# Patient Record
Sex: Female | Born: 1980 | Race: White | Hispanic: No | Smoking: Never smoker
Health system: Southern US, Community
[De-identification: ages and names within clinical notes are randomized; demographics above are authoritative.]

## PROBLEM LIST (undated history)

## (undated) ENCOUNTER — Inpatient Hospital Stay (HOSPITAL_COMMUNITY): Payer: Self-pay

## (undated) DIAGNOSIS — G43909 Migraine, unspecified, not intractable, without status migrainosus: Secondary | ICD-10-CM

## (undated) DIAGNOSIS — T7840XA Allergy, unspecified, initial encounter: Secondary | ICD-10-CM

## (undated) DIAGNOSIS — Z973 Presence of spectacles and contact lenses: Secondary | ICD-10-CM

## (undated) DIAGNOSIS — M23221 Derangement of posterior horn of medial meniscus due to old tear or injury, right knee: Secondary | ICD-10-CM

## (undated) DIAGNOSIS — M1990 Unspecified osteoarthritis, unspecified site: Secondary | ICD-10-CM

## (undated) DIAGNOSIS — J302 Other seasonal allergic rhinitis: Secondary | ICD-10-CM

## (undated) DIAGNOSIS — M2241 Chondromalacia patellae, right knee: Secondary | ICD-10-CM

## (undated) DIAGNOSIS — M6751 Plica syndrome, right knee: Secondary | ICD-10-CM

## (undated) HISTORY — DX: Unspecified osteoarthritis, unspecified site: M19.90

## (undated) HISTORY — DX: Allergy, unspecified, initial encounter: T78.40XA

## (undated) HISTORY — PX: TUBAL LIGATION: SHX77

## (undated) HISTORY — PX: WISDOM TOOTH EXTRACTION: SHX21

## (undated) HISTORY — PX: TONSILLECTOMY AND ADENOIDECTOMY: SUR1326

---
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
## 2000-02-01 ENCOUNTER — Emergency Department (HOSPITAL_COMMUNITY): Admission: EM | Admit: 2000-02-01 | Discharge: 2000-02-02 | Payer: Self-pay | Admitting: Emergency Medicine

## 2000-04-26 ENCOUNTER — Emergency Department (HOSPITAL_COMMUNITY): Admission: EM | Admit: 2000-04-26 | Discharge: 2000-04-26 | Payer: Self-pay | Admitting: Emergency Medicine

## 2000-04-26 ENCOUNTER — Encounter: Payer: Self-pay | Admitting: Emergency Medicine

## 2000-06-28 ENCOUNTER — Emergency Department (HOSPITAL_COMMUNITY): Admission: EM | Admit: 2000-06-28 | Discharge: 2000-06-28 | Payer: Self-pay

## 2000-09-09 ENCOUNTER — Encounter: Payer: Self-pay | Admitting: Emergency Medicine

## 2000-09-09 ENCOUNTER — Emergency Department (HOSPITAL_COMMUNITY): Admission: EM | Admit: 2000-09-09 | Discharge: 2000-09-09 | Payer: Self-pay | Admitting: Emergency Medicine

## 2001-02-14 ENCOUNTER — Other Ambulatory Visit: Admission: RE | Admit: 2001-02-14 | Discharge: 2001-02-14 | Payer: Self-pay | Admitting: Internal Medicine

## 2001-11-30 ENCOUNTER — Emergency Department (HOSPITAL_COMMUNITY): Admission: EM | Admit: 2001-11-30 | Discharge: 2001-12-01 | Payer: Self-pay | Admitting: Emergency Medicine

## 2001-12-01 ENCOUNTER — Encounter: Payer: Self-pay | Admitting: Emergency Medicine

## 2002-12-27 ENCOUNTER — Emergency Department (HOSPITAL_COMMUNITY): Admission: EM | Admit: 2002-12-27 | Discharge: 2002-12-27 | Payer: Self-pay | Admitting: Emergency Medicine

## 2003-01-10 ENCOUNTER — Emergency Department (HOSPITAL_COMMUNITY): Admission: EM | Admit: 2003-01-10 | Discharge: 2003-01-10 | Payer: Self-pay | Admitting: Emergency Medicine

## 2003-07-02 ENCOUNTER — Emergency Department (HOSPITAL_COMMUNITY): Admission: EM | Admit: 2003-07-02 | Discharge: 2003-07-02 | Payer: Self-pay | Admitting: Emergency Medicine

## 2003-07-17 ENCOUNTER — Emergency Department (HOSPITAL_COMMUNITY): Admission: EM | Admit: 2003-07-17 | Discharge: 2003-07-17 | Payer: Self-pay | Admitting: Emergency Medicine

## 2003-09-29 ENCOUNTER — Emergency Department (HOSPITAL_COMMUNITY): Admission: EM | Admit: 2003-09-29 | Discharge: 2003-09-29 | Payer: Self-pay | Admitting: Emergency Medicine

## 2003-11-12 HISTORY — PX: CHOLECYSTECTOMY: SHX55

## 2003-11-25 ENCOUNTER — Observation Stay (HOSPITAL_COMMUNITY): Admission: RE | Admit: 2003-11-25 | Discharge: 2003-11-26 | Payer: Self-pay | Admitting: General Surgery

## 2010-10-23 ENCOUNTER — Other Ambulatory Visit: Payer: Self-pay | Admitting: Obstetrics & Gynecology

## 2011-03-16 ENCOUNTER — Other Ambulatory Visit: Payer: Self-pay | Admitting: Obstetrics & Gynecology

## 2013-04-03 ENCOUNTER — Other Ambulatory Visit: Payer: Self-pay | Admitting: Obstetrics & Gynecology

## 2013-08-24 ENCOUNTER — Ambulatory Visit
Admission: RE | Admit: 2013-08-24 | Discharge: 2013-08-24 | Disposition: A | Discharge status: Home / Self Care | Payer: PRIVATE HEALTH INSURANCE | Source: Ambulatory Visit | Attending: Emergency Medicine | Admitting: Emergency Medicine

## 2013-08-24 ENCOUNTER — Other Ambulatory Visit: Payer: Self-pay | Admitting: Emergency Medicine

## 2013-08-24 ENCOUNTER — Other Ambulatory Visit: Payer: Self-pay

## 2013-08-24 DIAGNOSIS — S0003XA Contusion of scalp, initial encounter: Secondary | ICD-10-CM

## 2013-10-03 ENCOUNTER — Other Ambulatory Visit: Payer: Self-pay | Admitting: Sports Medicine

## 2013-10-03 DIAGNOSIS — M25561 Pain in right knee: Secondary | ICD-10-CM

## 2013-10-04 ENCOUNTER — Ambulatory Visit
Admission: RE | Admit: 2013-10-04 | Discharge: 2013-10-04 | Disposition: A | Discharge status: Home / Self Care | Payer: PRIVATE HEALTH INSURANCE | Source: Ambulatory Visit | Attending: Sports Medicine | Admitting: Sports Medicine

## 2013-10-04 DIAGNOSIS — M25561 Pain in right knee: Secondary | ICD-10-CM

## 2013-11-20 ENCOUNTER — Ambulatory Visit: Payer: Self-pay | Admitting: Emergency Medicine

## 2013-11-20 LAB — PREGNANCY, URINE: PREGNANCY TEST, URINE: NEGATIVE m[IU]/mL

## 2014-01-02 ENCOUNTER — Ambulatory Visit: Payer: Self-pay | Admitting: Physician Assistant

## 2014-01-02 LAB — CBC WITH DIFFERENTIAL/PLATELET
Basophil #: 0.1 10*3/uL (ref 0.0–0.1)
Basophil %: 0.9 %
Eosinophil #: 0.4 10*3/uL (ref 0.0–0.7)
Eosinophil %: 4 %
HCT: 43.6 % (ref 35.0–47.0)
HGB: 14.5 g/dL (ref 12.0–16.0)
LYMPHS PCT: 25.8 %
Lymphocyte #: 2.4 10*3/uL (ref 1.0–3.6)
MCH: 29.9 pg (ref 26.0–34.0)
MCHC: 33.3 g/dL (ref 32.0–36.0)
MCV: 90 fL (ref 80–100)
MONO ABS: 0.7 x10 3/mm (ref 0.2–0.9)
Monocyte %: 7.8 %
Neutrophil #: 5.9 10*3/uL (ref 1.4–6.5)
Neutrophil %: 61.5 %
PLATELETS: 280 10*3/uL (ref 150–440)
RBC: 4.86 10*6/uL (ref 3.80–5.20)
RDW: 12.7 % (ref 11.5–14.5)
WBC: 9.5 10*3/uL (ref 3.6–11.0)

## 2014-01-02 LAB — URINALYSIS, COMPLETE
BILIRUBIN, UR: NEGATIVE
Blood: NEGATIVE
GLUCOSE, UR: NEGATIVE mg/dL (ref 0–75)
KETONE: NEGATIVE
Nitrite: POSITIVE
Ph: 6 (ref 4.5–8.0)
RBC, UR: NONE SEEN /HPF (ref 0–5)
SPECIFIC GRAVITY: 1.02 (ref 1.003–1.030)

## 2014-01-02 LAB — COMPREHENSIVE METABOLIC PANEL
ALT: 18 U/L (ref 12–78)
Albumin: 3.4 g/dL (ref 3.4–5.0)
Alkaline Phosphatase: 43 U/L — ABNORMAL LOW
Anion Gap: 9 (ref 7–16)
BILIRUBIN TOTAL: 0.5 mg/dL (ref 0.2–1.0)
BUN: 9 mg/dL (ref 7–18)
CO2: 26 mmol/L (ref 21–32)
Calcium, Total: 8.8 mg/dL (ref 8.5–10.1)
Chloride: 102 mmol/L (ref 98–107)
Creatinine: 0.81 mg/dL (ref 0.60–1.30)
EGFR (African American): >60
EGFR (Non-African Amer.): >60
Glucose: 105 mg/dL — ABNORMAL HIGH (ref 65–99)
Osmolality: 273 (ref 275–301)
Potassium: 3.9 mmol/L (ref 3.5–5.1)
SGOT(AST): 14 U/L — ABNORMAL LOW (ref 15–37)
Sodium: 137 mmol/L (ref 136–145)
TOTAL PROTEIN: 7.3 g/dL (ref 6.4–8.2)

## 2014-01-02 LAB — LIPASE, BLOOD: LIPASE: 164 U/L (ref 73–393)

## 2014-01-02 LAB — PREGNANCY, URINE: Pregnancy Test, Urine: NEGATIVE m[IU]/mL

## 2014-01-02 LAB — AMYLASE: AMYLASE: 53 U/L (ref 25–115)

## 2014-01-04 LAB — URINE CULTURE

## 2014-04-01 ENCOUNTER — Encounter (HOSPITAL_COMMUNITY): Payer: Self-pay | Admitting: Emergency Medicine

## 2014-04-01 DIAGNOSIS — Z9089 Acquired absence of other organs: Secondary | ICD-10-CM | POA: Insufficient documentation

## 2014-04-01 DIAGNOSIS — R1012 Left upper quadrant pain: Secondary | ICD-10-CM | POA: Insufficient documentation

## 2014-04-01 DIAGNOSIS — E860 Dehydration: Secondary | ICD-10-CM | POA: Insufficient documentation

## 2014-04-01 DIAGNOSIS — Z79899 Other long term (current) drug therapy: Secondary | ICD-10-CM | POA: Insufficient documentation

## 2014-04-01 DIAGNOSIS — Z791 Long term (current) use of non-steroidal anti-inflammatories (NSAID): Secondary | ICD-10-CM | POA: Insufficient documentation

## 2014-04-01 DIAGNOSIS — R1013 Epigastric pain: Secondary | ICD-10-CM | POA: Insufficient documentation

## 2014-04-01 DIAGNOSIS — Z3202 Encounter for pregnancy test, result negative: Secondary | ICD-10-CM | POA: Insufficient documentation

## 2014-04-01 DIAGNOSIS — Z87891 Personal history of nicotine dependence: Secondary | ICD-10-CM | POA: Insufficient documentation

## 2014-04-01 DIAGNOSIS — Z880 Allergy status to penicillin: Secondary | ICD-10-CM | POA: Insufficient documentation

## 2014-04-01 LAB — URINALYSIS, ROUTINE W REFLEX MICROSCOPIC
Bilirubin Urine: NEGATIVE
Glucose, UA: NEGATIVE mg/dL
Hgb urine dipstick: NEGATIVE
Ketones, ur: NEGATIVE mg/dL
Leukocytes, UA: NEGATIVE
Nitrite: NEGATIVE
PROTEIN: NEGATIVE mg/dL
SPECIFIC GRAVITY, URINE: 1.028 (ref 1.005–1.030)
Urobilinogen, UA: 0.2 mg/dL (ref 0.0–1.0)
pH: 6 (ref 5.0–8.0)

## 2014-04-01 LAB — COMPREHENSIVE METABOLIC PANEL
ALT: 11 U/L (ref 0–35)
AST: 16 U/L (ref 0–37)
Albumin: 3.2 g/dL — ABNORMAL LOW (ref 3.5–5.2)
Alkaline Phosphatase: 42 U/L (ref 39–117)
Anion gap: 10 (ref 5–15)
BUN: 10 mg/dL (ref 6–23)
CO2: 25 mEq/L (ref 19–32)
CREATININE: 0.83 mg/dL (ref 0.50–1.10)
Calcium: 9.1 mg/dL (ref 8.4–10.5)
Chloride: 105 mEq/L (ref 96–112)
GFR calc Af Amer: >90 mL/min (ref >90)
GFR calc non Af Amer: >90 mL/min (ref >90)
Glucose, Bld: 107 mg/dL — ABNORMAL HIGH (ref 70–99)
POTASSIUM: 4.4 meq/L (ref 3.7–5.3)
Sodium: 140 mEq/L (ref 137–147)
Total Bilirubin: 0.2 mg/dL — ABNORMAL LOW (ref 0.3–1.2)
Total Protein: 6.9 g/dL (ref 6.0–8.3)

## 2014-04-01 LAB — CBC WITH DIFFERENTIAL/PLATELET
BASOS PCT: 0 % (ref 0–1)
Basophils Absolute: 0 10*3/uL (ref 0.0–0.1)
EOS PCT: 3 % (ref 0–5)
Eosinophils Absolute: 0.4 10*3/uL (ref 0.0–0.7)
HEMATOCRIT: 37.1 % (ref 36.0–46.0)
Hemoglobin: 12.3 g/dL (ref 12.0–15.0)
Lymphocytes Relative: 30 % (ref 12–46)
Lymphs Abs: 3.5 10*3/uL (ref 0.7–4.0)
MCH: 29.1 pg (ref 26.0–34.0)
MCHC: 33.2 g/dL (ref 30.0–36.0)
MCV: 87.7 fL (ref 78.0–100.0)
MONO ABS: 1 10*3/uL (ref 0.1–1.0)
Monocytes Relative: 8 % (ref 3–12)
Neutro Abs: 6.8 10*3/uL (ref 1.7–7.7)
Neutrophils Relative %: 59 % (ref 43–77)
Platelets: 272 10*3/uL (ref 150–400)
RBC: 4.23 MIL/uL (ref 3.87–5.11)
RDW: 12.8 % (ref 11.5–15.5)
WBC: 11.6 10*3/uL — AB (ref 4.0–10.5)

## 2014-04-01 LAB — LIPASE, BLOOD: LIPASE: 63 U/L — AB (ref 11–59)

## 2014-04-01 LAB — POC URINE PREG, ED: PREG TEST UR: NEGATIVE

## 2014-04-01 NOTE — ED Notes (Signed)
Pt denies any dysuria, hematuria, discharge or foul odors.

## 2014-04-01 NOTE — ED Notes (Signed)
Pt c/o abdominal pain, n/v for four days. Pt reports emesis x 2 and diarrhea for a week. LMP was six months ago with negative home pregnancy test.

## 2014-04-02 ENCOUNTER — Emergency Department (HOSPITAL_COMMUNITY)
Admission: EM | Admit: 2014-04-02 | Discharge: 2014-04-02 | Disposition: A | Discharge status: Home / Self Care | Payer: PRIVATE HEALTH INSURANCE | Attending: Emergency Medicine | Admitting: Emergency Medicine

## 2014-04-02 DIAGNOSIS — R1013 Epigastric pain: Secondary | ICD-10-CM

## 2014-04-02 MED ORDER — METRONIDAZOLE 500 MG PO TABS: 500.0000 mg | ORAL_TABLET | Freq: Two times a day (BID) | ORAL | Status: DC

## 2014-04-02 MED ORDER — ONDANSETRON 4 MG PO TBDP: 4.0000 mg | ORAL_TABLET | Freq: Three times a day (TID) | ORAL | Status: DC | PRN

## 2014-04-02 MED ORDER — CIPROFLOXACIN HCL 500 MG PO TABS: 500.0000 mg | ORAL_TABLET | Freq: Two times a day (BID) | ORAL | Status: DC

## 2014-04-02 MED ORDER — GI COCKTAIL ~~LOC~~
30.0000 mL | Freq: Once | ORAL | Status: AC
  Administered 2014-04-02: 30 mL via ORAL
  Filled 2014-04-02: qty 30

## 2014-04-02 MED ORDER — FAMOTIDINE 20 MG PO TABS: 20.0000 mg | ORAL_TABLET | Freq: Two times a day (BID) | ORAL | Status: DC

## 2014-04-02 MED ORDER — ONDANSETRON 4 MG PO TBDP
4.0000 mg | ORAL_TABLET | Freq: Once | ORAL | Status: AC
  Administered 2014-04-02: 4 mg via ORAL
  Filled 2014-04-02: qty 1

## 2014-04-02 NOTE — ED Notes (Signed)
Pt placed in gown and in bed. PT monitored by pulse ox, bp cuff, and 5-lead. 

## 2014-04-02 NOTE — ED Provider Notes (Signed)
CSN: [id number]     Arrival date & time 04/01/14  2224 History   First MD Initiated Contact with Patient 04/02/14 [phone]     Chief Complaint  Patient presents with  . Abdominal Pain     (Consider location/radiation/quality/duration/timing/severity/associated sxs/prior Treatment) HPI Comments: 33 year old female with a history of cholecystectomy 10 years ago and no other medical problems who reports that she has had abdominal pain with nausea for the last 3 or 4 days, this is gradually getting worse, this evening it became severe, she took some TUMS and had some improvement in her pain. She did have 2 episodes of vomiting and has had loose stools for the last week. She has not had a menstrual cycle in over 6 months but states that she has been on some other medication such as Zoloft which she has recently stopped taking. She is on Mobic frequently for aches and pains. She denies fevers, chills, cough, shortness of breath or back pain. She denies any vaginal discharge, dysuria or hematuria. This pain feels like a sharp pain like someone is "punching her in the stomach"  Patient is a 33 y.o. female presenting with abdominal pain. The history is provided by the patient and the spouse.  Abdominal Pain   History reviewed. No pertinent past medical history. Past Surgical History  Procedure Laterality Date  . Tonsillectomy    . Knee surgery    . Cholecystectomy    . Wisdom tooth extraction     History reviewed. No pertinent family history. History  Substance Use Topics  . Smoking status: Former Games developer  . Smokeless tobacco: Never Used  . Alcohol Use: Yes     Comment: holidays   OB History   Grav Para Term Preterm Abortions TAB SAB Ect Mult Living                 Review of Systems  Gastrointestinal: Positive for abdominal pain.  All other systems reviewed and are negative.     Allergies  Penicillins  Home Medications   Prior to Admission medications   Medication Sig Start Date  End Date Taking? Authorizing Provider  fexofenadine (ALLEGRA) 180 MG tablet Take 180 mg by mouth daily.   Yes Historical Provider, MD  meloxicam (MOBIC) 15 MG tablet Take 15 mg by mouth daily.   Yes Historical Provider, MD  Norethindrone-Mestranol (NECON 1/50, 28,) 1-50 MG-MCG tablet Take 1 tablet by mouth daily.   Yes Historical Provider, MD  ciprofloxacin (CIPRO) 500 MG tablet Take 1 tablet (500 mg total) by mouth every 12 (twelve) hours. 04/02/14   Vida Roller, MD  famotidine (PEPCID) 20 MG tablet Take 1 tablet (20 mg total) by mouth 2 (two) times daily. 04/02/14   Vida Roller, MD  metroNIDAZOLE (FLAGYL) 500 MG tablet Take 1 tablet (500 mg total) by mouth 2 (two) times daily. 04/02/14   Vida Roller, MD  ondansetron (ZOFRAN ODT) 4 MG disintegrating tablet Take 1 tablet (4 mg total) by mouth every 8 (eight) hours as needed for nausea. 04/02/14   Vida Roller, MD   BP 117/68  Pulse 65  Temp(Src) 98.7 F (37.1 C) (Oral)  Resp 15  Ht 5\' 6"  (1.676 m)  Wt 235 lb (106.595 kg)  BMI 37.95 kg/m2  SpO2 98%  LMP 10/22/2013 Physical Exam  Nursing note and vitals reviewed. Constitutional: She appears well-developed and well-nourished. No distress.  HENT:  Head: Normocephalic and atraumatic.  Mouth/Throat: Oropharynx is clear and moist. No oropharyngeal exudate.  Eyes: Conjunctivae and EOM are normal. Pupils are equal, round, and reactive to light. Right eye exhibits no discharge. Left eye exhibits no discharge. No scleral icterus.  Neck: Normal range of motion. Neck supple. No JVD present. No thyromegaly present.  Cardiovascular: Normal rate, regular rhythm, normal heart sounds and intact distal pulses.  Exam reveals no gallop and no friction rub.   No murmur heard. Pulmonary/Chest: Effort normal and breath sounds normal. No respiratory distress. She has no wheezes. She has no rales.  Abdominal: Soft. Bowel sounds are normal. She exhibits no distension and no mass. There is tenderness (mild  left upper quadrant and epigastric tenderness, no guarding, no peritoneal signs, no masses, very soft).  Musculoskeletal: Normal range of motion. She exhibits no edema and no tenderness.  Lymphadenopathy:    She has no cervical adenopathy.  Neurological: She is alert. Coordination normal.  Skin: Skin is warm and dry. No rash noted. No erythema.  Psychiatric: She has a normal mood and affect. Her behavior is normal.    ED Course  Procedures (including critical care time) Labs Review Labs Reviewed  CBC WITH DIFFERENTIAL - Abnormal; Notable for the following:    WBC 11.6 (*)    All other components within normal limits  COMPREHENSIVE METABOLIC PANEL - Abnormal; Notable for the following:    Glucose, Bld 107 (*)    Albumin 3.2 (*)    Total Bilirubin 0.2 (*)    All other components within normal limits  LIPASE, BLOOD - Abnormal; Notable for the following:    Lipase 63 (*)    All other components within normal limits  URINALYSIS, ROUTINE W REFLEX MICROSCOPIC  POC URINE PREG, ED    Imaging Review No results found.    MDM   Final diagnoses:  Epigastric pain    The patient states she is able to drink plenty of fluids and feels she is dehydrated, labs are unremarkable except for a slight leukocytosis of 11,600. Normal liver function tests, normal lipase, urinalysis without ketones or infection and not pregnant. Will try a GI cocktail, antiemetic, if still having significant pain may require CT scan though this would be low yield in the setting of a above labs values. She states that she has not wanted a CT scan here because of cost and would rather do it as an outpatient. She may have early colitis and would benefit from antibiotics.  The patient's symptoms have improved after GI cocktail, will start on Pepcid, we'll also treat for potential colitis causing her symptoms, she declined CT scan and can followup as an outpatient  Patient feels good enough to go home, states her pain is  improved   Meds given in ED:  Medications  gi cocktail (Maalox,Lidocaine,Donnatal) (30 mLs Oral Given 04/02/14 0253)  ondansetron (ZOFRAN-ODT) disintegrating tablet 4 mg (4 mg Oral Given 04/02/14 0253)    New Prescriptions   CIPROFLOXACIN (CIPRO) 500 MG TABLET    Take 1 tablet (500 mg total) by mouth every 12 (twelve) hours.   FAMOTIDINE (PEPCID) 20 MG TABLET    Take 1 tablet (20 mg total) by mouth 2 (two) times daily.   METRONIDAZOLE (FLAGYL) 500 MG TABLET    Take 1 tablet (500 mg total) by mouth 2 (two) times daily.   ONDANSETRON (ZOFRAN ODT) 4 MG DISINTEGRATING TABLET    Take 1 tablet (4 mg total) by mouth every 8 (eight) hours as needed for nausea.      Vida RollerBrian D Daleysa Kristiansen, MD 04/02/14 [id number]

## 2014-04-02 NOTE — ED Notes (Signed)
Family at bedside. 

## 2014-04-02 NOTE — ED Notes (Signed)
Pt's visitor came to the nurse's station stating that the pt is feeling better and would like to go home. Hyacinth MeekerMiller, MD is aware.

## 2014-04-09 ENCOUNTER — Other Ambulatory Visit: Payer: Self-pay | Admitting: Obstetrics & Gynecology

## 2014-04-10 ENCOUNTER — Ambulatory Visit (INDEPENDENT_AMBULATORY_CARE_PROVIDER_SITE_OTHER): Payer: PRIVATE HEALTH INSURANCE | Admitting: Internal Medicine

## 2014-04-10 ENCOUNTER — Encounter: Payer: Self-pay | Admitting: Internal Medicine

## 2014-04-10 VITALS — BP 126/74 | HR 97 | Temp 98.0°F | Ht 64.5 in | Wt 242.0 lb

## 2014-04-10 DIAGNOSIS — K2970 Gastritis, unspecified, without bleeding: Secondary | ICD-10-CM

## 2014-04-10 DIAGNOSIS — J3089 Other allergic rhinitis: Secondary | ICD-10-CM

## 2014-04-10 DIAGNOSIS — K2990 Gastroduodenitis, unspecified, without bleeding: Secondary | ICD-10-CM

## 2014-04-10 LAB — CYTOLOGY - PAP

## 2014-04-10 MED ORDER — FEXOFENADINE HCL 180 MG PO TABS: 180.0000 mg | ORAL_TABLET | Freq: Two times a day (BID) | ORAL | Status: DC

## 2014-04-10 MED ORDER — PANTOPRAZOLE SODIUM 40 MG PO TBEC: 40.0000 mg | DELAYED_RELEASE_TABLET | Freq: Every day | ORAL | Status: DC

## 2014-04-10 NOTE — Progress Notes (Signed)
HPI  Pt presents to the clinic today to establish care. She is transferring care from [facility].  Flu: never Tetanus: less than 10 years ago LMP: 09/2013 (continuous OCP) Pap Smear: 03/2014 Dentist: biannually  Frequent Headaches: once every few weeks  Epigastric Pain, started a few weeks ago. Was seen in the ER for the same. They gave her a GI cocktail which she was improved. They treated her with Cipro/Flagyl for possible colitis. She continues have the pain, nauuea and bloating at times. She is taking the pepcid. It does seem to be worse after spicy food. She is having normal BM.  Past Medical History  Diagnosis Date  . Chicken pox   . Allergy   . Frequent headaches     Current Outpatient Prescriptions  Medication Sig Dispense Refill  . ciprofloxacin (CIPRO) 500 MG tablet Take 1 tablet (500 mg total) by mouth every 12 (twelve) hours.  20 tablet  0  . famotidine (PEPCID) 20 MG tablet Take 1 tablet (20 mg total) by mouth 2 (two) times daily.  30 tablet  0  . fexofenadine (ALLEGRA) 180 MG tablet Take 180 mg by mouth daily.      . meloxicam (MOBIC) 15 MG tablet Take 15 mg by mouth daily.      . metroNIDAZOLE (FLAGYL) 500 MG tablet Take 1 tablet (500 mg total) by mouth 2 (two) times daily.  20 tablet  0  . Norethindrone-Mestranol (NECON 1/50, 28,) 1-50 MG-MCG tablet Take 1 tablet by mouth daily.      . ondansetron (ZOFRAN ODT) 4 MG disintegrating tablet Take 1 tablet (4 mg total) by mouth every 8 (eight) hours as needed for nausea.  10 tablet  0   No current facility-administered medications for this visit.    Allergies  Allergen Reactions  . Penicillins Anaphylaxis    Family History  Problem Relation Age of Onset  . Arthritis Mother   . Cancer Mother     Ovarian/uterine  . Hyperlipidemia Mother   . Hypertension Mother   . Arthritis Father   . Hypertension Father   . Asthma Father   . Asthma Sister   . Arthritis Paternal Grandmother     History   Social  History  . Marital Status: Divorced    Spouse Name: N/A    Number of Children: N/A  . Years of Education: N/A   Occupational History  . Not on file.   Social History Main Topics  . Smoking status: Never Smoker   . Smokeless tobacco: Never Used  . Alcohol Use: Yes     Comment: rare  . Drug Use: No  . Sexual Activity: Not on file   Other Topics Concern  . Not on file   Social History Narrative  . No narrative on file    ROS:  Constitutional: Denies fever, malaise, fatigue, headache or abrupt weight changes.  Respiratory: Denies difficulty breathing, shortness of breath, cough or sputum production.   Cardiovascular: Denies chest pain, chest tightness, palpitations or swelling in the hands or feet.  Gastrointestinal: Pt reports epigastric pain. Denies bloating, constipation, diarrhea or blood in the stool.   No other specific complaints in a complete review of systems (except as listed in HPI above).  PE:  BP 126/74  Pulse 97  Temp(Src) 98 F (36.7 C) (Oral)  Ht 5' 4.5" (1.638 m)  Wt 242 lb (109.77 kg)  BMI 40.91 kg/m2  SpO2 98%  LMP 10/22/2013 Wt Readings from Last 3 Encounters:  04/10/14  242 lb (109.77 kg)  04/01/14 235 lb (106.595 kg)    General: Appears her stated age, obese but well developed, well nourished in NAD. HEENT: Head: normal shape and size; Eyes: sclera white, no icterus, conjunctiva pink, PERRLA and EOMs intact; Ears: Tm's gray and intact, normal light reflex; Nose: mucosa pink and moist, septum midline; Throat/Mouth: Teeth present, mucosa pink and moist, no lesions or ulcerations noted.  Cardiovascular: Normal rate and rhythm. S1,S2 noted.  No murmur, rubs or gallops noted. No JVD or BLE edema. No carotid bruits noted. Pulmonary/Chest: Normal effort and positive vesicular breath sounds. No respiratory distress. No wheezes, rales or ronchi noted.  Abdomen: Soft and nontender. Normal bowel sounds, no bruits noted. No distention or masses noted. Liver,  spleen and kidneys non palpable.   BMET    Component Value Date/Time   NA 140 04/01/2014 2234   K 4.4 04/01/2014 2234   CL 105 04/01/2014 2234   CO2 25 04/01/2014 2234   GLUCOSE 107* 04/01/2014 2234   BUN 10 04/01/2014 2234   CREATININE 0.83 04/01/2014 2234   CALCIUM 9.1 04/01/2014 2234   GFRNONAA >90 04/01/2014 2234   GFRAA >90 04/01/2014 2234    Lipid Panel  No results found for this basename: chol, trig, hdl, cholhdl, vldl, ldlcalc    CBC    Component Value Date/Time   WBC 11.6* 04/01/2014 2234   RBC 4.23 04/01/2014 2234   HGB 12.3 04/01/2014 2234   HCT 37.1 04/01/2014 2234   PLT 272 04/01/2014 2234   MCV 87.7 04/01/2014 2234   MCH 29.1 04/01/2014 2234   MCHC 33.2 04/01/2014 2234   RDW 12.8 04/01/2014 2234   LYMPHSABS 3.5 04/01/2014 2234   MONOABS 1.0 04/01/2014 2234   EOSABS 0.4 04/01/2014 2234   BASOSABS 0.0 04/01/2014 2234    Hgb A1C No results found for this basename: HGBA1C     Assessment and Plan:  Allergic Rhinitis:  Allegra refilled  Epigastric pain, ? Colitis:  Will switch from Pepcid to Protonix If no improvement in 2 weeks will obtain CT scan abdomen  RTC when convenient to set up a physical exam

## 2014-04-10 NOTE — Progress Notes (Signed)
Pre visit review using our clinic review tool, if applicable. No additional management support is needed unless otherwise documented below in the visit note. 

## 2014-04-10 NOTE — Patient Instructions (Addendum)

## 2014-04-11 ENCOUNTER — Telehealth: Payer: Self-pay | Admitting: *Deleted

## 2014-04-11 ENCOUNTER — Telehealth: Payer: Self-pay

## 2014-04-11 NOTE — Telephone Encounter (Signed)
i called number given from [facility] to initiate ZO--[id number]--JPA--[id number]--i was told they would fax the neccessary form needed for PA--waiting on fax

## 2014-04-11 NOTE — Telephone Encounter (Signed)
Pt left message with Triage: Pt was seen by Nicki Reaperegina Baity, NP yesterday and she refilled her Rxs to the wrong pharmacy, [facility] [address] [facility]. When pt went to have [facility] Rxs [facility] [address] [facility] told [facility] that they can't fill pt's protonix until we do a PA on Rx, fax from pharmacy received and [facility], beside Triage, pt request call back once PA is done

## 2014-04-12 NOTE — Telephone Encounter (Signed)
PA form was faxed to [facility] [id number]--[id number]--phone # is [phone]--waiting on response

## 2014-04-22 NOTE — Telephone Encounter (Signed)
Received notification of approval on Protonix approved x 1 year from 04/16/2014--left detailed msg per HIPAA on  VM letting pt know Rx was approved

## 2014-05-21 ENCOUNTER — Encounter: Payer: Self-pay | Admitting: Internal Medicine

## 2014-05-21 ENCOUNTER — Ambulatory Visit (INDEPENDENT_AMBULATORY_CARE_PROVIDER_SITE_OTHER): Payer: PRIVATE HEALTH INSURANCE | Admitting: Internal Medicine

## 2014-05-21 VITALS — BP 120/82 | HR 88 | Temp 98.1°F | Wt 240.2 lb

## 2014-05-21 DIAGNOSIS — J309 Allergic rhinitis, unspecified: Secondary | ICD-10-CM

## 2014-05-21 MED ORDER — PREDNISONE 10 MG PO TABS: ORAL_TABLET | ORAL | Status: DC

## 2014-05-21 NOTE — Progress Notes (Signed)
Subjective:    Patient ID: Mikayla Briggs, female    DOB: [birth year], 33 y.o.   MRN: [id number]  HPI  Patient presents with worsening allergy symptoms for the past three weeks. She reports difficulty breathing   She has taken Aleve D with some relief and has used her inhaler every night with some relief for the past week.  Review of Systems      Past Medical History  Diagnosis Date  . Chicken pox   . Allergy   . Frequent headaches     Current Outpatient Prescriptions  Medication Sig Dispense Refill  . fexofenadine (ALLEGRA) 180 MG tablet Take 1 tablet (180 mg total) by mouth 2 (two) times daily.  100 tablet  2  . meloxicam (MOBIC) 15 MG tablet Take 15 mg by mouth daily.      . Norethindrone-Mestranol (NECON 1/50, 28,) 1-50 MG-MCG tablet Take 1 tablet by mouth daily.       No current facility-administered medications for this visit.    Allergies  Allergen Reactions  . Penicillins Anaphylaxis    Family History  Problem Relation Age of Onset  . Arthritis Mother   . Cancer Mother     Ovarian/uterine  . Hyperlipidemia Mother   . Hypertension Mother   . Arthritis Father   . Hypertension Father   . Asthma Father   . Asthma Sister   . Arthritis Paternal Grandmother     History   Social History  . Marital Status: Divorced    Spouse Name: N/A    Number of Children: N/A  . Years of Education: N/A   Occupational History  . Not on file.   Social History Main Topics  . Smoking status: Never Smoker   . Smokeless tobacco: Never Used  . Alcohol Use: Yes     Comment: rare  . Drug Use: No  . Sexual Activity: Yes   Other Topics Concern  . Not on file   Social History Narrative  . No narrative on file     Constitutional: Denies fever, malaise, fatigue, headache or abrupt weight changes.  HEENT: Denies eye pain, eye redness, ear pain, ringing in the ears, wax buildup, runny nose, bloody nose, or sore throat. Respiratory: Denies shortness of breath, cough or  sputum production.   Cardiovascular: Denies chest pain, No other specific complaints in a complete review of systems (except as listed in HPI above).  Objective:   Physical Exam  BP 120/82  Pulse 88  Temp(Src) 98.1 F (36.7 C) (Oral)  Wt 240 lb 4 oz (108.977 kg)  SpO2 98% Wt Readings from Last 3 Encounters:  05/21/14 240 lb 4 oz (108.977 kg)  04/10/14 242 lb (109.77 kg)  04/01/14 235 lb (106.595 kg)    General: Appears their stated age, well developed, well nourished in NAD. HEENT: Post nasal drip noted. Ears: Tm's gray and intact, normal light reflex; Nose: mucosa pink and moist, septum midline; Throat/Mouth: Teeth present, mucosa pink and moist, no exudate, lesions or ulcerations noted.  Cardiovascular: Normal rate and rhythm. S1,S2 noted.  No murmur, rubs or gallops noted. Pulmonary/Chest: Normal effort and positive vesicular breath sounds. No respiratory distress. Inspiratory wheezes noted in right upper lung   BMET    Component Value Date/Time   NA 140 04/01/2014 2234   K 4.4 04/01/2014 2234   CL 105 04/01/2014 2234   CO2 25 04/01/2014 2234   GLUCOSE 107* 04/01/2014 2234   BUN 10 04/01/2014 2234   CREATININE 0.83  04/01/2014 2234   CALCIUM 9.1 04/01/2014 2234   GFRNONAA >90 04/01/2014 2234   GFRAA >90 04/01/2014 2234    Lipid Panel  No results found for this basename: chol, trig, hdl, cholhdl, vldl, ldlcalc    CBC    Component Value Date/Time   WBC 11.6* 04/01/2014 2234   RBC 4.23 04/01/2014 2234   HGB 12.3 04/01/2014 2234   HCT 37.1 04/01/2014 2234   PLT 272 04/01/2014 2234   MCV 87.7 04/01/2014 2234   MCH 29.1 04/01/2014 2234   MCHC 33.2 04/01/2014 2234   RDW 12.8 04/01/2014 2234   LYMPHSABS 3.5 04/01/2014 2234   MONOABS 1.0 04/01/2014 2234   EOSABS 0.4 04/01/2014 2234   BASOSABS 0.0 04/01/2014 2234    Hgb A1C No results found for this basename: HGBA1C         Assessment & Plan:  Allergic Rhinitis Patient was given  80 mg Depo-Medro  IM  in the office Prednisone  taper pack Please call the office if your symptoms worsen or do not improve

## 2014-05-21 NOTE — Patient Instructions (Addendum)

## 2014-05-21 NOTE — Progress Notes (Signed)
Pre visit review using our clinic review tool, if applicable. No additional management support is needed unless otherwise documented below in the visit note. 

## 2014-05-21 NOTE — Progress Notes (Signed)
Subjective:    Patient ID: Mikayla Briggs, female    DOB: 02/26/1981, 33 y.o.   MRN: [id number]  HPI  Pt presents to the clinic today with c/o seasonal allergies. This started 3 weeks ago. She c/o nasal and chest congestion. She denies cough but has had chest congestion. She denies fever, chills or body aches. She is not blowing colored mucous out of her nose. She is taking Allegra daily. She has also taken Aleve D and her inhaler. She reports that usually when she has a flare of her allergies, she will get relief from a Kenalog injection and prednisone taper.  Review of Systems      Past Medical History  Diagnosis Date  . Chicken pox   . Allergy   . Frequent headaches     Current Outpatient Prescriptions  Medication Sig Dispense Refill  . fexofenadine (ALLEGRA) 180 MG tablet Take 1 tablet (180 mg total) by mouth 2 (two) times daily.  100 tablet  2  . meloxicam (MOBIC) 15 MG tablet Take 15 mg by mouth daily.      . metroNIDAZOLE (FLAGYL) 500 MG tablet Take 1 tablet (500 mg total) by mouth 2 (two) times daily.  20 tablet  0  . Norethindrone-Mestranol (NECON 1/50, 28,) 1-50 MG-MCG tablet Take 1 tablet by mouth daily.      . ondansetron (ZOFRAN ODT) 4 MG disintegrating tablet Take 1 tablet (4 mg total) by mouth every 8 (eight) hours as needed for nausea.  10 tablet  0  . pantoprazole (PROTONIX) 40 MG tablet Take 1 tablet (40 mg total) by mouth daily.  30 tablet  3   No current facility-administered medications for this visit.    Allergies  Allergen Reactions  . Penicillins Anaphylaxis    Family History  Problem Relation Age of Onset  . Arthritis Mother   . Cancer Mother     Ovarian/uterine  . Hyperlipidemia Mother   . Hypertension Mother   . Arthritis Father   . Hypertension Father   . Asthma Father   . Asthma Sister   . Arthritis Paternal Grandmother     History   Social History  . Marital Status: Divorced    Spouse Name: N/A    Number of Children: N/A  .  Years of Education: N/A   Occupational History  . Not on file.   Social History Main Topics  . Smoking status: Never Smoker   . Smokeless tobacco: Never Used  . Alcohol Use: Yes     Comment: rare  . Drug Use: No  . Sexual Activity: Yes   Other Topics Concern  . Not on file   Social History Narrative  . No narrative on file     Constitutional: Denies fever, malaise, fatigue, headache or abrupt weight changes.  HEENT: Pt reports nasal congestion. Denies eye pain, eye redness, ear pain, ringing in the ears, wax buildup, runny nose, bloody nose, or sore throat. Respiratory: Denies difficulty breathing, shortness of breath, cough or sputum production.    No other specific complaints in a complete review of systems (except as listed in HPI above).  Objective:   Physical Exam   BP 120/82  Pulse 88  Temp(Src) 98.1 F (36.7 C) (Oral)  Wt 240 lb 4 oz (108.977 kg)  SpO2 98% Wt Readings from Last 3 Encounters:  05/21/14 240 lb 4 oz (108.977 kg)  04/10/14 242 lb (109.77 kg)  04/01/14 235 lb (106.595 kg)    General: Appears her  stated age, obese but well developed, well nourished in NAD. HEENT: Ears: Tm's pink and intact, normal light reflex; Nose: mucosa boggy and moist, septum midline; Throat/Mouth: Teeth present,+ PND, mucosa pink and moist, no exudate, lesions or ulcerations noted.   Cardiovascular: Normal rate and rhythm. S1,S2 noted.  No murmur, rubs or gallops noted.  Pulmonary/Chest: Normal effort and positive vesicular breath sounds. No respiratory distress. No wheezes, rales or ronchi noted.    BMET    Component Value Date/Time   NA 140 04/01/2014 2234   K 4.4 04/01/2014 2234   CL 105 04/01/2014 2234   CO2 25 04/01/2014 2234   GLUCOSE 107* 04/01/2014 2234   BUN 10 04/01/2014 2234   CREATININE 0.83 04/01/2014 2234   CALCIUM 9.1 04/01/2014 2234   GFRNONAA >90 04/01/2014 2234   GFRAA >90 04/01/2014 2234    Lipid Panel  No results found for this basename: chol, trig,  hdl, cholhdl, vldl, ldlcalc    CBC    Component Value Date/Time   WBC 11.6* 04/01/2014 2234   RBC 4.23 04/01/2014 2234   HGB 12.3 04/01/2014 2234   HCT 37.1 04/01/2014 2234   PLT 272 04/01/2014 2234   MCV 87.7 04/01/2014 2234   MCH 29.1 04/01/2014 2234   MCHC 33.2 04/01/2014 2234   RDW 12.8 04/01/2014 2234   LYMPHSABS 3.5 04/01/2014 2234   MONOABS 1.0 04/01/2014 2234   EOSABS 0.4 04/01/2014 2234   BASOSABS 0.0 04/01/2014 2234    Hgb A1C No results found for this basename: HGBA1C        Assessment & Plan:   Allergic Rhinitis:  80 mg Depo IM today eRx for pred taper Advised her to try flonase or nasocort in addition to her allegra daily  RTC as needed or if symptoms persist or worsen

## 2014-05-23 ENCOUNTER — Ambulatory Visit: Payer: PRIVATE HEALTH INSURANCE | Admitting: Family Medicine

## 2014-06-11 ENCOUNTER — Encounter: Payer: Self-pay | Admitting: Internal Medicine

## 2014-06-11 ENCOUNTER — Ambulatory Visit (INDEPENDENT_AMBULATORY_CARE_PROVIDER_SITE_OTHER): Payer: PRIVATE HEALTH INSURANCE | Admitting: Internal Medicine

## 2014-06-11 VITALS — BP 122/80 | HR 98 | Temp 98.3°F | Wt 245.0 lb

## 2014-06-11 DIAGNOSIS — S139XXA Sprain of joints and ligaments of unspecified parts of neck, initial encounter: Secondary | ICD-10-CM

## 2014-06-11 DIAGNOSIS — S161XXA Strain of muscle, fascia and tendon at neck level, initial encounter: Secondary | ICD-10-CM

## 2014-06-11 MED ORDER — TRAMADOL HCL 50 MG PO TABS: 50.0000 mg | ORAL_TABLET | Freq: Three times a day (TID) | ORAL | Status: DC | PRN

## 2014-06-11 NOTE — Progress Notes (Signed)
Subjective:    Patient ID: Mikayla EagleLauren M Allmond, female    DOB: Aug 22, 1981, 33 y.o.   MRN: [id number]  HPI  Pt presents to the clinic today with c/o a headache in the back of her head. She reports this started 3-4 days ago. She describes the pain as achy, but it can sometimes be sharp and shooting, radiating to the front of her head. This typically occurs when she stands up. It also seems to be worse when she bends her head down. She denies dizziness, blurred vision, numbness and tingling or sensitivitiy to light and sound. She has had some associated nausea but denies vomiting. She denies any head injury. She has tried excedrin OTC without any relief. She is very concerned because she has never had a headache like this in the past.  Review of Systems      Past Medical History  Diagnosis Date  . Chicken pox   . Allergy   . Frequent headaches     Current Outpatient Prescriptions  Medication Sig Dispense Refill  . fexofenadine (ALLEGRA) 180 MG tablet Take 1 tablet (180 mg total) by mouth 2 (two) times daily.  100 tablet  2  . meloxicam (MOBIC) 15 MG tablet Take 15 mg by mouth daily.      . Norethindrone-Mestranol (NECON 1/50, 28,) 1-50 MG-MCG tablet Take 1 tablet by mouth daily.      . predniSONE (DELTASONE) 10 MG tablet Take three tablets on days 1 and 2, 2 tablets on days 3 and 4 and 1 tab on days 5 and 6  12 tablet  0   No current facility-administered medications for this visit.    Allergies  Allergen Reactions  . Penicillins Anaphylaxis    Family History  Problem Relation Age of Onset  . Arthritis Mother   . Cancer Mother     Ovarian/uterine  . Hyperlipidemia Mother   . Hypertension Mother   . Arthritis Father   . Hypertension Father   . Asthma Father   . Asthma Sister   . Arthritis Paternal Grandmother     History   Social History  . Marital Status: Divorced    Spouse Name: N/A    Number of Children: N/A  . Years of Education: N/A   Occupational History  .  Not on file.   Social History Main Topics  . Smoking status: Never Smoker   . Smokeless tobacco: Never Used  . Alcohol Use: Yes     Comment: rare  . Drug Use: No  . Sexual Activity: Yes   Other Topics Concern  . Not on file   Social History Narrative  . No narrative on file     Constitutional: Pt reports headache. Denies fever, malaise, fatigue, or abrupt weight changes.  HEENT: Denies eye pain, eye redness, ear pain, ringing in the ears, wax buildup, runny nose, nasal congestion, bloody nose, or sore throat. Respiratory: Denies difficulty breathing, shortness of breath, cough or sputum production.   Cardiovascular: Denies chest pain, chest tightness, palpitations or swelling in the hands or feet.  Neurological: Denies dizziness, difficulty with memory, difficulty with speech or problems with balance and coordination.   No other specific complaints in a complete review of systems (except as listed in HPI above).  Objective:   Physical Exam   BP 122/80  Pulse 98  Temp(Src) 98.3 F (36.8 C)  Wt 245 lb (111.131 kg)  SpO2 98% Wt Readings from Last 3 Encounters:  06/11/14 245 lb (111.131 kg)  05/21/14 240 lb 4 oz (108.977 kg)  04/10/14 242 lb (109.77 kg)    General: Appears her stated age, obese but well developed, well nourished in NAD. HEENT: Head: normal shape and size; Eyes: sclera white, no icterus, conjunctiva pink, PERRLA and EOMs intact;  Neck: Normal extension and lateral rotation. Decreased flexion due to pain. Pain with palpation of the cervical spine. Strength 5/5 BUE. Cardiovascular: Normal rate and rhythm. S1,S2 noted.  No murmur, rubs or gallops noted.  Pulmonary/Chest: Normal effort and positive vesicular breath sounds. No respiratory distress. No wheezes, rales or ronchi noted.  Neurological: Alert and oriented. Cranial nerves II-XII grossly intact. Coordination normal.    BMET    Component Value Date/Time   NA 140 04/01/2014 2234   K 4.4 04/01/2014  2234   CL 105 04/01/2014 2234   CO2 25 04/01/2014 2234   GLUCOSE 107* 04/01/2014 2234   BUN 10 04/01/2014 2234   CREATININE 0.83 04/01/2014 2234   CALCIUM 9.1 04/01/2014 2234   GFRNONAA >90 04/01/2014 2234   GFRAA >90 04/01/2014 2234    Lipid Panel  No results found for this basename: chol, trig, hdl, cholhdl, vldl, ldlcalc    CBC    Component Value Date/Time   WBC 11.6* 04/01/2014 2234   RBC 4.23 04/01/2014 2234   HGB 12.3 04/01/2014 2234   HCT 37.1 04/01/2014 2234   PLT 272 04/01/2014 2234   MCV 87.7 04/01/2014 2234   MCH 29.1 04/01/2014 2234   MCHC 33.2 04/01/2014 2234   RDW 12.8 04/01/2014 2234   LYMPHSABS 3.5 04/01/2014 2234   MONOABS 1.0 04/01/2014 2234   EOSABS 0.4 04/01/2014 2234   BASOSABS 0.0 04/01/2014 2234    Hgb A1C No results found for this basename: HGBA1C        Assessment & Plan:   Cervical Strain:  Showed her some neck exercises I want her to do three times daily RX provided for tramadol TID prn for pain (do not take with Mobic) Try a heating pad to see if this helps  RTC in 1 week if symptoms persist, sooner if worsens

## 2014-06-11 NOTE — Patient Instructions (Addendum)
Cervical Strain and Sprain (Whiplash) with Rehab Cervical strain and sprain are injuries that commonly occur with "whiplash" injuries. Whiplash occurs when the neck is forcefully whipped backward or forward, such as during a motor vehicle accident or during contact sports. The muscles, ligaments, tendons, discs, and nerves of the neck are susceptible to injury when this occurs. RISK FACTORS Risk of having a whiplash injury increases if:  Osteoarthritis of the spine.  Situations that make head or neck accidents or trauma more likely.  High-risk sports (football, rugby, wrestling, hockey, auto racing, gymnastics, diving, contact karate, or boxing).  Poor strength and flexibility of the neck.  Previous neck injury.  Poor tackling technique.  Improperly fitted or padded equipment. SYMPTOMS   Pain or stiffness in the front or back of neck or both.  Symptoms may present immediately or up to 24 hours after injury.  Dizziness, headache, nausea, and vomiting.  Muscle spasm with soreness and stiffness in the neck.  Tenderness and swelling at the injury site. PREVENTION  Learn and use proper technique (avoid tackling with the head, spearing, and head-butting; use proper falling techniques to avoid landing on the head).  Warm up and stretch properly before activity.  Maintain physical fitness:  Strength, flexibility, and endurance.  Cardiovascular fitness.  Wear properly fitted and padded protective equipment, such as padded soft collars, for participation in contact sports. PROGNOSIS  Recovery from cervical strain and sprain injuries is dependent on the extent of the injury. These injuries are usually curable in 1 week to 3 months with appropriate treatment.  RELATED COMPLICATIONS   Temporary numbness and weakness may occur if the nerve roots are damaged, and this may persist until the nerve has completely healed.  Chronic pain due to frequent recurrence of  symptoms.  Prolonged healing, especially if activity is resumed too soon (before complete recovery). TREATMENT  Treatment initially involves the use of ice and medication to help reduce pain and inflammation. It is also important to perform strengthening and stretching exercises and modify activities that worsen symptoms so the injury does not get worse. These exercises may be performed at home or with a therapist. For patients who experience severe symptoms, a soft, padded collar may be recommended to be worn around the neck.  Improving your posture may help reduce symptoms. Posture improvement includes pulling your chin and abdomen in while sitting or standing. If you are sitting, sit in a firm chair with your buttocks against the back of the chair. While sleeping, try replacing your pillow with a small towel rolled to 2 inches in diameter, or use a cervical pillow or soft cervical collar. Poor sleeping positions delay healing.  For patients with nerve root damage, which causes numbness or weakness, the use of a cervical traction apparatus may be recommended. Surgery is rarely necessary for these injuries. However, cervical strain and sprains that are present at birth (congenital) may require surgery. MEDICATION   If pain medication is necessary, nonsteroidal anti-inflammatory medications, such as aspirin and ibuprofen, or other minor pain relievers, such as acetaminophen, are often recommended.  Do not take pain medication for 7 days before surgery.  Prescription pain relievers may be given if deemed necessary by your caregiver. Use only as directed and only as much as you need. HEAT AND COLD:   Cold treatment (icing) relieves pain and reduces inflammation. Cold treatment should be applied for 10 to 15 minutes every 2 to 3 hours for inflammation and pain and immediately after any activity that aggravates   your symptoms. Use ice packs or an ice massage.  Heat treatment may be used prior to  performing the stretching and strengthening activities prescribed by your caregiver, physical therapist, or athletic trainer. Use a heat pack or a warm soak. SEEK MEDICAL CARE IF:   Symptoms get worse or do not improve in 2 weeks despite treatment.  New, unexplained symptoms develop (drugs used in treatment may produce side effects). EXERCISES RANGE OF MOTION (ROM) AND STRETCHING EXERCISES - Cervical Strain and Sprain These exercises may help you when beginning to rehabilitate your injury. In order to successfully resolve your symptoms, you must improve your posture. These exercises are designed to help reduce the forward-head and rounded-shoulder posture which contributes to this condition. Your symptoms may resolve with or without further involvement from your physician, physical therapist or athletic trainer. While completing these exercises, remember:   Restoring tissue flexibility helps normal motion to return to the joints. This allows healthier, less painful movement and activity.  An effective stretch should be held for at least 20 seconds, although you may need to begin with shorter hold times for comfort.  A stretch should never be painful. You should only feel a gentle lengthening or release in the stretched tissue. STRETCH- Axial Extensors  Lie on your back on the floor. You may bend your knees for comfort. Place a rolled-up hand towel or dish towel, about 2 inches in diameter, under the part of your head that makes contact with the floor.  Gently tuck your chin, as if trying to make a "double chin," until you feel a gentle stretch at the base of your head.  Hold __________ seconds. Repeat __________ times. Complete this exercise __________ times per day.  STRETCH - Axial Extension   Stand or sit on a firm surface. Assume a good posture: chest up, shoulders drawn back, abdominal muscles slightly tense, knees unlocked (if standing) and feet hip width apart.  Slowly retract your  chin so your head slides back and your chin slightly lowers. Continue to look straight ahead.  You should feel a gentle stretch in the back of your head. Be certain not to feel an aggressive stretch since this can cause headaches later.  Hold for __________ seconds. Repeat __________ times. Complete this exercise __________ times per day. STRETCH - Cervical Side Bend   Stand or sit on a firm surface. Assume a good posture: chest up, shoulders drawn back, abdominal muscles slightly tense, knees unlocked (if standing) and feet hip width apart.  Without letting your nose or shoulders move, slowly tip your right / left ear to your shoulder until your feel a gentle stretch in the muscles on the opposite side of your neck.  Hold __________ seconds. Repeat __________ times. Complete this exercise __________ times per day. STRETCH - Cervical Rotators   Stand or sit on a firm surface. Assume a good posture: chest up, shoulders drawn back, abdominal muscles slightly tense, knees unlocked (if standing) and feet hip width apart.  Keeping your eyes level with the ground, slowly turn your head until you feel a gentle stretch along the back and opposite side of your neck.  Hold __________ seconds. Repeat __________ times. Complete this exercise __________ times per day. RANGE OF MOTION - Neck Circles   Stand or sit on a firm surface. Assume a good posture: chest up, shoulders drawn back, abdominal muscles slightly tense, knees unlocked (if standing) and feet hip width apart.  Gently roll your head down and around from the   back of one shoulder to the back of the other. The motion should never be forced or painful.  Repeat the motion 10-20 times, or until you feel the neck muscles relax and loosen. Repeat __________ times. Complete the exercise __________ times per day. STRENGTHENING EXERCISES - Cervical Strain and Sprain These exercises may help you when beginning to rehabilitate your injury. They may  resolve your symptoms with or without further involvement from your physician, physical therapist, or athletic trainer. While completing these exercises, remember:   Muscles can gain both the endurance and the strength needed for everyday activities through controlled exercises.  Complete these exercises as instructed by your physician, physical therapist, or athletic trainer. Progress the resistance and repetitions only as guided.  You may experience muscle soreness or fatigue, but the pain or discomfort you are trying to eliminate should never worsen during these exercises. If this pain does worsen, stop and make certain you are following the directions exactly. If the pain is still present after adjustments, discontinue the exercise until you can discuss the trouble with your clinician. STRENGTH - Cervical Flexors, Isometric  Face a wall, standing about 6 inches away. Place a small pillow, a ball about 6-8 inches in diameter, or a folded towel between your forehead and the wall.  Slightly tuck your chin and gently push your forehead into the soft object. Push only with mild to moderate intensity, building up tension gradually. Keep your jaw and forehead relaxed.  Hold 10 to 20 seconds. Keep your breathing relaxed.  Release the tension slowly. Relax your neck muscles completely before you start the next repetition. Repeat __________ times. Complete this exercise __________ times per day. STRENGTH- Cervical Lateral Flexors, Isometric   Stand about 6 inches away from a wall. Place a small pillow, a ball about 6-8 inches in diameter, or a folded towel between the side of your head and the wall.  Slightly tuck your chin and gently tilt your head into the soft object. Push only with mild to moderate intensity, building up tension gradually. Keep your jaw and forehead relaxed.  Hold 10 to 20 seconds. Keep your breathing relaxed.  Release the tension slowly. Relax your neck muscles completely  before you start the next repetition. Repeat __________ times. Complete this exercise __________ times per day. STRENGTH - Cervical Extensors, Isometric   Stand about 6 inches away from a wall. Place a small pillow, a ball about 6-8 inches in diameter, or a folded towel between the back of your head and the wall.  Slightly tuck your chin and gently tilt your head back into the soft object. Push only with mild to moderate intensity, building up tension gradually. Keep your jaw and forehead relaxed.  Hold 10 to 20 seconds. Keep your breathing relaxed.  Release the tension slowly. Relax your neck muscles completely before you start the next repetition. Repeat __________ times. Complete this exercise __________ times per day. POSTURE AND BODY MECHANICS CONSIDERATIONS - Cervical Strain and Sprain Keeping correct posture when sitting, standing or completing your activities will reduce the stress put on different body tissues, allowing injured tissues a chance to heal and limiting painful experiences. The following are general guidelines for improved posture. Your physician or physical therapist will provide you with any instructions specific to your needs. While reading these guidelines, remember:  The exercises prescribed by your provider will help you have the flexibility and strength to maintain correct postures.  The correct posture provides the optimal environment for your joints to   work. All of your joints have less wear and tear when properly supported by a spine with good posture. This means you will experience a healthier, less painful body.  Correct posture must be practiced with all of your activities, especially prolonged sitting and standing. Correct posture is as important when doing repetitive low-stress activities (typing) as it is when doing a single heavy-load activity (lifting). PROLONGED STANDING WHILE SLIGHTLY LEANING FORWARD When completing a task that requires you to lean  forward while standing in one place for a long time, place either foot up on a stationary 2- to 4-inch high object to help maintain the best posture. When both feet are on the ground, the low back tends to lose its slight inward curve. If this curve flattens (or becomes too large), then the back and your other joints will experience too much stress, fatigue more quickly, and can cause pain.  RESTING POSITIONS Consider which positions are most painful for you when choosing a resting position. If you have pain with flexion-based activities (sitting, bending, stooping, squatting), choose a position that allows you to rest in a less flexed posture. You would want to avoid curling into a fetal position on your side. If your pain worsens with extension-based activities (prolonged standing, working overhead), avoid resting in an extended position such as sleeping on your stomach. Most people will find more comfort when they rest with their spine in a more neutral position, neither too rounded nor too arched. Lying on a non-sagging bed on your side with a pillow between your knees, or on your back with a pillow under your knees will often provide some relief. Keep in mind, being in any one position for a prolonged period of time, no matter how correct your posture, can still lead to stiffness. WALKING Walk with an upright posture. Your ears, shoulders, and hips should all line up. OFFICE WORK When working at a desk, create an environment that supports good, upright posture. Without extra support, muscles fatigue and lead to excessive strain on joints and other tissues. CHAIR:  A chair should be able to slide under your desk when your back makes contact with the back of the chair. This allows you to work closely.  The chair's height should allow your eyes to be level with the upper part of your monitor and your hands to be slightly lower than your elbows.  Body position:  Your feet should make contact with the  floor. If this is not possible, use a foot rest.  Keep your ears over your shoulders. This will reduce stress on your neck and low back. Document Released: 08/30/2005 Document Revised: 01/14/2014 Document Reviewed: 12/12/2008 [employer] Patient Information 2015 [employer]. This information is not intended to replace advice given to you by your health care provider. Make sure you discuss any questions you have with your health care provider.  

## 2014-07-09 ENCOUNTER — Ambulatory Visit (INDEPENDENT_AMBULATORY_CARE_PROVIDER_SITE_OTHER): Payer: PRIVATE HEALTH INSURANCE | Admitting: Internal Medicine

## 2014-07-09 ENCOUNTER — Encounter: Payer: Self-pay | Admitting: Internal Medicine

## 2014-07-09 VITALS — BP 122/76 | HR 90 | Temp 98.0°F | Wt 244.0 lb

## 2014-07-09 DIAGNOSIS — J309 Allergic rhinitis, unspecified: Secondary | ICD-10-CM

## 2014-07-09 MED ORDER — METHYLPREDNISOLONE ACETATE 80 MG/ML IJ SUSP
80.0000 mg | Freq: Once | INTRAMUSCULAR | Status: AC
  Administered 2014-07-09: 80 mg via INTRAMUSCULAR

## 2014-07-09 MED ORDER — MONTELUKAST SODIUM 10 MG PO TABS: 10.0000 mg | ORAL_TABLET | Freq: Every day | ORAL | Status: DC

## 2014-07-09 NOTE — Progress Notes (Signed)
Pre visit review using our clinic review tool, if applicable. No additional management support is needed unless otherwise documented below in the visit note. 

## 2014-07-09 NOTE — Progress Notes (Signed)
   Subjective:    Patient ID: Mikayla Briggs, female    DOB: 1981-03-01, 33 y.o.   MRN: [id number]  HPI Pt presents to the clinic today with c/o cold symptoms x 1 day.  Pt has PMH of allergic rhinitis and currently takes Allegra D and albuterol inhaler.  Pt reports that throat began to feel "scratchy" yesterday at work at [employer] [employer] after exposure to a patient.  Pt began to feel worse overnight.  Woke up with ear fullness, nasal congestion which is clear, and sensation of head fullness.  Pt denies cough, shortness of breath, fever, N/V or diarrhea.   Review of Systems Constitutional: Denies fever.  HEENT: Positive ear pain, runny nose, nasal congestion, sore throat. Negative eye pain, eye redness, ringing in the ears, wax buildup, bloody nose. Respiratory: Denies difficulty breathing, shortness of breath, cough or sputum production.   Cardiovascular: Denies chest tightness   No other specific complaints in a complete review of systems (except as listed in HPI above).     Objective:   Physical Exam  General: Appears their stated age, well developed, well nourished in NAD. Skin: Warm, dry and intact. No rashes, lesions or ulcerations noted. HEENT: Head: normal shape and size; Eyes: sclera white, no icterus, conjunctiva pink, PERRLA and EOMs intact; Ears: Tm's gray and intact, normal light reflex, effusion noted bilaterally; Nose: mucosa red and moist, septum midline; Throat/Mouth: Teeth present, mucosa pink and moist, no exudate, lesions or ulcerations noted.  Neck: Normal range of motion.trachea midline. No massses, lumps or thyromegaly present.  Cervical lymph nodes tender  Cardiovascular: Normal rate and rhythm. S1,S2 noted.  No murmur, rubs or gallops noted. No JVD or BLE edema. No carotid bruits noted. Pulmonary/Chest: Normal effort and positive vesicular breath sounds. No respiratory distress. No wheezes, rales or ronchi noted.      Assessment & Plan:   Allergic Rhinitis,  unspecified allergic rhinitis type -Injection of Depo-Medrol -Did not prescribe pt prednisone due to recent prescription, informed patient of the harm or long term steroids. -Prescribed Claritin to take in addition to the Allegra the pt is currently taking and use the inhaler if needed -Told pt to call if not better in few days time.

## 2014-07-09 NOTE — Patient Instructions (Signed)

## 2014-07-09 NOTE — Progress Notes (Signed)
HPI  Pt presents to the clinic today with c/o headache, fatigue, sore throat, post nasal drip and nasal congestion. She reports this started yesterday. She is blowing clear mucous out of her nose. She denies fever but has had chills and body aches. She is taking allegra daily for allergies. She has also had to use her inhaler over the last few days. She reports that she was on singulair in the past but her last doctor never renewed the prescription. She has no history of asthma. She has not had sick contacts that she is aware of. She does not smoke.  Review of Systems      Past Medical History  Diagnosis Date  . Chicken pox   . Allergy   . Frequent headaches     Family History  Problem Relation Age of Onset  . Arthritis Mother   . Cancer Mother     Ovarian/uterine  . Hyperlipidemia Mother   . Hypertension Mother   . Arthritis Father   . Hypertension Father   . Asthma Father   . Asthma Sister   . Arthritis Paternal Grandmother     History   Social History  . Marital Status: Divorced    Spouse Name: N/A    Number of Children: N/A  . Years of Education: N/A   Occupational History  . Not on file.   Social History Main Topics  . Smoking status: Never Smoker   . Smokeless tobacco: Never Used  . Alcohol Use: Yes     Comment: rare  . Drug Use: No  . Sexual Activity: Yes   Other Topics Concern  . Not on file   Social History Narrative  . No narrative on file    Allergies  Allergen Reactions  . Penicillins Anaphylaxis     Constitutional: Positive headache, fatigue. Denies fever or abrupt weight changes.  HEENT:  Positive ear fullness, nasal congestion, sore throat. Denies eye redness, eye pain, pressure behind the eyes, facial pain ringing in the ears, wax buildup, runny nose or bloody nose. Respiratory:  Denies cough, difficulty breathing or shortness of breath.  Cardiovascular: Denies chest pain, chest tightness, palpitations or swelling in the hands or feet.    No other specific complaints in a complete review of systems (except as listed in HPI above).  Objective:   BP 122/76  Pulse 90  Temp(Src) 98 F (36.7 C) (Oral)  Wt 244 lb (110.678 kg)  SpO2 98% Wt Readings from Last 3 Encounters:  07/09/14 244 lb (110.678 kg)  06/11/14 245 lb (111.131 kg)  05/21/14 240 lb 4 oz (108.977 kg)     General: Appears her stated age, obese but well developed, well nourished in NAD. HEENT: Head: normal shape and size;  Ears: Tm's red and intact, normal light reflex, + effusion bilaterally; Nose: mucosa boggy and moist, septum midline; Throat/Mouth: + PND. Teeth present, mucosa erythematous and moist, no exudate noted, no lesions or ulcerations noted.  Cardiovascular: Normal rate and rhythm. S1,S2 noted.  No murmur, rubs or gallops noted.  Pulmonary/Chest: Normal effort and positive vesicular breath sounds. No respiratory distress. No wheezes, rales or ronchi noted.      Assessment & Plan:   Allergic Rhinitis:  Get some rest and drink plenty of water Do salt water gargles for the sore throat 80 mg Depo IM Add in your flonase or nasocort- whichever you have at home, in addition to your allegra Will give RX for singulair to start taking daily to help reduce  flare up of allergies- if no improvement, may need to go see allergist.  RTC as needed or if symptoms persist.

## 2014-07-12 ENCOUNTER — Telehealth: Payer: Self-pay

## 2014-07-12 ENCOUNTER — Other Ambulatory Visit: Payer: Self-pay | Admitting: Internal Medicine

## 2014-07-12 MED ORDER — PREDNISONE 10 MG PO TABS: ORAL_TABLET | ORAL | Status: DC

## 2014-07-12 NOTE — Telephone Encounter (Signed)
Pt is aware.  

## 2014-07-12 NOTE — Telephone Encounter (Signed)
Pred taper sent to [facility]

## 2014-07-12 NOTE — Telephone Encounter (Signed)
Pt left v/m; pt seen 07/09/14 and pt has not seen a lot of improvement and request prednisone rx discussed [facility]. Pt still very congested. Pt request cb when med sent.Please advise.

## 2014-08-29 ENCOUNTER — Ambulatory Visit (INDEPENDENT_AMBULATORY_CARE_PROVIDER_SITE_OTHER): Payer: 59 | Admitting: Internal Medicine

## 2014-08-29 ENCOUNTER — Encounter: Payer: Self-pay | Admitting: Internal Medicine

## 2014-08-29 VITALS — BP 118/74 | HR 81 | Temp 98.4°F | Wt 247.5 lb

## 2014-08-29 DIAGNOSIS — M25561 Pain in right knee: Secondary | ICD-10-CM

## 2014-08-29 DIAGNOSIS — J0100 Acute maxillary sinusitis, unspecified: Secondary | ICD-10-CM

## 2014-08-29 MED ORDER — CEFUROXIME AXETIL 250 MG PO TABS: 250.0000 mg | ORAL_TABLET | Freq: Two times a day (BID) | ORAL | Status: DC

## 2014-08-29 MED ORDER — TRAMADOL HCL 50 MG PO TABS: 50.0000 mg | ORAL_TABLET | Freq: Three times a day (TID) | ORAL | Status: DC | PRN

## 2014-08-29 NOTE — Patient Instructions (Signed)

## 2014-08-29 NOTE — Progress Notes (Signed)
Pre visit review using our clinic review tool, if applicable. No additional management support is needed unless otherwise documented below in the visit note. 

## 2014-08-29 NOTE — Progress Notes (Signed)
HPI  Pt presents to the clinic today with c/o headache, nasal congestion, ear pain and sore throat. She reports this has been going on for the last 5 days. She is blowing yellow mucous out of her nose. She denies fever, chills or body aches. She has been taking Allegra, Singulair, OTC cold and sinus medication and nasal rinse without much relief. She has taken Tramadol for her headache which did seem to help. She does have a history of seasonal allergies. She has had sick contacts. She does not smoke.  Additionally, she reports that she needs a new referral for ortho. She has already gone to [facility]. However, because she switched insurance companies, she will need a new referral placed. She is seeing them for her right knee pain after a fall that occurred  1 year ago. The tramadol also helps with the right knee pain.  Review of Systems    Past Medical History  Diagnosis Date  . Chicken pox   . Allergy   . Frequent headaches     Family History  Problem Relation Age of Onset  . Arthritis Mother   . Cancer Mother     Ovarian/uterine  . Hyperlipidemia Mother   . Hypertension Mother   . Arthritis Father   . Hypertension Father   . Asthma Father   . Asthma Sister   . Arthritis Paternal Grandmother     History   Social History  . Marital Status: Divorced    Spouse Name: N/A    Number of Children: N/A  . Years of Education: N/A   Occupational History  . Not on file.   Social History Main Topics  . Smoking status: Never Smoker   . Smokeless tobacco: Never Used  . Alcohol Use: Yes     Comment: rare  . Drug Use: No  . Sexual Activity: Yes   Other Topics Concern  . Not on file   Social History Narrative    Allergies  Allergen Reactions  . Penicillins Anaphylaxis     Constitutional: Positive headache, fatigue. Denies fever or abrupt weight changes.  HEENT:  Positive facial pain, ear pain nasal congestion and sore throat. Denies eye redness, ringing in the ears,  wax buildup, runny nose or bloody nose. Respiratory:  Denies cough, difficulty breathing or shortness of breath.  Cardiovascular: Denies chest pain, chest tightness, palpitations or swelling in the hands or feet.  MSK: Pt reports right knee pain. Denies difficulty with gait or joint swelling.   No other specific complaints in a complete review of systems (except as listed in HPI above).  Objective:  BP 118/74 mmHg  Pulse 81  Temp(Src) 98.4 F (36.9 C) (Oral)  Wt 247 lb 8 oz (112.265 kg)  SpO2 97%   General: Appears her stated age, obese but well developed, well nourished in NAD. HEENT: Head: normal shape and size, maxillary sinus tenderness noted; Eyes: sclera white, no icterus, conjunctiva pink; Ears: Tm's gray and intact, normal light reflex, + serous effusion bilaterally; Nose: mucosa boggy and moist, septum midline; Throat/Mouth: + PND. Teeth present, mucosa pink and moist, no exudate noted, no lesions or ulcerations noted.  Neck: Cervical adenopathy noted. Cardiovascular: Normal rate and rhythm. S1,S2 noted.  No murmur, rubs or gallops noted.  Pulmonary/Chest: Normal effort and positive vesicular breath sounds. No respiratory distress. No wheezes, rales or ronchi noted.  MSK: Normal flexion and extension of the right knee. Pain with palpation of the medial joint line. No swelling noted. Strength 5/5 BLE.  Assessment & Plan:   Acute maxillary sinusitis  Can use a Neti Pot which can be purchased from your local drug store. Continue allegra, flonase and singulair Ceftin BID for 10 days  Right knee pain:  RX for tramadol given today Will place referral for ortho for insurance purposes  RTC as needed or if symptoms persist.

## 2014-09-13 DIAGNOSIS — M6751 Plica syndrome, right knee: Secondary | ICD-10-CM

## 2014-09-13 DIAGNOSIS — M2241 Chondromalacia patellae, right knee: Secondary | ICD-10-CM

## 2014-09-13 DIAGNOSIS — M23221 Derangement of posterior horn of medial meniscus due to old tear or injury, right knee: Secondary | ICD-10-CM

## 2014-09-13 HISTORY — DX: Derangement of posterior horn of medial meniscus due to old tear or injury, right knee: M23.221

## 2014-09-13 HISTORY — DX: Chondromalacia patellae, right knee: M22.41

## 2014-09-13 HISTORY — DX: Plica syndrome, right knee: M67.51

## 2014-09-25 ENCOUNTER — Other Ambulatory Visit: Payer: Self-pay

## 2014-09-25 DIAGNOSIS — J3089 Other allergic rhinitis: Secondary | ICD-10-CM

## 2014-09-25 MED ORDER — FEXOFENADINE HCL 180 MG PO TABS: 180.0000 mg | ORAL_TABLET | Freq: Two times a day (BID) | ORAL | Status: DC

## 2014-09-30 ENCOUNTER — Other Ambulatory Visit: Payer: Self-pay | Admitting: Physician Assistant

## 2014-10-03 ENCOUNTER — Encounter (HOSPITAL_BASED_OUTPATIENT_CLINIC_OR_DEPARTMENT_OTHER): Payer: Self-pay | Admitting: *Deleted

## 2014-10-09 NOTE — H&P (Signed)
[facility] [address]   [address] [place], [place] [id number] [phone] A Division of [facility]  Loreta Aveaniel F. [facility], M.D.   Robert A. Thurston [facility], M.D.   Burnell BlanksW. Dan Caffrey, M.D.   Eulas PostJoshua P. Landau, M.D.   Lunette StandsAnna Voytek, M.D Jewel Baizeimothy D. Eulah [facility], M.D.  Buford DresserWesley R. Ibazebo, M.D.  Estell HarpinJames S. Kramer, M.D.    Melina Fiddlerebecca S. Bassett, M.D. Mary L. Isidoro DonningAnton, PA-C  Kirstin A. Shepperson, PA-C  Josh Jonesporthadwell, PA-C [place] HarborBrandon Parry, [place]-C   RE: Mikayla Briggs, Mikayla                                [phone]      DOB: [birth year] PROGRESS NOTE: 09-17-14 Mikayla Briggs comes in for evaluation and recommendation for her right knee.  Previous treatment, evaluation and care by Dr. Farris HasKramer.  I have reviewed all of those notes.  This was a relatively abrupt onset following an injury which was an anteromedial blow to her knee back in December of 2014.  Injection in January and then July of 2015.  Both of these helped improve things, but never resolved things.  She has just worked a great deal of overtime and now has marked significant increased symptoms.  Her knee has really never been right since the injury, but now completely intolerable.  Catching and locking, all of this anteromedial.  A little swelling after activity.   Previous x-rays not showing any significant degenerative change.  MRI which was read as some mild chondromalacia, as well as low grade patella edema anteromedial I have reviewed.  This also shows a medial plica, as well as some degenerative changes scattered throughout the medial meniscus, but not an overt tear.   In long questioning with her, her majority of symptoms really are consistent with a plica.  Of note, she had surgery on her left knee when she was 17, which sounds like she had a plica excised at that time.  She did quite well with that.   Remaining history and general exam is outlined and included in the chart.  This is significant for obesity.       EXAMINATION: Height: 5?6.  Weight: 235 pounds.  Specific exam shows a negative log roll of both hips.  Negative straight leg raise.  She has increased valgus, increased Q angles, both sides, with a little bit of ligamentous laxity.  Lateral tracking, but I am not getting a lot of tethering or patellofemoral crepitus.  Not a lot of pain with patellar compression in either knee.  Exquisitely tender plica on the right with soreness along the medial joint line.  Neurovascularly intact distally.    DISPOSITION:  I have had a long extensive talk with her.  We have talked about exogenous obesity, her Q angles and her maltracking patellofemoral joint. Despite all of that I really do feel like the main issue here is a plica rather than other structural damage.  After covering all options in great detail I have agreed to proceed with exam under anesthesia, arthroscopy.  Excise her plica.  Look at patella tracking.  Chondroplasty.  Add lateral release if indicated.  Procedure, risks, benefits and complications reviewed in great detail with her.  Very cautious outlook.  I have also told her that she needs to continue to work at weight loss.  She understands and agrees.  I will see her at the time of operative intervention.  Loreta Ave, M.D.   Electronically verified by Loreta Ave, M.D.

## 2014-10-10 ENCOUNTER — Ambulatory Visit (HOSPITAL_BASED_OUTPATIENT_CLINIC_OR_DEPARTMENT_OTHER): Payer: 59 | Admitting: Anesthesiology

## 2014-10-10 ENCOUNTER — Encounter (HOSPITAL_BASED_OUTPATIENT_CLINIC_OR_DEPARTMENT_OTHER): Payer: Self-pay

## 2014-10-10 ENCOUNTER — Encounter (HOSPITAL_BASED_OUTPATIENT_CLINIC_OR_DEPARTMENT_OTHER)
Admission: RE | Disposition: A | Discharge status: Home / Self Care | Payer: Self-pay | Source: Ambulatory Visit | Attending: Orthopedic Surgery

## 2014-10-10 ENCOUNTER — Ambulatory Visit (HOSPITAL_BASED_OUTPATIENT_CLINIC_OR_DEPARTMENT_OTHER)
Admission: RE | Admit: 2014-10-10 | Discharge: 2014-10-10 | Disposition: A | Discharge status: Home / Self Care | Payer: 59 | Source: Ambulatory Visit | Attending: Orthopedic Surgery | Admitting: Orthopedic Surgery

## 2014-10-10 DIAGNOSIS — M2241 Chondromalacia patellae, right knee: Secondary | ICD-10-CM | POA: Insufficient documentation

## 2014-10-10 DIAGNOSIS — M6751 Plica syndrome, right knee: Secondary | ICD-10-CM | POA: Insufficient documentation

## 2014-10-10 DIAGNOSIS — M23221 Derangement of posterior horn of medial meniscus due to old tear or injury, right knee: Secondary | ICD-10-CM | POA: Insufficient documentation

## 2014-10-10 HISTORY — DX: Plica syndrome, right knee: M67.51

## 2014-10-10 HISTORY — DX: Derangement of posterior horn of medial meniscus due to old tear or injury, right knee: M23.221

## 2014-10-10 HISTORY — DX: Other seasonal allergic rhinitis: J30.2

## 2014-10-10 HISTORY — DX: Chondromalacia patellae, right knee: M22.41

## 2014-10-10 HISTORY — PX: KNEE ARTHROSCOPY WITH MEDIAL MENISECTOMY: SHX5651

## 2014-10-10 HISTORY — DX: Migraine, unspecified, not intractable, without status migrainosus: G43.909

## 2014-10-10 LAB — POCT HEMOGLOBIN-HEMACUE: HEMOGLOBIN: 14.4 g/dL (ref 12.0–15.0)

## 2014-10-10 SURGERY — ARTHROSCOPY, KNEE, WITH MEDIAL MENISCECTOMY
Anesthesia: General | Site: Knee | Laterality: Right

## 2014-10-10 MED ORDER — MIDAZOLAM HCL 2 MG/ML PO SYRP: 12.0000 mg | ORAL_SOLUTION | Freq: Once | ORAL | Status: DC | PRN

## 2014-10-10 MED ORDER — CLINDAMYCIN PHOSPHATE 900 MG/50ML IV SOLN
INTRAVENOUS | Status: AC
  Filled 2014-10-10: qty 50

## 2014-10-10 MED ORDER — METHYLPREDNISOLONE ACETATE 80 MG/ML IJ SUSP
INTRAMUSCULAR | Status: AC
  Filled 2014-10-10: qty 1

## 2014-10-10 MED ORDER — PROPOFOL 10 MG/ML IV BOLUS
INTRAVENOUS | Status: DC | PRN
  Administered 2014-10-10: 20 mg via INTRAVENOUS
  Administered 2014-10-10: 200 mg via INTRAVENOUS

## 2014-10-10 MED ORDER — FENTANYL CITRATE 0.05 MG/ML IJ SOLN: 50.0000 ug | INTRAMUSCULAR | Status: DC | PRN

## 2014-10-10 MED ORDER — DEXAMETHASONE SODIUM PHOSPHATE 4 MG/ML IJ SOLN
INTRAMUSCULAR | Status: DC | PRN
  Administered 2014-10-10: 10 mg via INTRAVENOUS

## 2014-10-10 MED ORDER — METHYLPREDNISOLONE ACETATE 80 MG/ML IJ SUSP
INTRAMUSCULAR | Status: DC | PRN
  Administered 2014-10-10: 80 mg via INTRA_ARTICULAR

## 2014-10-10 MED ORDER — BUPIVACAINE-EPINEPHRINE (PF) 0.25% -1:200000 IJ SOLN
INTRAMUSCULAR | Status: AC
  Filled 2014-10-10: qty 30

## 2014-10-10 MED ORDER — BUPIVACAINE HCL (PF) 0.5 % IJ SOLN
INTRAMUSCULAR | Status: AC
  Filled 2014-10-10: qty 30

## 2014-10-10 MED ORDER — ONDANSETRON HCL 4 MG/2ML IJ SOLN
INTRAMUSCULAR | Status: DC | PRN
  Administered 2014-10-10: 4 mg via INTRAVENOUS

## 2014-10-10 MED ORDER — MIDAZOLAM HCL 5 MG/5ML IJ SOLN
INTRAMUSCULAR | Status: DC | PRN
  Administered 2014-10-10: 2 mg via INTRAVENOUS

## 2014-10-10 MED ORDER — LACTATED RINGERS IV SOLN
INTRAVENOUS | Status: DC
  Administered 2014-10-10: 08:00:00 via INTRAVENOUS

## 2014-10-10 MED ORDER — LIDOCAINE HCL (CARDIAC) 20 MG/ML IV SOLN
INTRAVENOUS | Status: DC | PRN
  Administered 2014-10-10: 100 mg via INTRAVENOUS

## 2014-10-10 MED ORDER — PROPOFOL 10 MG/ML IV BOLUS
INTRAVENOUS | Status: AC
  Filled 2014-10-10: qty 40

## 2014-10-10 MED ORDER — CHLORHEXIDINE GLUCONATE 4 % EX LIQD: 60.0000 mL | Freq: Once | CUTANEOUS | Status: DC

## 2014-10-10 MED ORDER — MIDAZOLAM HCL 2 MG/2ML IJ SOLN
INTRAMUSCULAR | Status: AC
  Filled 2014-10-10: qty 2

## 2014-10-10 MED ORDER — CLINDAMYCIN PHOSPHATE 900 MG/50ML IV SOLN
900.0000 mg | INTRAVENOUS | Status: AC
  Administered 2014-10-10: 900 mg via INTRAVENOUS

## 2014-10-10 MED ORDER — ONDANSETRON HCL 4 MG PO TABS: 4.0000 mg | ORAL_TABLET | Freq: Three times a day (TID) | ORAL | Status: DC | PRN

## 2014-10-10 MED ORDER — BUPIVACAINE HCL (PF) 0.5 % IJ SOLN
INTRAMUSCULAR | Status: DC | PRN
  Administered 2014-10-10: 20 mL via INTRA_ARTICULAR

## 2014-10-10 MED ORDER — BUPIVACAINE HCL (PF) 0.25 % IJ SOLN
INTRAMUSCULAR | Status: AC
  Filled 2014-10-10: qty 30

## 2014-10-10 MED ORDER — FENTANYL CITRATE 0.05 MG/ML IJ SOLN
INTRAMUSCULAR | Status: DC | PRN
  Administered 2014-10-10 (×2): 50 ug via INTRAVENOUS
  Administered 2014-10-10: 25 ug via INTRAVENOUS
  Administered 2014-10-10: 50 ug via INTRAVENOUS
  Administered 2014-10-10: 25 ug via INTRAVENOUS

## 2014-10-10 MED ORDER — SODIUM CHLORIDE 0.9 % IR SOLN
Status: DC | PRN
Start: 2014-10-10 — End: 2014-10-10
  Administered 2014-10-10: 4000 mL

## 2014-10-10 MED ORDER — LACTATED RINGERS IV SOLN
INTRAVENOUS | Status: DC
  Administered 2014-10-10 (×2): via INTRAVENOUS

## 2014-10-10 MED ORDER — FENTANYL CITRATE 0.05 MG/ML IJ SOLN
INTRAMUSCULAR | Status: AC
  Filled 2014-10-10: qty 6

## 2014-10-10 MED ORDER — MIDAZOLAM HCL 2 MG/2ML IJ SOLN: 1.0000 mg | INTRAMUSCULAR | Status: DC | PRN

## 2014-10-10 MED ORDER — OXYCODONE-ACETAMINOPHEN 5-325 MG PO TABS
1.0000 | ORAL_TABLET | ORAL | Status: DC | PRN
Start: 2014-10-10 — End: 2015-07-15

## 2014-10-10 SURGICAL SUPPLY — 39 items
BANDAGE ELASTIC 6 VELCRO ST LF (GAUZE/BANDAGES/DRESSINGS) ×2 IMPLANT
BLADE CUDA 5.5 (BLADE) IMPLANT
BLADE CUDA GRT WHITE 3.5 (BLADE) IMPLANT
BLADE CUTTER GATOR 3.5 (BLADE) ×2 IMPLANT
BLADE CUTTER MENIS 5.5 (BLADE) IMPLANT
BLADE GREAT WHITE 4.2 (BLADE) ×2 IMPLANT
BUR OVAL 4.0 (BURR) IMPLANT
CANISTER SUCT 3000ML (MISCELLANEOUS) IMPLANT
CHLORAPREP W/TINT 26ML (MISCELLANEOUS) ×2 IMPLANT
CUTTER MENISCUS  4.2MM (BLADE)
CUTTER MENISCUS 4.2MM (BLADE) IMPLANT
DRAPE ARTHROSCOPY W/POUCH 90 (DRAPES) ×2 IMPLANT
DURAPREP 26ML APPLICATOR (WOUND CARE) IMPLANT
ELECT MENISCUS 165MM 90D (ELECTRODE) ×2 IMPLANT
ELECT REM PT RETURN 9FT ADLT (ELECTROSURGICAL)
ELECTRODE REM PT RTRN 9FT ADLT (ELECTROSURGICAL) IMPLANT
GAUZE SPONGE 4X4 12PLY STRL (GAUZE/BANDAGES/DRESSINGS) ×4 IMPLANT
GAUZE XEROFORM 1X8 LF (GAUZE/BANDAGES/DRESSINGS) ×2 IMPLANT
GLOVE BIOGEL PI IND STRL 7.0 (GLOVE) ×2 IMPLANT
GLOVE BIOGEL PI INDICATOR 7.0 (GLOVE) ×2
GLOVE ECLIPSE 6.5 STRL STRAW (GLOVE) ×2 IMPLANT
GLOVE ECLIPSE 7.0 STRL STRAW (GLOVE) ×2 IMPLANT
GLOVE EXAM NITRILE LRG STRL (GLOVE) ×2 IMPLANT
GLOVE ORTHO TXT STRL SZ7.5 (GLOVE) ×2 IMPLANT
GOWN STRL REUS W/ TWL LRG LVL3 (GOWN DISPOSABLE) ×3 IMPLANT
GOWN STRL REUS W/TWL LRG LVL3 (GOWN DISPOSABLE) ×3
HOLDER KNEE FOAM BLUE (MISCELLANEOUS) ×2 IMPLANT
IV NS IRRIG 3000ML ARTHROMATIC (IV SOLUTION) ×4 IMPLANT
KNEE WRAP E Z 3 GEL PACK (MISCELLANEOUS) ×2 IMPLANT
MANIFOLD NEPTUNE II (INSTRUMENTS) ×2 IMPLANT
PACK ARTHROSCOPY DSU (CUSTOM PROCEDURE TRAY) ×2 IMPLANT
PACK BASIN DAY SURGERY FS (CUSTOM PROCEDURE TRAY) ×2 IMPLANT
PENCIL BUTTON HOLSTER BLD 10FT (ELECTRODE) ×2 IMPLANT
SET ARTHROSCOPY TUBING (MISCELLANEOUS) ×1
SET ARTHROSCOPY TUBING LN (MISCELLANEOUS) ×1 IMPLANT
SUT ETHILON 3 0 PS 1 (SUTURE) ×2 IMPLANT
SUT VIC AB 3-0 FS2 27 (SUTURE) IMPLANT
TOWEL OR 17X24 6PK STRL BLUE (TOWEL DISPOSABLE) ×2 IMPLANT
WATER STERILE IRR 1000ML POUR (IV SOLUTION) ×2 IMPLANT

## 2014-10-10 NOTE — Interval H&P Note (Signed)
History and Physical Interval Note:  10/10/2014 7:26 AM  Leotis ShamesLauren Viann FishM Thompson  has presented today for surgery, with the diagnosis of PLICA SYNDROME,RIGHT KNEE CHONDROMALACIA PATELLAE,RIGHT KNEE,DERANGEMENT OF POSTERIOR HORN MEDIAL MENISCUS DUE TO OLD TEAR OR INJURY RIGHT KNEE  The various methods of treatment have been discussed with the patient and family. After consideration of risks, benefits and other options for treatment, the patient has consented to  Procedure(s): RIGHT KNEE ARTHROSCOPY PLICA,CHONDROPLASTY,MEDIAL MENISCECTOMY, POSSIBLE LATERAL RELEASE (Right) as a surgical intervention .  The patient's history has been reviewed, patient examined, no change in status, stable for surgery.  I have reviewed the patient's chart and labs.  Questions were answered to the patient's satisfaction.     Circe Chilton F

## 2014-10-10 NOTE — Discharge Instructions (Signed)
Arthroscopic Procedure, Knee, Care After °Refer to this sheet in the next few weeks. These discharge instructions provide you with general information on caring for yourself after you leave the hospital. Your health care provider may also give you specific instructions. Your treatment has been planned according to the most current medical practices available, but unavoidable complications sometimes occur. If you have any problems or questions after discharge, please call your health care provider. °HOME CARE INSTRUCTIONS  ° °Weight bearing as tolerated.  Remove bandages and apply band-aids in 3 days.  May shower in 3 days, but do not soak incisions.  May apply ice for up to 20 minutes at a time for pain and swelling.  Follow up appointment in one week. ° °· It is normal to be sore for a couple days after surgery. See your health care provider if this seems to be getting worse rather than better. °· Only take over-the-counter or prescription medicines for pain, discomfort, or fever as directed by your health care provider. °· Take showers rather than baths, or as directed by your health care provider. °· Change bandages (dressings) if necessary or as directed. °· You may resume normal diet and activities as directed or allowed. °· Avoid lifting and driving until you are directed otherwise. °· Make an appointment to see your health care provider for stitches (suture) or staple removal as directed. °· You may put ice on the area. °· Put the ice in a plastic bag. Place a towel between your skin and the bag. °· Leave the ice on for 15-20 minutes, three to four times per day for the first 2 days. °· Elevate the knee above the level of your heart to reduce swelling, and avoid dangling the leg. °· Do 10-15 ankle pumps (pointing your toes toward you and then away from you) two to three times daily. °· If you are given compression stockings to wear after surgery, use them for as long as your surgeon tells you (around 10-14  days). °· Avoid smoking and exposure to secondhand smoke. °SEEK MEDICAL CARE IF:  °· You have increased bleeding from your wounds. °· You see redness or swelling or you have increasing pain in your wounds. °· You have pus coming from your wound. °· You have a fever or persistent symptoms for more than 2-3 days. °· You notice a bad smell coming from the wound or dressing. °· You have severe pain with any motion of your knee. °SEEK IMMEDIATE MEDICAL CARE IF:  °· You develop a rash. °· You have difficulty breathing. °· You develop any reaction or side effects to medicines taken. °· You develop pain in the calves or back of the knee. °· You develop chest pain, shortness of breath, or difficulty breathing. °· You develop numbness or tingling in the leg or foot. °MAKE SURE YOU:  °· Understand these instructions. °· Will watch your condition. °· Will get help right away if you are not doing well or you get worse. °Document Released: 03/19/2005 Document Revised: 09/04/2013 Document Reviewed: 01/25/2013 °ExitCare® Patient Information ©2015 [employer]. This information is not intended to replace advice given to you by your health care provider. Make sure you discuss any questions you have with your health care provider. ° ° °Post Anesthesia Home Care Instructions ° °Activity: °Get plenty of rest for the remainder of the day. A responsible adult should stay with you for 24 hours following the procedure.  °For the next 24 hours, DO NOT: °-Drive a car °-  Operate machinery °-Drink alcoholic beverages °-Take any medication unless instructed by your physician °-Make any legal decisions or sign important papers. ° °Meals: °Start with liquid foods such as gelatin or soup. Progress to regular foods as tolerated. Avoid greasy, spicy, heavy foods. If nausea and/or vomiting occur, drink only clear liquids until the nausea and/or vomiting subsides. Call your physician if vomiting continues. ° °Special Instructions/Symptoms: °Your throat  may feel dry or sore from the anesthesia or the breathing tube placed in your throat during surgery. If this causes discomfort, gargle with warm salt water. The discomfort should disappear within 24 hours. ° ° °

## 2014-10-10 NOTE — Anesthesia Procedure Notes (Signed)
Procedure Name: LMA Insertion Date/Time: 10/10/2014 9:06 AM Performed by: Gar GibbonKEETON, Quinzell Malcomb S Pre-anesthesia Checklist: Patient identified, Emergency Drugs available, Suction available and Patient being monitored Patient Re-evaluated:Patient Re-evaluated prior to inductionOxygen Delivery Method: Circle System Utilized Preoxygenation: Pre-oxygenation with 100% oxygen Intubation Type: IV induction Ventilation: Mask ventilation without difficulty LMA: LMA inserted LMA Size: 4.0 Number of attempts: 1 Airway Equipment and Method: Bite block Placement Confirmation: positive ETCO2 Tube secured with: Tape Dental Injury: Teeth and Oropharynx as per pre-operative assessment

## 2014-10-10 NOTE — Anesthesia Postprocedure Evaluation (Signed)
Anesthesia Post Note  Patient: Mikayla Briggs  Procedure(s) Performed: Procedure(s) (LRB): RIGHT KNEE ARTHROSCOPY; RESECTION PLICA; CHONDROPLASTY, AND PARTIAL MEDIAL MENISCECTOMY (Right)  Anesthesia type: general  Patient location: PACU  Post pain: Pain level controlled  Post assessment: Patient's Cardiovascular Status Stable  Last Vitals:  Filed Vitals:   10/10/14 1030  BP: 132/84  Pulse: 77  Temp:   Resp: 15    Post vital signs: Reviewed and stable  Level of consciousness: sedated  Complications: No apparent anesthesia complications

## 2014-10-10 NOTE — Transfer of Care (Signed)
Immediate Anesthesia Transfer of Care Note  Patient: Mikayla AbbotLauren M Thompson  Procedure(s) Performed: Procedure(s): RIGHT KNEE ARTHROSCOPY; RESECTION PLICA; CHONDROPLASTY, AND PARTIAL MEDIAL MENISCECTOMY (Right)  Patient Location: PACU  Anesthesia Type:General  Level of Consciousness: awake, sedated, patient cooperative and confused  Airway & Oxygen Therapy: Patient Spontanous Breathing and Patient connected to face mask oxygen  Post-op Assessment: Report given to PACU RN and Post -op Vital signs reviewed and stable  Post vital signs: Reviewed and stable  Last Vitals:  Filed Vitals:   10/10/14 0732  BP: 151/99  Pulse: 85  Temp: 36.8 C  Resp: 24    Complications: No apparent anesthesia complications

## 2014-10-10 NOTE — Anesthesia Preprocedure Evaluation (Addendum)
Anesthesia Evaluation  Patient identified by MRN, date of birth, ID band Patient awake    Reviewed: Allergy & Precautions, H&P , NPO status , Patient's Chart, lab work & pertinent test results  Airway Mallampati: II  TM Distance: >3 FB     Dental  (+) Teeth Intact, Dental Advidsory Given   Pulmonary neg pulmonary ROS,  breath sounds clear to auscultation        Cardiovascular negative cardio ROS  Rhythm:regular Rate:Normal     Neuro/Psych  Headaches, negative psych ROS   GI/Hepatic negative GI ROS, Neg liver ROS,   Endo/Other  Morbid obesity  Renal/GU negative Renal ROS     Musculoskeletal   Abdominal   Peds  Hematology   Anesthesia Other Findings   Reproductive/Obstetrics negative OB ROS                            Anesthesia Physical Anesthesia Plan  ASA: II  Anesthesia Plan:    Post-op Pain Management:    Induction:   Airway Management Planned:   Additional Equipment:   Intra-op Plan:   Post-operative Plan:   Informed Consent: I have reviewed the patients History and Physical, chart, labs and discussed the procedure including the risks, benefits and alternatives for the proposed anesthesia with the patient or authorized representative who has indicated his/her understanding and acceptance.   Dental Advisory Given  Plan Discussed with: Anesthesiologist, CRNA and Surgeon  Anesthesia Plan Comments:        Anesthesia Quick Evaluation

## 2014-10-11 ENCOUNTER — Encounter (HOSPITAL_BASED_OUTPATIENT_CLINIC_OR_DEPARTMENT_OTHER): Payer: Self-pay | Admitting: Orthopedic Surgery

## 2014-10-11 NOTE — Op Note (Signed)
[facility] Raven:  Briggs, Mikayla Briggs              ACCOUNT NO.:  [id number]  MEDICAL RECORD NO.:  [id number]  LOCATION:                                 FACILITY:  PHYSICIAN:  Loreta Aveaniel F. Jaimey Franchini, M.D. DATE OF BIRTH:  Feb 15, 1981  DATE OF PROCEDURE:  10/10/2014 DATE OF DISCHARGE:  10/10/2014                              OPERATIVE REPORT   PREOPERATIVE DIAGNOSIS:  Right knee chondromalacia patella, medial plica.  POSTOPERATIVE DIAGNOSIS:  Right knee chondromalacia patella, medial plica; also complex tearing, posterior third medial meniscus; and a focal grade 3 lesion weightbearing dome medial femoral condyle, grade 2 on the patella.  PROCEDURE:  Right knee exam under anesthesia, arthroscopy. Chondroplasty patella medial femoral condyle.  Partial medial meniscectomy.  Excision of medial plica.  SURGEON:  Loreta Aveaniel F. Eagan Shifflett, M.D.  ASSISTANT:  Odelia GageLindsey Anton, PA.  ANESTHESIA:  General.  BLOOD LOSS:  Minimal.  SPECIMENS:  None.  CULTURES:  None.  COMPLICATIONS:  None.  DRESSINGS:  Soft compressive.  TOURNIQUET:  Not employed.  PROCEDURE:  Patient was brought to operating room, placed on the operating table in a supine position.  After adequate anesthesia had been obtained, leg holder applied.  Leg prepped and draped in usual sterile fashion.  Two portals, one each medial and lateral parapatellar. Arthroscope was introduced.  Knee distended and inspected.  Some grade 2 changes on peaking the patella, debrided.  Although some lateral tracking hypermobility, sheath does not tether, lateral release not indicated.  Large inflamed medial plica excised.  Hemostasis with cautery.  Lateral compartment, lateral meniscus intact.  ACL had some attenuation, some laxity, but was still functional.  Medial meniscus complex tearing of posterior third taken down to a stable rim, tapered in smoothly.  1.5 cm deep grade 3 lesion, weightbearing dome on the condyle, slightly lateral.  Chondroplasty to a stable  surface.  Entire knee examined and all the findings were appreciated.  Instruments were fully removed.  Portals were closed with nylon.  Knee injected with Depo- Medrol and Marcaine.  Sterile compressive dressing applied.  Anesthesia reversed.  Brought to the recovery room.  Tolerated the surgery well. No complications.     Loreta Aveaniel F. [name], M.D.     DFM/MEDQ  D:  10/10/2014  T:  10/11/2014  Job:  [id number]

## 2014-10-14 ENCOUNTER — Ambulatory Visit (INDEPENDENT_AMBULATORY_CARE_PROVIDER_SITE_OTHER)
Admission: RE | Admit: 2014-10-14 | Discharge: 2014-10-14 | Disposition: A | Discharge status: Home / Self Care | Payer: 59 | Source: Ambulatory Visit | Attending: Internal Medicine | Admitting: Internal Medicine

## 2014-10-14 ENCOUNTER — Telehealth: Payer: Self-pay | Admitting: Family Medicine

## 2014-10-14 ENCOUNTER — Ambulatory Visit (INDEPENDENT_AMBULATORY_CARE_PROVIDER_SITE_OTHER): Payer: 59 | Admitting: Internal Medicine

## 2014-10-14 ENCOUNTER — Encounter: Payer: Self-pay | Admitting: Internal Medicine

## 2014-10-14 VITALS — BP 124/86 | HR 104 | Temp 98.6°F

## 2014-10-14 DIAGNOSIS — J181 Lobar pneumonia, unspecified organism: Secondary | ICD-10-CM

## 2014-10-14 DIAGNOSIS — R0789 Other chest pain: Secondary | ICD-10-CM

## 2014-10-14 DIAGNOSIS — R0602 Shortness of breath: Secondary | ICD-10-CM

## 2014-10-14 DIAGNOSIS — J189 Pneumonia, unspecified organism: Secondary | ICD-10-CM

## 2014-10-14 DIAGNOSIS — R110 Nausea: Secondary | ICD-10-CM

## 2014-10-14 DIAGNOSIS — R05 Cough: Secondary | ICD-10-CM

## 2014-10-14 DIAGNOSIS — R059 Cough, unspecified: Secondary | ICD-10-CM

## 2014-10-14 MED ORDER — LEVOFLOXACIN 750 MG PO TABS: 750.0000 mg | ORAL_TABLET | Freq: Every day | ORAL | Status: DC

## 2014-10-14 NOTE — Progress Notes (Addendum)
   Subjective:    Patient ID: Mikayla AbbotLauren M Briggs, female    DOB: [birth year], 34 y.o.   MRN: [id number]  HPI   [name] is a 34 year old female who presents today with chief complaint of cough and feeling nauseous.  Symptoms began on Saturday 1/30.  She reports that the cough is non productive and is only intermittent. She feels nauseous but has been able to eat and drink. She feels a burning in her chest when she coughs, associated with shortness of breath. She had a scope of the right knee 4 days ago and has been taking pain medication since.  She is tearful and anxious and reports that she just feels sick and that her body hurts all over. She had not tried anything OTC.   Review of Systems  Constitutional: Negative for fever, chills and unexpected weight change.  HENT: Negative for congestion, ear pain, postnasal drip, rhinorrhea, sinus pressure and sore throat.   Respiratory: Positive for cough and shortness of breath. Negative for wheezing.   Cardiovascular: Negative for chest pain, palpitations and leg swelling.  Gastrointestinal: Positive for nausea and constipation. Negative for vomiting, abdominal pain and diarrhea.  Genitourinary: Negative for dysuria, urgency and flank pain.  Musculoskeletal: Negative for back pain, joint swelling and neck pain.  Skin: Negative for color change, pallor, rash and wound.  Neurological: Negative for dizziness, light-headedness and headaches.  Psychiatric/Behavioral: The patient is nervous/anxious.        Objective:   Physical Exam  Constitutional: She appears well-developed and well-nourished. She appears distressed.  HENT:  Head: Normocephalic and atraumatic.  Right Ear: External ear normal.  Left Ear: External ear normal.  Nose: Nose normal.  Mouth/Throat: Oropharynx is clear and moist. No oropharyngeal exudate.  Eyes: Conjunctivae are normal. Pupils are equal, round, and reactive to light.  Neck: Normal range of motion. Neck supple.    Cardiovascular: Regular rhythm, normal heart sounds and intact distal pulses.  Exam reveals no gallop and no friction rub.   No murmur heard. Tachycardic  Pulmonary/Chest: Effort normal and breath sounds normal.  Abdominal: Soft. Bowel sounds are normal.  Musculoskeletal: Normal range of motion.       Right knee: Tenderness found.  Status post knee scope on Thursday.  Removed wrap and inspected knee.  No erythema, swelling or drainage.  Band aids present and were clean dry and intact.      BP 124/86 mmHg  Pulse 104  Temp(Src) 98.6 F (37 C) (Oral)  SpO2 99%  LMP 08/13/2014      Assessment & Plan:  1. Cough/Burning in Chest/SOB - Will obtain chest Xray to r/p pneumonia as patient reports non specific symptoms.  If pneumonia noted, will start Levaquin 2. Nausea - Patient has zofran at home to take for nausea.  She is able to eat and drink fluids.  Addendum: xray did show LLL pneumonia, possibly aspiration. Levaquin ordered, pt notified. Advised patient if she develops fever or worsening symptoms to call the office.

## 2014-10-14 NOTE — Progress Notes (Signed)
Pre visit review using our clinic review tool, if applicable. No additional management support is needed unless otherwise documented below in the visit note. 

## 2014-10-14 NOTE — Addendum Note (Signed)
Addended by: Lorre MunroeBAITY, Gerrit Rafalski W on: 10/14/2014 11:22 AM   Modules accepted: Orders

## 2014-10-14 NOTE — Patient Instructions (Signed)

## 2014-10-14 NOTE — Telephone Encounter (Signed)
Patient Name: Josepha PiggLAUREN THOMPSON  DOB: May 03, 1981    Initial Comment Caller states she had knee surgery on Thursday morning, she is having chest pain, and fever.   Nurse Assessment  Nurse: Laural BenesJohnson, RN, Dondra SpryGail Date/Time Lamount Cohen(Eastern Time): 10/14/2014 8:29:49 AM  Confirm and document reason for call. If symptomatic, describe symptoms. ---Alanda had knee surgery --scope -- on Thursday morning and Saturday woke up with chest tightness coughing and body aches and fever present. feels she has pneumonia  Has the patient traveled out of the country within the last 30 days? ---No  Does the patient require triage? ---Yes  Related visit to physician within the last 2 weeks? ---No  Does the PT have any chronic conditions? (i.e. diabetes, asthma, etc.) ---Yes   post op x x 4 days  List chronic conditions. ---post op knee Thursday  Did the patient indicate they were pregnant? ---No     Guidelines    Guideline Title Affirmed Question Affirmed Notes  Chest Pain Major surgery in the past month    Final Disposition User   Go to ED Now Laural BenesJohnson, RN, Dondra SpryGail    Comments  already scheduled an appt for 10am this am.

## 2014-11-03 ENCOUNTER — Other Ambulatory Visit: Payer: Self-pay | Admitting: Internal Medicine

## 2014-11-28 ENCOUNTER — Ambulatory Visit (INDEPENDENT_AMBULATORY_CARE_PROVIDER_SITE_OTHER): Payer: 59 | Admitting: Internal Medicine

## 2014-11-28 ENCOUNTER — Encounter: Payer: Self-pay | Admitting: Internal Medicine

## 2014-11-28 VITALS — BP 118/78 | HR 92 | Temp 98.3°F | Wt 249.5 lb

## 2014-11-28 DIAGNOSIS — J029 Acute pharyngitis, unspecified: Secondary | ICD-10-CM

## 2014-11-28 DIAGNOSIS — B370 Candidal stomatitis: Secondary | ICD-10-CM

## 2014-11-28 LAB — POCT RAPID STREP A (OFFICE): RAPID STREP A SCREEN: NEGATIVE

## 2014-11-28 MED ORDER — NYSTATIN 100000 UNIT/ML MT SUSP: 5.0000 mL | Freq: Four times a day (QID) | OROMUCOSAL | Status: DC

## 2014-11-28 NOTE — Progress Notes (Signed)
HPI  Pt presents to the clinic today with c/o nasal congestion, ear fullness and sore throat. She reports this started 2 weeks ago but got much worse yesterday. She is not blowing anything out of her nose. She has had some pain with swallowing. She has run fevers up to 99.0. She has tried Vicks cold and flu, Advil without any relief. She does have a history of seasonal allergies. She has tried Zyrtec and Flonase without and relief. She has had sick contacts. She has had her flu shot. She did have a pneumonia back in early Feb 2016.  Review of Systems    Past Medical History  Diagnosis Date  . Migraines   . Seasonal allergies   . 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 Arthritis Mother   . Cancer Mother     Ovarian/uterine  . Hyperlipidemia Mother   . Hypertension Mother   . Arthritis Father   . Hypertension Father   . Asthma Father   . Asthma Sister   . Arthritis Paternal Grandmother     History   Social History  . Marital Status: Divorced    Spouse Name: N/A  . Number of Children: N/A  . Years of Education: N/A   Occupational History  . Not on file.   Social History Main Topics  . Smoking status: Never Smoker   . Smokeless tobacco: Never Used  . Alcohol Use: Yes     Comment: occasionally  . Drug Use: No  . Sexual Activity: Yes   Other Topics Concern  . Not on file   Social History Narrative    Allergies  Allergen Reactions  . Penicillins Shortness Of Breath  . Shellfish Allergy Shortness Of Breath  . Soap Itching    ANYTHING WITH FRAGRANCE     Constitutional: Positive headache, fatigue and fever. Denies abrupt weight changes.  HEENT:  Positive ear fullness, nasal congestion and sore throat. Denies eye redness, ear pain, ringing in the ears, wax buildup, runny nose or bloody  nose. Respiratory: Denies cough, difficulty breathing or shortness of breath.  Cardiovascular: Denies chest pain, chest tightness, palpitations or swelling in the hands or feet.   No other specific complaints in a complete review of systems (except as listed in HPI above).  Objective:  BP 118/78 mmHg  Pulse 92  Temp(Src) 98.3 F (36.8 C) (Oral)  Wt 249 lb 8 oz (113.172 kg)  SpO2 99%   General: Appears her stated age, well developed, well nourished in NAD. HEENT: Head: normal shape and size, no sinus tenderness noted; Ears: Tm's pink but intact, normal light reflex, + effusion bilaterally; Nose: mucosa boggy and moist, septum midline; Throat/Mouth: + PND. Teeth present, mucosa pink and moist, no lesions or ulcerations noted. White coating noted on tongue and back of throat. Neck: No adenopathy. Cardiovascular: Normal rate and rhythm. S1,S2 noted.  No murmur, rubs or gallops noted.  Pulmonary/Chest: Normal effort and positive vesicular breath sounds. No respiratory distress. No wheezes, rales or ronchi noted.      Assessment & Plan:   Sore throat secondary to oral thrush:  RST: negative eRx for Nysatin swish and swallow x 5 days Flonase 2 sprays each nostril for 3 days and then as needed Continue Zyrtec  RTC as needed or if symptoms  persist.

## 2014-11-28 NOTE — Progress Notes (Signed)
Pre visit review using our clinic review tool, if applicable. No additional management support is needed unless otherwise documented below in the visit note. 

## 2014-11-28 NOTE — Patient Instructions (Signed)
Thrush, Adult  Thrush, also called oral candidiasis, is a fungal infection that develops in the mouth and throat and on the tongue. It causes white patches to form on the mouth and tongue. Thrush is most common in older adults, but it can occur at any age.  Many cases of thrush are mild, but this infection can also be more serious. Thrush can be a recurring problem for people who have chronic illnesses or who take medicines that limit the body's ability to fight infection. Because these people have difficulty fighting infections, the fungus that causes thrush can spread throughout the body. This can cause life-threatening blood or organ infections. CAUSES  Thrush is usually caused by a yeast called Candida albicans. This fungus is normally present in small amounts in the mouth and on other mucous membranes. It usually causes no harm. However, when conditions are present that allow the fungus to grow uncontrolled, it invades surrounding tissues and becomes an infection. Less often, other Candida species can also lead to thrush.  RISK FACTORS Thrush is more likely to develop in the following people:  People with an impaired ability to fight infection (weakened immune system).   Older adults.   People with HIV.   People with diabetes.   People with dry mouth (xerostomia).   Pregnant women.   People with poor dental care, especially those who have false teeth.   People who use antibiotic medicines.  SIGNS AND SYMPTOMS  Thrush can be a mild infection that causes no symptoms. If symptoms develop, they may include:   A burning feeling in the mouth and throat. This can occur at the start of a thrush infection.   White patches that adhere to the mouth and tongue. The tissue around the patches may be red, raw, and painful. If rubbed (during tooth brushing, for example), the patches and the tissue of the mouth may bleed easily.   A bad taste in the mouth or difficulty tasting foods.    Cottony feeling in the mouth.   Pain during eating and swallowing. DIAGNOSIS  Your health care provider can usually diagnose thrush by looking in your mouth and asking you questions about your health.  TREATMENT  Medicines that help prevent the growth of fungi (antifungals) are the standard treatment for thrush. These medicines are either applied directly to the affected area (topical) or swallowed (oral). The treatment will depend on the severity of the condition.  Mild Thrush Mild cases of thrush may clear up with the use of an antifungal mouth rinse or lozenges. Treatment usually lasts about 14 days.  Moderate to Severe Thrush  More severe thrush infections that have spread to the esophagus are treated with an oral antifungal medicine. A topical antifungal medicine may also be used.   For some severe infections, a treatment period longer than 14 days may be needed.   Oral antifungal medicines are almost never used during pregnancy because the fetus may be harmed. However, if a pregnant woman has a rare, severe thrush infection that has spread to her blood, oral antifungal medicines may be used. In this case, the risk of harm to the mother and fetus from the severe thrush infection may be greater than the risk posed by the use of antifungal medicines.  Persistent or Recurrent Thrush For cases of thrush that do not go away or keep coming back, treatment may involve the following:   Treatment may be needed twice as long as the symptoms last.   Treatment will   include both oral and topical antifungal medicines.   People with weakened immune systems can take an antifungal medicine on a continuous basis to prevent thrush infections.  It is important to treat conditions that make you more likely to get thrush, such as diabetes or HIV.  HOME CARE INSTRUCTIONS   Only take over-the-counter or prescription medicine as directed by your health care provider. Talk to your health care  provider about an over-the-counter medicine called gentian violet, which kills bacteria and fungi.   Eat plain, unflavored yogurt as directed by your health care provider. Check the label to make sure the yogurt contains live cultures. This yogurt can help healthy bacteria grow in the mouth that can stop the growth of the fungus that causes thrush.   Try these measures to help reduce the discomfort of thrush:   Drink cold liquids such as water or iced tea.   Try flavored ice treats or frozen juices.   Eat foods that are easy to swallow, such as gelatin, ice cream, or custard.   If the patches in your mouth are painful, try drinking from a straw.   Rinse your mouth several times a day with a warm saltwater rinse. You can make the saltwater mixture with 1 tsp (6 g) of salt in 8 fl oz (0.2 L) of warm water.   If you wear dentures, remove the dentures before going to bed, brush them vigorously, and soak them in a cleaning solution as directed by your health care provider.   Women who are breastfeeding should clean their nipples with an antifungal medicine as directed by their health care provider. Dry the nipples after breastfeeding. Applying lanolin-containing body lotion may help relieve nipple soreness.  SEEK MEDICAL CARE IF:  Your symptoms are getting worse or are not improving within 7 days of starting treatment.   You have symptoms of spreading infection, such as white patches on the skin outside of the mouth.   You are nursing and you have redness, burning, or pain in the nipples that is not relieved with treatment.  MAKE SURE YOU:  Understand these instructions.  Will watch your condition.  Will get help right away if you are not doing well or get worse. Document Released: 05/25/2004 Document Revised: 06/20/2013 Document Reviewed: 04/02/2013 Exit[employer] Patient Information 2015 [employer]. This information is not intended to replace advice given to you by your  health care provider. Make sure you discuss any questions you have with your health care provider.  

## 2014-11-28 NOTE — Progress Notes (Signed)
   Subjective:    Patient ID: Mikayla AbbotLauren M Briggs, female    DOB: Dec 09, 1980, 34 y.o.   MRN: [id number]  HPI [name] is a 34 year old female who presents today with chief complaint of sore throat and nasal congestion.  Her throat has been sore for one day and she reports a temperture of 99.0 last night.  She also has clear nasal drainage for 2 weeks.  She denies any sick contacts.     Review of Systems  Constitutional: Negative for fever, chills and fatigue.  HENT: Positive for congestion, postnasal drip and sore throat. Negative for rhinorrhea and sinus pressure.   Respiratory: Negative for cough, shortness of breath and wheezing.   Cardiovascular: Negative for chest pain, palpitations and leg swelling.  Gastrointestinal: Negative.   Genitourinary: Negative.   Musculoskeletal: Negative.   Skin: Negative for color change, pallor, rash and wound.   Past Medical History  Diagnosis Date  . Migraines   . Seasonal allergies   . 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 Arthritis Mother   . Cancer Mother     Ovarian/uterine  . Hyperlipidemia Mother   . Hypertension Mother   . Arthritis Father   . Hypertension Father   . Asthma Father   . Asthma Sister   . Arthritis Paternal Grandmother    Current Outpatient Prescriptions on File Prior to Visit  Medication Sig Dispense Refill  . fexofenadine (ALLEGRA) 180 MG tablet Take 1 tablet (180 mg total) by mouth 2 (two) times daily. (Patient taking differently: Take 360 mg by mouth daily. ) 60 tablet 4  . levonorgestrel-ethinyl estradiol (SEASONALE,INTROVALE,JOLESSA) 0.15-0.03 MG tablet Take 1 tablet by mouth daily.    . montelukast (SINGULAIR) 10 MG tablet TAKE 1 TABLET BY MOUTH AT BEDTIME 30 tablet 3  . ondansetron (ZOFRAN) 4 MG tablet Take 1 tablet (4 mg  total) by mouth every 8 (eight) hours as needed for nausea or vomiting. 40 tablet 0  . oxyCODONE-acetaminophen (ROXICET) 5-325 MG per tablet Take 1-2 tablets by mouth every 4 (four) hours as needed. 60 tablet 0   No current facility-administered medications on file prior to visit.        Objective:   Physical Exam  Constitutional: She is oriented to person, place, and time. She appears well-developed and well-nourished.  HENT:  Head: Normocephalic and atraumatic.  Right Ear: External ear normal.  Left Ear: External ear normal.  Thrush present on tongue and throat  Neck: Normal range of motion. Neck supple.  Cardiovascular: Normal rate, regular rhythm and normal heart sounds.   Pulmonary/Chest: Effort normal and breath sounds normal.  Musculoskeletal: Normal range of motion.  Lymphadenopathy:    She has no cervical adenopathy.  Neurological: She is alert and oriented to person, place, and time.  Skin: Skin is warm and dry.  Psychiatric: She has a normal mood and affect.          Assessment & Plan:  1. Oral Thrush Rx for nystatin oral solution x 5 days.

## 2014-11-28 NOTE — Addendum Note (Signed)
Addended by: Roena MaladyEVONTENNO, Jayshun Galentine Y on: 11/28/2014 02:40 PM   Modules accepted: Orders

## 2015-01-06 ENCOUNTER — Encounter: Payer: Self-pay | Admitting: Primary Care

## 2015-01-06 ENCOUNTER — Ambulatory Visit (INDEPENDENT_AMBULATORY_CARE_PROVIDER_SITE_OTHER): Payer: 59 | Admitting: Primary Care

## 2015-01-06 ENCOUNTER — Ambulatory Visit: Payer: Self-pay | Admitting: Internal Medicine

## 2015-01-06 VITALS — BP 122/88 | HR 88 | Temp 97.5°F | Ht 64.5 in | Wt 252.1 lb

## 2015-01-06 DIAGNOSIS — H6692 Otitis media, unspecified, left ear: Secondary | ICD-10-CM | POA: Diagnosis not present

## 2015-01-06 MED ORDER — SULFAMETHOXAZOLE-TRIMETHOPRIM 800-160 MG PO TABS: 1.0000 | ORAL_TABLET | Freq: Two times a day (BID) | ORAL | Status: DC

## 2015-01-06 NOTE — Patient Instructions (Signed)
Start taking the antibiotic for your left ear infection. Take one tablet by mouth twice daily for seven days. Continue taking the Allegra, Singulair, nasal spray. Let me know if you've had no improvement in the next 5 days. The ear fullness may take additional time to completely resolve. Take care!

## 2015-01-06 NOTE — Progress Notes (Signed)
Subjective:    Patient ID: Mikayla Briggs, female    DOB: 1981-02-27, 34 y.o.   MRN: [id number]  HPI  Mikayla Briggs is a 34 year old female who presents today with a chief complaint of ear fullness. The fullness is present to bilateral ears with worsening fullness to left side. The fullness began one week ago, dissipated after a few days and returned last Friday. She has had nasal congestion for the past 10 days for which she's been taking allegra, Singulair, sudafed PE, and mucinex. She denies fevers, pain, sinus pressure, sore throat, and reports to be feeling well overall.  Review of Systems  Constitutional: Negative for fever and chills.  HENT: Positive for congestion and hearing loss. Negative for ear pain, postnasal drip, rhinorrhea, sinus pressure and sore throat.   Respiratory: Negative for cough and shortness of breath.   Cardiovascular: Negative for chest pain.  Gastrointestinal: Negative for nausea and vomiting.  Musculoskeletal: Negative for myalgias.       Past Medical History  Diagnosis Date  . Migraines   . Seasonal allergies   . 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 Marital Status: Divorced    Spouse Name: N/A  . Number of Children: N/A  . Years of Education: N/A   Occupational History  . Not on file.   Social History Main Topics  . Smoking status: Never Smoker   . Smokeless tobacco: Never Used  . Alcohol Use: 0.0 oz/week    0 Standard drinks or equivalent per week     Comment: occasionally  . Drug Use: No  . Sexual Activity: Yes   Other Topics Concern  . Not on file   Social History Narrative    Past Surgical History  Procedure Laterality Date  . Knee surgery Left 2000  . Wisdom tooth extraction    . Tonsillectomy and adenoidectomy    . Cholecystectomy  11/2003  . Tonsillectomy  and adenoidectomy    . Knee arthroscopy Left   . Knee arthroscopy with medial menisectomy Right 10/10/2014    Procedure: RIGHT KNEE ARTHROSCOPY; RESECTION PLICA; CHONDROPLASTY, AND PARTIAL MEDIAL MENISCECTOMY;  Surgeon: Loreta Ave, MD;  Location: [facility];  Service: Orthopedics;  Laterality: Right;    Family History  Problem Relation Age of Onset  . Arthritis Mother   . Cancer Mother     Ovarian/uterine  . Hyperlipidemia Mother   . Hypertension Mother   . Arthritis Father   . Hypertension Father   . Asthma Father   . Asthma Sister   . Arthritis Paternal Grandmother     Allergies  Allergen Reactions  . Penicillins Shortness Of Breath  . Shellfish Allergy Shortness Of Breath  . Soap Itching    ANYTHING WITH FRAGRANCE    Current Outpatient Prescriptions on File Prior to Visit  Medication Sig Dispense Refill  . fexofenadine (ALLEGRA) 180 MG tablet Take 1 tablet (180 mg total) by mouth 2 (two) times daily. (Patient taking differently: Take 360 mg by mouth daily. ) 60 tablet 4  . levonorgestrel-ethinyl estradiol (SEASONALE,INTROVALE,JOLESSA) 0.15-0.03 MG tablet Take 1 tablet by mouth daily.    . meloxicam (MOBIC) 15 MG tablet Take 15 mg by mouth daily.   3  . montelukast (SINGULAIR) 10 MG tablet TAKE 1 TABLET BY MOUTH AT  BEDTIME 30 tablet 3  . nystatin (MYCOSTATIN) 100000 UNIT/ML suspension Take 5 mLs (500,000 Units total) by mouth 4 (four) times daily. 120 mL 0  . ondansetron (ZOFRAN) 4 MG tablet Take 1 tablet (4 mg total) by mouth every 8 (eight) hours as needed for nausea or vomiting. 40 tablet 0  . oxyCODONE-acetaminophen (ROXICET) 5-325 MG per tablet Take 1-2 tablets by mouth every 4 (four) hours as needed. 60 tablet 0  . traMADol (ULTRAM) 50 MG tablet Take 50 mg by mouth every 8 (eight) hours as needed.  0   No current facility-administered medications on file prior to visit.    BP 122/88 mmHg  Pulse 88  Temp(Src) 97.5 F (36.4 C) (Oral)  Ht 5' 4.5"  (1.638 m)  Wt 252 lb 1.9 oz (114.361 kg)  BMI 42.62 kg/m2  SpO2 97%    Objective:   Physical Exam  Constitutional: She is oriented to person, place, and time. She appears well-developed.  Does not appear sickly.  HENT:  Right Ear: Tympanic membrane and ear canal normal.  Left Ear: Tympanic membrane is injected and erythematous. A middle ear effusion is present.  Nose: Nose normal.  Mouth/Throat: Oropharynx is clear and moist.  Eyes: Conjunctivae are normal. Pupils are equal, round, and reactive to light.  Neck:  Small enlarged node to left cervical chain  Cardiovascular: Normal rate and regular rhythm.   Pulmonary/Chest: Effort normal and breath sounds normal.  Neurological: She is alert and oriented to person, place, and time.  Skin: Skin is warm and dry.          Assessment & Plan:  AOM to left ear with effusion:  Left TM is injected with erythema and moderate amount of effusion. Canal WNL.  Right TM unremarkable. Due to duration of symptoms with no improvement, RX for Septra DS provided. Continue allegra, singulair, nasal spray. Instructions provided that she may continue to have the ear fullness for several weeks but should expect a gradual improvement. Follow up if no improvement by Friday.

## 2015-01-06 NOTE — Progress Notes (Signed)
Pre visit review using our clinic review tool, if applicable. No additional management support is needed unless otherwise documented below in the visit note. 

## 2015-01-10 ENCOUNTER — Telehealth: Payer: Self-pay | Admitting: *Deleted

## 2015-01-10 DIAGNOSIS — H6692 Otitis media, unspecified, left ear: Secondary | ICD-10-CM

## 2015-01-10 MED ORDER — PREDNISONE 20 MG PO TABS: ORAL_TABLET | ORAL | Status: DC

## 2015-01-10 NOTE — Telephone Encounter (Signed)
Spoke with patient who reports frequent encounters of this in the past and reports prednisone will typically help to reduce symptoms. Short burst of prednisone called into pharmacy. She is to call me Wednesday next week to provide an update. She verbalized understanding.

## 2015-01-10 NOTE — Telephone Encounter (Signed)
Patient was seen and treated for left OM on 01/06/15, she is calling to follow up.  She reports having difficulty hearing out of the left ear and c/o a sense of "fullness".  She denies pain.  Advised to continue and complete abx and may add ibuprofen prn.  She would like to discuss this with Jae DireKate, she feels like she may need a course of steroid.  Please advise.

## 2015-01-15 ENCOUNTER — Telehealth: Payer: Self-pay | Admitting: Primary Care

## 2015-01-15 NOTE — Telephone Encounter (Signed)
Left voicemail for patient to call back. Patient called back. Schedule follow up apt on 01/16/15

## 2015-01-15 NOTE — Telephone Encounter (Signed)
Will you please call Mikayla Briggs and ask her to come in again for re-evaluation? I'd like to take another look at her ears.  Thanks.

## 2015-01-15 NOTE — Telephone Encounter (Signed)
Pt left v/m with update of ear issue; prednisone did improve the congestion in eustachian tube where pt could hear again; last night pt started experiencing pain in left ear again after pt washing her hair; today pt said ear feels stopped up again. Pt request cb.

## 2015-01-16 ENCOUNTER — Ambulatory Visit (INDEPENDENT_AMBULATORY_CARE_PROVIDER_SITE_OTHER): Payer: 59 | Admitting: Primary Care

## 2015-01-16 ENCOUNTER — Encounter: Payer: Self-pay | Admitting: Primary Care

## 2015-01-16 VITALS — BP 122/84 | HR 80 | Temp 97.6°F | Ht 64.5 in | Wt 256.8 lb

## 2015-01-16 DIAGNOSIS — H9202 Otalgia, left ear: Secondary | ICD-10-CM

## 2015-01-16 MED ORDER — FLUTICASONE PROPIONATE 50 MCG/ACT NA SUSP: 2.0000 | Freq: Every day | NASAL | Status: DC

## 2015-01-16 NOTE — Patient Instructions (Signed)
Start Flonase nasal steroid. Use 2 puff in each nostril once daily. Call me in one week with an update. It was nice seeing you again!  Otitis Media With Effusion Otitis media with effusion is the presence of fluid in the middle ear. This is a common problem in children, which often follows ear infections. It may be present for weeks or longer after the infection. Unlike an acute ear infection, otitis media with effusion refers only to fluid behind the ear drum and not infection. Children with repeated ear and sinus infections and allergy problems are the most likely to get otitis media with effusion. CAUSES  The most frequent cause of the fluid buildup is dysfunction of the eustachian tubes. These are the tubes that drain fluid in the ears to the back of the nose (nasopharynx). SYMPTOMS   The main symptom of this condition is hearing loss. As a result, you or your child may:  Listen to the TV at a loud volume.  Not respond to questions.  Ask "what" often when spoken to.  Mistake or confuse one sound or word for another.  There may be a sensation of fullness or pressure but usually not pain. DIAGNOSIS   Your health care provider will diagnose this condition by examining you or your child's ears.  Your health care provider may test the pressure in you or your child's ear with a tympanometer.  A hearing test may be conducted if the problem persists. TREATMENT   Treatment depends on the duration and the effects of the effusion.  Antibiotics, decongestants, nose drops, and cortisone-type drugs (tablets or nasal spray) may not be helpful.  Children with persistent ear effusions may have delayed language or behavioral problems. Children at risk for developmental delays in hearing, learning, and speech may require referral to a specialist earlier than children not at risk.  You or your child's health care provider may suggest a referral to an ear, nose, and throat surgeon for treatment.  The following may help restore normal hearing:  Drainage of fluid.  Placement of ear tubes (tympanostomy tubes).  Removal of adenoids (adenoidectomy). HOME CARE INSTRUCTIONS   Avoid secondhand smoke.  Infants who are breastfed are less likely to have this condition.  Avoid feeding infants while they are lying flat.  Avoid known environmental allergens.  Avoid people who are sick. SEEK MEDICAL CARE IF:   Hearing is not better in 3 months.  Hearing is worse.  Ear pain.  Drainage from the ear.  Dizziness. MAKE SURE YOU:   Understand these instructions.  Will watch your condition.  Will get help right away if you are not doing well or get worse. Document Released: 10/07/2004 Document Revised: 01/14/2014 Document Reviewed: 03/27/2013 [facility] Patient Information 2015 [place], [place]. This information is not intended to replace advice given to you by your health care provider. Make sure you discuss any questions you have with your health care provider.

## 2015-01-16 NOTE — Progress Notes (Signed)
Pre visit review using our clinic review tool, if applicable. No additional management support is needed unless otherwise documented below in the visit note. 

## 2015-01-16 NOTE — Progress Notes (Signed)
Subjective:    Patient ID: [name], female    DOB: [birth year], 34 y.o.   MRN: [id number]  HPI  Ms. Janee Mornhompson is a 34 year old female who presents today for follow up of left ear pain and effusion. Her symptom began with left ear fullness and was found to have a moderate effusion to her left ear with otitis media to her right. She was placed on Septra DS tablets with resolution of her right otitis media. She continued to report fullness so she was placed on a short dose of prednisone. She reports the prednisone helped to reduce her fullness and overall she's feeling better today. She does have pain to the left side deeper in the eustachian tubes with some fullness. Denies fevers, congestion, rhinorrhea.   Review of Systems  Constitutional: Negative for fever and chills.  HENT: Positive for ear pain. Negative for congestion, postnasal drip, rhinorrhea, sinus pressure, sneezing and sore throat.   Eyes: Negative for itching.  Respiratory: Negative for shortness of breath.   Cardiovascular: Negative for chest pain.  Gastrointestinal: Negative for nausea.       Past Medical History  Diagnosis Date  . Migraines   . Seasonal allergies   . 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 Marital Status: Divorced    Spouse Name: N/A  . Number of Children: N/A  . Years of Education: N/A   Occupational History  . Not on file.   Social History Main Topics  . Smoking status: Never Smoker   . Smokeless tobacco: Never Used  . Alcohol Use: 0.0 oz/week    0 Standard drinks or equivalent per week     Comment: occasionally  . Drug Use: No  . Sexual Activity: Yes   Other Topics Concern  . Not on file   Social History Narrative    Past Surgical History  Procedure Laterality Date  . Knee surgery Left 2000  . Wisdom tooth  extraction    . Tonsillectomy and adenoidectomy    . Cholecystectomy  11/2003  . Tonsillectomy and adenoidectomy    . Knee arthroscopy Left   . Knee arthroscopy with medial menisectomy Right 10/10/2014    Procedure: RIGHT KNEE ARTHROSCOPY; RESECTION PLICA; CHONDROPLASTY, AND PARTIAL MEDIAL MENISCECTOMY;  Surgeon: Loreta Aveaniel F Murphy, MD;  Location: [facility];  Service: Orthopedics;  Laterality: Right;    Family History  Problem Relation Age of Onset  . Arthritis Mother   . Cancer Mother     Ovarian/uterine  . Hyperlipidemia Mother   . Hypertension Mother   . Arthritis Father   . Hypertension Father   . Asthma Father   . Asthma Sister   . Arthritis Paternal Grandmother     Allergies  Allergen Reactions  . Penicillins Shortness Of Breath  . Shellfish Allergy Shortness Of Breath  . Soap Itching    ANYTHING WITH FRAGRANCE    Current Outpatient Prescriptions on File Prior to Visit  Medication Sig Dispense Refill  . fexofenadine (ALLEGRA) 180 MG tablet Take 1 tablet (180 mg total) by mouth 2 (two) times daily. (Patient taking differently: Take 360 mg by mouth daily. ) 60 tablet 4  . levonorgestrel-ethinyl estradiol (SEASONALE,INTROVALE,JOLESSA) 0.15-0.03 MG tablet Take 1 tablet by mouth daily.    . meloxicam (MOBIC) 15 MG tablet  Take 15 mg by mouth daily.   3  . montelukast (SINGULAIR) 10 MG tablet TAKE 1 TABLET BY MOUTH AT BEDTIME 30 tablet 3  . ondansetron (ZOFRAN) 4 MG tablet Take 1 tablet (4 mg total) by mouth every 8 (eight) hours as needed for nausea or vomiting. 40 tablet 0  . oxyCODONE-acetaminophen (ROXICET) 5-325 MG per tablet Take 1-2 tablets by mouth every 4 (four) hours as needed. 60 tablet 0  . traMADol (ULTRAM) 50 MG tablet Take 50 mg by mouth every 8 (eight) hours as needed.  0   No current facility-administered medications on file prior to visit.    BP 122/84 mmHg  Pulse 80  Temp(Src) 97.6 F (36.4 C) (Oral)  Ht 5' 4.5" (1.638 m)  Wt 256 lb 12.8  oz (116.484 kg)  BMI 43.41 kg/m2  SpO2 98%    Objective:   Physical Exam  Constitutional: She is oriented to person, place, and time. She appears well-developed.  HENT:  Right Ear: Tympanic membrane and ear canal normal.  Left Ear: Tympanic membrane and ear canal normal.  No effusion noted bilaterally  Neck: Neck supple.  Cardiovascular: Normal rate and regular rhythm.   Pulmonary/Chest: Effort normal.  Lymphadenopathy:    She has no cervical adenopathy.  Neurological: She is alert and oriented to person, place, and time.  Skin: Skin is warm and dry.          Assessment & Plan:  Ear fullness:  Improved since Prednisone, but still has some fullness. TM's are unremarkable, no obvious effusion noted. Start Flonase daily., continue allegra and Singulair. Declines referral to ENT at this time. She is to call in one week with an update in her condition.

## 2015-01-20 ENCOUNTER — Ambulatory Visit (INDEPENDENT_AMBULATORY_CARE_PROVIDER_SITE_OTHER): Payer: 59 | Admitting: Internal Medicine

## 2015-01-20 ENCOUNTER — Encounter: Payer: Self-pay | Admitting: Internal Medicine

## 2015-01-20 VITALS — BP 118/80 | HR 75 | Temp 97.9°F | Wt 254.0 lb

## 2015-01-20 DIAGNOSIS — G43009 Migraine without aura, not intractable, without status migrainosus: Secondary | ICD-10-CM

## 2015-01-20 MED ORDER — ONDANSETRON HCL 4 MG/2ML IJ SOLN
4.0000 mg | Freq: Once | INTRAMUSCULAR | Status: AC
  Administered 2015-01-20: 4 mg via INTRAMUSCULAR

## 2015-01-20 MED ORDER — SUMATRIPTAN SUCCINATE 25 MG PO TABS: 25.0000 mg | ORAL_TABLET | ORAL | Status: DC | PRN

## 2015-01-20 MED ORDER — KETOROLAC TROMETHAMINE 30 MG/ML IJ SOLN
30.0000 mg | Freq: Once | INTRAMUSCULAR | Status: AC
  Administered 2015-01-20: 30 mg via INTRAMUSCULAR

## 2015-01-20 NOTE — Addendum Note (Signed)
Addended by: Roena MaladyEVONTENNO, Vaudie Engebretsen Y on: 01/20/2015 02:41 PM   Modules accepted: Orders

## 2015-01-20 NOTE — Patient Instructions (Signed)

## 2015-01-20 NOTE — Progress Notes (Signed)
Subjective:    Patient ID: Mikayla AbbotLauren M Briggs, female    DOB: 10/04/1980, 34 y.o.   MRN: [id number]  HPI  Pt presenting to the clinic today with c/o headache x 2 days. She describes the pain as a throbbing and sharp pain that is "all over my head". She has some light sensitivity and nausea. She does have a history of migraine headaches in the past and feels like they are starting to occur more frequently now. She has tried Tramadol and Percocet with minimal relief of her symptoms. In the past Phenergan and Toradol injections have helped. No recent head trauma. She denies numbness, tingling, muscle weakness, blurred vision.    Review of Systems      Past Medical History  Diagnosis Date  . Migraines   . Seasonal allergies   . 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 fexofenadine (ALLEGRA) 180 MG tablet Take 1 tablet (180 mg total) by mouth 2 (two) times daily. (Patient taking differently: Take 360 mg by mouth daily. ) 60 tablet 4  . fluticasone (FLONASE) 50 MCG/ACT nasal spray Place 2 sprays into both nostrils daily. 16 g 1  . levonorgestrel-ethinyl estradiol (SEASONALE,INTROVALE,JOLESSA) 0.15-0.03 MG tablet Take 1 tablet by mouth daily.    . meloxicam (MOBIC) 15 MG tablet Take 15 mg by mouth daily.   3  . montelukast (SINGULAIR) 10 MG tablet TAKE 1 TABLET BY MOUTH AT BEDTIME 30 tablet 3  . ondansetron (ZOFRAN) 4 MG tablet Take 1 tablet (4 mg total) by mouth every 8 (eight) hours as needed for nausea or vomiting. 40 tablet 0  . oxyCODONE-acetaminophen (ROXICET) 5-325 MG per tablet Take 1-2 tablets by mouth every 4 (four) hours as needed. 60 tablet 0  . traMADol (ULTRAM) 50 MG tablet Take 50 mg by mouth every 8 (eight) hours as needed.  0   No current facility-administered medications for  this visit.    Allergies  Allergen Reactions  . Penicillins Shortness Of Breath  . Shellfish Allergy Shortness Of Breath  . Soap Itching    ANYTHING WITH FRAGRANCE    Family History  Problem Relation Age of Onset  . Arthritis Mother   . Cancer Mother     Ovarian/uterine  . Hyperlipidemia Mother   . Hypertension Mother   . Arthritis Father   . Hypertension Father   . Asthma Father   . Asthma Sister   . Arthritis Paternal Grandmother     History   Social History  . Marital Status: Divorced    Spouse Name: N/A  . Number of Children: N/A  . Years of Education: N/A   Occupational History  . Not on file.   Social History Main Topics  . Smoking status: Never Smoker   . Smokeless tobacco: Never Used  . Alcohol Use: 0.0 oz/week    0 Standard drinks or equivalent per week     Comment: occasionally  . Drug Use: No  . Sexual Activity: Yes   Other Topics Concern  . Not on file   Social History Narrative     Constitutional: Pt reports headache. Denies fever, malaise, fatigue, or abrupt weight changes.  HEENT: Pt reports photophobia. Denies eye pain, eye redness, ear pain, ringing in the ears, wax buildup, runny nose, nasal congestion, bloody nose,  or sore throat. Cardiovascular: Denies chest pain, chest tightness, palpitations or swelling in the hands or feet.  Skin: Denies redness, rashes, lesions or ulcercations.  Neurological: Denies dizziness, numbness, tingling, difficulty with memory, difficulty with speech or problems with balance and coordination.   No other specific complaints in a complete review of systems (except as listed in HPI above).  Objective:   Physical Exam   BP 118/80 mmHg  Pulse 75  Temp(Src) 97.9 F (36.6 C) (Oral)  Wt 254 lb (115.214 kg)  SpO2 98% Wt Readings from Last 3 Encounters:  01/20/15 254 lb (115.214 kg)  01/16/15 256 lb 12.8 oz (116.484 kg)  01/06/15 252 lb 1.9 oz (114.361 kg)    General: Appears her stated age, obese in  NAD. Skin: Warm, dry and intact. No rashes, lesions or ulcerations noted. HEENT: Head: normal shape and size; Eyes: sclera white, no icterus, conjunctiva pink, PERRLA and EOMs intact;  Cardiovascular: Normal rate and rhythm. S1,S2 noted.  No murmur, rubs or gallops noted.  Pulmonary/Chest: Normal effort and positive vesicular breath sounds. No respiratory distress. No wheezes, rales or ronchi noted.  Neurological: Alert and oriented. Coordination normal.   BMET    Component Value Date/Time   NA 140 04/01/2014 2234   NA 137 01/02/2014 1152   K 4.4 04/01/2014 2234   K 3.9 01/02/2014 1152   CL 105 04/01/2014 2234   CL 102 01/02/2014 1152   CO2 25 04/01/2014 2234   CO2 26 01/02/2014 1152   GLUCOSE 107* 04/01/2014 2234   GLUCOSE 105* 01/02/2014 1152   BUN 10 04/01/2014 2234   BUN 9 01/02/2014 1152   CREATININE 0.83 04/01/2014 2234   CREATININE 0.81 01/02/2014 1152   CALCIUM 9.1 04/01/2014 2234   CALCIUM 8.8 01/02/2014 1152   GFRNONAA >90 04/01/2014 2234   GFRNONAA >60 01/02/2014 1152   GFRAA >90 04/01/2014 2234   GFRAA >60 01/02/2014 1152    Lipid Panel  No results found for: CHOL, TRIG, HDL, CHOLHDL, VLDL, LDLCALC  CBC    Component Value Date/Time   WBC 11.6* 04/01/2014 2234   WBC 9.5 01/02/2014 1152   RBC 4.23 04/01/2014 2234   RBC 4.86 01/02/2014 1152   HGB 14.4 10/10/2014 0751   HGB 14.5 01/02/2014 1152   HCT 37.1 04/01/2014 2234   HCT 43.6 01/02/2014 1152   PLT 272 04/01/2014 2234   PLT 280 01/02/2014 1152   MCV 87.7 04/01/2014 2234   MCV 90 01/02/2014 1152   MCH 29.1 04/01/2014 2234   MCH 29.9 01/02/2014 1152   MCHC 33.2 04/01/2014 2234   MCHC 33.3 01/02/2014 1152   RDW 12.8 04/01/2014 2234   RDW 12.7 01/02/2014 1152   LYMPHSABS 3.5 04/01/2014 2234   LYMPHSABS 2.4 01/02/2014 1152   MONOABS 1.0 04/01/2014 2234   MONOABS 0.7 01/02/2014 1152   EOSABS 0.4 04/01/2014 2234   EOSABS 0.4 01/02/2014 1152   BASOSABS 0.0 04/01/2014 2234   BASOSABS 0.1 01/02/2014  1152    Hgb A1C No results found for: HGBA1C      Assessment & Plan:   Migraine:  Zofran 4 mg IM today Toradol 30 mg IM today eRx for Imitrex, stop Tramadol and Percocet for migraines  RTC as needed or if symptoms persist or worsen

## 2015-01-20 NOTE — Progress Notes (Signed)
Pre visit review using our clinic review tool, if applicable. No additional management support is needed unless otherwise documented below in the visit note. 

## 2015-03-12 ENCOUNTER — Encounter: Payer: Self-pay | Admitting: Internal Medicine

## 2015-03-12 ENCOUNTER — Other Ambulatory Visit: Payer: Self-pay | Admitting: Internal Medicine

## 2015-03-12 ENCOUNTER — Ambulatory Visit (INDEPENDENT_AMBULATORY_CARE_PROVIDER_SITE_OTHER): Payer: 59 | Admitting: Internal Medicine

## 2015-03-12 ENCOUNTER — Encounter (INDEPENDENT_AMBULATORY_CARE_PROVIDER_SITE_OTHER): Payer: Self-pay

## 2015-03-12 VITALS — BP 114/78 | HR 74 | Temp 98.1°F | Wt 251.0 lb

## 2015-03-12 DIAGNOSIS — K921 Melena: Secondary | ICD-10-CM | POA: Diagnosis not present

## 2015-03-12 DIAGNOSIS — R5383 Other fatigue: Secondary | ICD-10-CM

## 2015-03-12 DIAGNOSIS — R109 Unspecified abdominal pain: Secondary | ICD-10-CM

## 2015-03-12 DIAGNOSIS — R197 Diarrhea, unspecified: Secondary | ICD-10-CM

## 2015-03-12 LAB — CBC
HCT: 39.6 % (ref 36.0–46.0)
Hemoglobin: 13.4 g/dL (ref 12.0–15.0)
MCHC: 33.7 g/dL (ref 30.0–36.0)
MCV: 88.5 fl (ref 78.0–100.0)
Platelets: 303 10*3/uL (ref 150.0–400.0)
RBC: 4.48 Mil/uL (ref 3.87–5.11)
RDW: 13.4 % (ref 11.5–15.5)
WBC: 9.9 10*3/uL (ref 4.0–10.5)

## 2015-03-12 LAB — COMPREHENSIVE METABOLIC PANEL
ALBUMIN: 4 g/dL (ref 3.5–5.2)
ALT: 17 U/L (ref 0–35)
AST: 24 U/L (ref 0–37)
Alkaline Phosphatase: 39 U/L (ref 39–117)
BUN: 7 mg/dL (ref 6–23)
CALCIUM: 9.3 mg/dL (ref 8.4–10.5)
CHLORIDE: 105 meq/L (ref 96–112)
CO2: 25 meq/L (ref 19–32)
Creatinine, Ser: 0.74 mg/dL (ref 0.40–1.20)
GFR: 95.63 mL/min (ref >60.00)
Glucose, Bld: 86 mg/dL (ref 70–99)
Potassium: 4.2 mEq/L (ref 3.5–5.1)
SODIUM: 137 meq/L (ref 135–145)
Total Bilirubin: 0.7 mg/dL (ref 0.2–1.2)
Total Protein: 7 g/dL (ref 6.0–8.3)

## 2015-03-12 LAB — IBC PANEL
IRON: 97 ug/dL (ref 42–145)
SATURATION RATIOS: 26.8 % (ref 20.0–50.0)
TRANSFERRIN: 259 mg/dL (ref 212.0–360.0)

## 2015-03-12 LAB — LIPASE: Lipase: 27 U/L (ref 11.0–59.0)

## 2015-03-12 LAB — AMYLASE: Amylase: 39 U/L (ref 27–131)

## 2015-03-12 NOTE — Progress Notes (Signed)
Subjective:    Patient ID: Mikayla Briggs, female    DOB: [birth year], 34 y.o.   MRN: [id number]  HPI  Pt presents to the clinic today with c/o abdominal cramping and diarrhea. She reports this has been going on for the last 5-6 months. She denies a change in her diet around that time. She has gotten where she has a BM after every time she eats. Sometimes her stools is loose and watery. There are other times that she has noticed her stool is real dark, almost black, although she has not seen bright red blood in her stool. The bowel movement does not relieve the abdominal cramping. Nothing she has found seems to make it better or worse. She has had a cholecystectomy in 2005. She denies hemorrhoids or reflux symptoms. She has not tried anything OTC.  Review of Systems      Past Medical History  Diagnosis Date  . Migraines   . Seasonal allergies   . 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 fexofenadine (ALLEGRA) 180 MG tablet Take 1 tablet (180 mg total) by mouth 2 (two) times daily. (Patient taking differently: Take 360 mg by mouth daily. ) 60 tablet 4  . fluticasone (FLONASE) 50 MCG/ACT nasal spray Place 2 sprays into both nostrils daily. 16 g 1  . levonorgestrel-ethinyl estradiol (SEASONALE,INTROVALE,JOLESSA) 0.15-0.03 MG tablet Take 1 tablet by mouth daily.    . meloxicam (MOBIC) 15 MG tablet Take 15 mg by mouth daily.   3  . montelukast (SINGULAIR) 10 MG tablet TAKE 1 TABLET BY MOUTH AT BEDTIME 30 tablet 3  . ondansetron (ZOFRAN) 4 MG tablet Take 1 tablet (4 mg total) by mouth every 8 (eight) hours as needed for nausea or vomiting. 40 tablet 0  . oxyCODONE-acetaminophen (ROXICET) 5-325 MG per tablet Take 1-2 tablets by mouth every 4 (four) hours as needed. 60 tablet 0  . SUMAtriptan  (IMITREX) 25 MG tablet Take 1 tablet (25 mg total) by mouth every 2 (two) hours as needed for migraine. 10 tablet 0  . traMADol (ULTRAM) 50 MG tablet Take 50 mg by mouth every 8 (eight) hours as needed.  0   No current facility-administered medications for this visit.    Allergies  Allergen Reactions  . Penicillins Shortness Of Breath  . Shellfish Allergy Shortness Of Breath  . Soap Itching    ANYTHING WITH FRAGRANCE    Family History  Problem Relation Age of Onset  . Arthritis Mother   . Cancer Mother     Ovarian/uterine  . Hyperlipidemia Mother   . Hypertension Mother   . Arthritis Father   . Hypertension Father   . Asthma Father   . Asthma Sister   . Arthritis Paternal Grandmother     History   Social History  . Marital Status: Divorced    Spouse Name: N/A  . Number of Children: N/A  . Years of Education: N/A   Occupational History  . Not on file.   Social History Main Topics  . Smoking status: Never Smoker   . Smokeless tobacco: Never Used  . Alcohol Use: 0.0 oz/week    0 Standard drinks or equivalent per week     Comment: occasionally  . Drug Use: No  . Sexual Activity: Yes  Other Topics Concern  . Not on file   Social History Narrative     Constitutional: Pt reports fatigue. Denies fever, malaise, headache or abrupt weight changes.  Respiratory: Denies difficulty breathing, shortness of breath, cough or sputum production.   Cardiovascular: Denies chest pain, chest tightness, palpitations or swelling in the hands or feet.  Gastrointestinal: Pt reports abdominal cramping and diarrhea. Denies bloating, constipation,  or blood in the stool.  GU: Denies urgency, frequency, pain with urination, burning sensation, blood in urine, odor or discharge.  No other specific complaints in a complete review of systems (except as listed in HPI above).  Objective:   Physical Exam  BP 114/78 mmHg  Pulse 74  Temp(Src) 98.1 F (36.7 C) (Oral)  Wt 251 lb  (113.853 kg)  SpO2 99% Wt Readings from Last 3 Encounters:  03/12/15 251 lb (113.853 kg)  01/20/15 254 lb (115.214 kg)  01/16/15 256 lb 12.8 oz (116.484 kg)    General: Appears her stated age, obese in NAD. Cardiovascular: Normal rate and rhythm. S1,S2 noted.  No murmur, rubs or gallops noted.  Pulmonary/Chest: Normal effort and positive vesicular breath sounds. No respiratory distress. No wheezes, rales or ronchi noted.  Abdomen: Soft and tender in the RUQ/epigastric area. Normal bowel sounds, no bruits noted. No distention or masses noted. Liver, spleen and kidneys non palpable. Rectal: Normal rectal tone. No external or internal hemorrhoid noted. No rectal mass noted.  Neurological: Alert and oriented.   BMET    Component Value Date/Time   NA 140 04/01/2014 2234   NA 137 01/02/2014 1152   K 4.4 04/01/2014 2234   K 3.9 01/02/2014 1152   CL 105 04/01/2014 2234   CL 102 01/02/2014 1152   CO2 25 04/01/2014 2234   CO2 26 01/02/2014 1152   GLUCOSE 107* 04/01/2014 2234   GLUCOSE 105* 01/02/2014 1152   BUN 10 04/01/2014 2234   BUN 9 01/02/2014 1152   CREATININE 0.83 04/01/2014 2234   CREATININE 0.81 01/02/2014 1152   CALCIUM 9.1 04/01/2014 2234   CALCIUM 8.8 01/02/2014 1152   GFRNONAA >90 04/01/2014 2234   GFRNONAA >60 01/02/2014 1152   GFRAA >90 04/01/2014 2234   GFRAA >60 01/02/2014 1152    Lipid Panel  No results found for: CHOL, TRIG, HDL, CHOLHDL, VLDL, LDLCALC  CBC    Component Value Date/Time   WBC 11.6* 04/01/2014 2234   WBC 9.5 01/02/2014 1152   RBC 4.23 04/01/2014 2234   RBC 4.86 01/02/2014 1152   HGB 14.4 10/10/2014 0751   HGB 14.5 01/02/2014 1152   HCT 37.1 04/01/2014 2234   HCT 43.6 01/02/2014 1152   PLT 272 04/01/2014 2234   PLT 280 01/02/2014 1152   MCV 87.7 04/01/2014 2234   MCV 90 01/02/2014 1152   MCH 29.1 04/01/2014 2234   MCH 29.9 01/02/2014 1152   MCHC 33.2 04/01/2014 2234   MCHC 33.3 01/02/2014 1152   RDW 12.8 04/01/2014 2234   RDW 12.7  01/02/2014 1152   LYMPHSABS 3.5 04/01/2014 2234   LYMPHSABS 2.4 01/02/2014 1152   MONOABS 1.0 04/01/2014 2234   MONOABS 0.7 01/02/2014 1152   EOSABS 0.4 04/01/2014 2234   EOSABS 0.4 01/02/2014 1152   BASOSABS 0.0 04/01/2014 2234   BASOSABS 0.1 01/02/2014 1152    Hgb A1C No results found for: HGBA1C       Assessment & Plan:   Abdominal cramping, diarrhea, and fatigue;  Hemoccult positive Will check CBC, CMET, amylase, lipase, and TIBC today Start  taking a probiotic daily May need referral to GI pending lab results  RTC as needed or if symptoms persist or worsen, will follow up with you after your labs are back.

## 2015-03-12 NOTE — Patient Instructions (Addendum)
We are checking labs today You may need a referral to GI, but I will let you know after the labs are back Start taking a Probiotic daily such as Align or [facility]  Diarrhea Diarrhea is frequent loose and watery bowel movements. It can cause you to feel weak and dehydrated. Dehydration can cause you to become tired and thirsty, have a dry mouth, and have decreased urination that often is dark yellow. Diarrhea is a sign of another problem, most often an infection that will not last long. In most cases, diarrhea typically lasts 2-3 days. However, it can last longer if it is a sign of something more serious. It is important to treat your diarrhea as directed by your caregiver to lessen or prevent future episodes of diarrhea. CAUSES  Some common causes include:  Gastrointestinal infections caused by viruses, bacteria, or parasites.  Food poisoning or food allergies.  Certain medicines, such as antibiotics, chemotherapy, and laxatives.  Artificial sweeteners and fructose.  Digestive disorders. HOME CARE INSTRUCTIONS  Ensure adequate fluid intake (hydration): Have 1 cup (8 oz) of fluid for each diarrhea episode. Avoid fluids that contain simple sugars or sports drinks, fruit juices, whole milk products, and sodas. Your urine should be clear or pale yellow if you are drinking enough fluids. Hydrate with an oral rehydration solution that you can purchase at pharmacies, retail stores, and online. You can prepare an oral rehydration solution at home by mixing the following ingredients together:   - tsp table salt.   tsp baking soda.   tsp salt substitute containing potassium chloride.  1  tablespoons sugar.  1 L (34 oz) of water.  Certain foods and beverages may increase the speed at which food moves through the gastrointestinal (GI) tract. These foods and beverages should be avoided and include:  Caffeinated and alcoholic beverages.  High-fiber foods, such as raw fruits and  vegetables, nuts, seeds, and whole grain breads and cereals.  Foods and beverages sweetened with sugar alcohols, such as xylitol, sorbitol, and mannitol.  Some foods may be well tolerated and may help thicken stool including:  Starchy foods, such as rice, toast, pasta, low-sugar cereal, oatmeal, grits, baked potatoes, crackers, and bagels.  Bananas.  Applesauce.  Add probiotic-rich foods to help increase healthy bacteria in the GI tract, such as yogurt and fermented milk products.  Wash your hands well after each diarrhea episode.  Only take over-the-counter or prescription medicines as directed by your caregiver.  Take a warm bath to relieve any burning or pain from frequent diarrhea episodes. SEEK IMMEDIATE MEDICAL CARE IF:   You are unable to keep fluids down.  You have persistent vomiting.  You have blood in your stool, or your stools are black and tarry.  You do not urinate in 6-8 hours, or there is only a small amount of very dark urine.  You have abdominal pain that increases or localizes.  You have weakness, dizziness, confusion, or light-headedness.  You have a severe headache.  Your diarrhea gets worse or does not get better.  You have a fever or persistent symptoms for more than 2-3 days.  You have a fever and your symptoms suddenly get worse. MAKE SURE YOU:   Understand these instructions.  Will watch your condition.  Will get help right away if you are not doing well or get worse. Document Released: 08/20/2002 Document Revised: 01/14/2014 Document Reviewed: 05/07/2012 [facility] Patient Information 2015 [place] Center[place]Care, [place]LL[place]. This information is not intended to replace advice given to  you by your health care provider. Make sure you discuss any questions you have with your health care provider.  

## 2015-03-12 NOTE — Progress Notes (Signed)
Pre visit review using our clinic review tool, if applicable. No additional management support is needed unless otherwise documented below in the visit note. 

## 2015-03-13 ENCOUNTER — Encounter: Payer: Self-pay | Admitting: *Deleted

## 2015-03-13 ENCOUNTER — Other Ambulatory Visit: Payer: Self-pay | Admitting: Internal Medicine

## 2015-03-13 DIAGNOSIS — R1011 Right upper quadrant pain: Secondary | ICD-10-CM

## 2015-03-13 DIAGNOSIS — R197 Diarrhea, unspecified: Secondary | ICD-10-CM

## 2015-03-14 ENCOUNTER — Encounter: Payer: Self-pay | Admitting: Nurse Practitioner

## 2015-03-31 ENCOUNTER — Ambulatory Visit (INDEPENDENT_AMBULATORY_CARE_PROVIDER_SITE_OTHER): Payer: 59 | Admitting: Nurse Practitioner

## 2015-03-31 ENCOUNTER — Ambulatory Visit
Admission: RE | Admit: 2015-03-31 | Discharge: 2015-03-31 | Disposition: A | Discharge status: Home / Self Care | Payer: 59 | Source: Ambulatory Visit | Attending: Nurse Practitioner | Admitting: Nurse Practitioner

## 2015-03-31 ENCOUNTER — Encounter: Payer: Self-pay | Admitting: Nurse Practitioner

## 2015-03-31 VITALS — BP 104/76 | HR 80 | Ht 65.0 in | Wt 254.4 lb

## 2015-03-31 DIAGNOSIS — R195 Other fecal abnormalities: Secondary | ICD-10-CM

## 2015-03-31 DIAGNOSIS — R1011 Right upper quadrant pain: Secondary | ICD-10-CM

## 2015-03-31 DIAGNOSIS — R194 Change in bowel habit: Secondary | ICD-10-CM | POA: Diagnosis not present

## 2015-03-31 MED ORDER — OMEPRAZOLE 40 MG PO CPDR: 40.0000 mg | DELAYED_RELEASE_CAPSULE | Freq: Every day | ORAL | Status: DC

## 2015-03-31 NOTE — Patient Instructions (Addendum)
We sent a prescription to [facility] [place], [place]. Take no Mobic or NSAIDS for 4-6 weeks.  You have been scheduled for an abdominal ultrasound at [facility] [address] today Monday 03-31-2015 at 2:00 . Please arrive at 1:40 PM  prior to your appointment for registration. Make certain not to have anything to eat or drink 6 hours prior to your appointment. Should you need to reschedule your appointment, please contact GI a5 [phone] if you need to reschedule. This test typically takes about 30 minutes to perform.      Normal BMI (Body Mass Index- based on height and weight) is between 19 and 25. Your BMI today is Body mass index is 42.33 kg/(m^2). Marland Kitchen. Please consider follow up  regarding your BMI with your Primary Care Provider.

## 2015-03-31 NOTE — Progress Notes (Addendum)
HPI :   Patient is a 34 year old female referred by PCP. Over the last 4 months patient has had intermittent loose stool associated with urgency. The urgency is frequently postprandial and isn't always associated with loose stools, she has normal bowel movements as well. No dietary or medication changes. Patient describes intermittent black stools and generalized crampy abdominal pain (predominantly right upper quadrant) over the last few months as well. She takes Mobic a couple of times a week for knee pain. No other NSAID use.  Patient does not take iron or use bismuth products. She was Hemoccult-negative at PCPs office recently. No history of peptic ulcer disease. She has no nausea. No unexpected weight loss. Recent CBC reveals normal hemoglobin and MCV.  Past Medical History  Diagnosis Date  . Migraines   . Seasonal allergies   . 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 Arthritis Mother   . Ovarian cancer Mother   . Hyperlipidemia Mother   . Hypertension Mother   . Arthritis Father   . Hypertension Father   . Asthma Father   . Asthma Sister   . Arthritis Paternal Grandmother   . Irritable bowel syndrome Mother    History  Substance Use Topics  . Smoking status: Never Smoker   . Smokeless tobacco: Never Used  . Alcohol Use: 0.0 oz/week    0 Standard drinks or equivalent per week     Comment: occasionally   Current Outpatient Prescriptions  Medication Sig Dispense Refill  . fexofenadine (ALLEGRA) 180 MG tablet Take 1 tablet (180 mg total) by mouth 2 (two) times daily. (Patient taking differently: Take 360 mg by mouth daily. ) 60 tablet 4  . fluticasone (FLONASE) 50 MCG/ACT nasal spray Place 2 sprays into both nostrils daily. 16 g 1  . levonorgestrel-ethinyl estradiol  (SEASONALE,INTROVALE,JOLESSA) 0.15-0.03 MG tablet Take 1 tablet by mouth daily.    . meloxicam (MOBIC) 15 MG tablet Take 15 mg by mouth daily.   3  . montelukast (SINGULAIR) 10 MG tablet TAKE 1 TABLET BY MOUTH AT BEDTIME 30 tablet 3  . ondansetron (ZOFRAN) 4 MG tablet Take 1 tablet (4 mg total) by mouth every 8 (eight) hours as needed for nausea or vomiting. 40 tablet 0  . oxyCODONE-acetaminophen (ROXICET) 5-325 MG per tablet Take 1-2 tablets by mouth every 4 (four) hours as needed. 60 tablet 0  . SUMAtriptan (IMITREX) 25 MG tablet Take 1 tablet (25 mg total) by mouth every 2 (two) hours as needed for migraine. 10 tablet 0  . traMADol (ULTRAM) 50 MG tablet Take 50 mg by mouth every 8 (eight) hours as needed.  0  . omeprazole (PRILOSEC) 40 MG capsule Take 1 capsule (40 mg total) by mouth daily. 90 capsule 3   No current facility-administered medications for this visit.   Allergies  Allergen Reactions  . Penicillins Shortness Of Breath  . Shellfish Allergy Shortness Of Breath  . Soap Itching    ANYTHING WITH FRAGRANCE     Review of Systems: Positive for allergy/sinus trouble, fatigue, headaches and sleeping problems. All other systems reviewed and negative except where noted in HPI.   Physical Exam: BP 104/76 mmHg  Pulse 80  Ht 5\' 5"  (1.651 m)  Wt 254 lb 6 oz (115.384 kg)  BMI 42.33 kg/m2  Constitutional: Pleasant, obese white female in no acute distress. HEENT: Normocephalic and atraumatic. Conjunctivae are normal. No scleral icterus. Neck supple.  Cardiovascular: Normal rate, regular rhythm.  Pulmonary/chest: Effort normal and breath sounds normal. No wheezing, rales or rhonchi. Abdominal: Soft, nondistended, nontender. Bowel sounds active throughout. There are no masses palpable. No hepatomegaly. Extremities: no edema Lymphadenopathy: No cervical adenopathy noted. Neurological: Alert and oriented to person place and time. Skin: Skin is warm and dry. No rashes  noted. Psychiatric: Normal mood and affect. Behavior is normal.   ASSESSMENT AND PLAN:   34 year old female referred for several month history of abdominal pain (predominantly RUQ), intermittent loose stools, and intermittent black stools (heme positive). She does take Meloxicam and could have PUD though no evidence for significant GI bleeding with normal CBC. Intermittent loose stools could be IBS, especially since she has normal BMs as well.   Asked patient to hold NSAIDs / cox 2 inhibitor for next 4-6 weeks.   Start Omeprazole  30 minutes before breakfast.   We discussed EGD / colonoscopy but she doesn't want to pursue invasive workup yet and I certainly think that is reasonable as exam and labs are reassuring.   Return for follow up in 4-6 weeks.     Cc: Nicki Reaper, NP  Addendum: Reviewed and agree with initial management. Beverley Fiedler, MD

## 2015-04-01 ENCOUNTER — Emergency Department (HOSPITAL_COMMUNITY): Payer: 59

## 2015-04-01 ENCOUNTER — Encounter (HOSPITAL_COMMUNITY): Payer: Self-pay | Admitting: Emergency Medicine

## 2015-04-01 ENCOUNTER — Telehealth: Payer: Self-pay | Admitting: Nurse Practitioner

## 2015-04-01 DIAGNOSIS — T471X5A Adverse effect of other antacids and anti-gastric-secretion drugs, initial encounter: Secondary | ICD-10-CM | POA: Insufficient documentation

## 2015-04-01 DIAGNOSIS — Y999 Unspecified external cause status: Secondary | ICD-10-CM | POA: Diagnosis not present

## 2015-04-01 DIAGNOSIS — R5383 Other fatigue: Secondary | ICD-10-CM | POA: Insufficient documentation

## 2015-04-01 DIAGNOSIS — Y929 Unspecified place or not applicable: Secondary | ICD-10-CM | POA: Diagnosis not present

## 2015-04-01 DIAGNOSIS — R6883 Chills (without fever): Secondary | ICD-10-CM | POA: Diagnosis not present

## 2015-04-01 DIAGNOSIS — R0789 Other chest pain: Secondary | ICD-10-CM | POA: Insufficient documentation

## 2015-04-01 DIAGNOSIS — J029 Acute pharyngitis, unspecified: Secondary | ICD-10-CM | POA: Insufficient documentation

## 2015-04-01 DIAGNOSIS — R51 Headache: Secondary | ICD-10-CM | POA: Insufficient documentation

## 2015-04-01 DIAGNOSIS — R079 Chest pain, unspecified: Secondary | ICD-10-CM | POA: Diagnosis present

## 2015-04-01 DIAGNOSIS — Y939 Activity, unspecified: Secondary | ICD-10-CM | POA: Diagnosis not present

## 2015-04-01 DIAGNOSIS — X58XXXA Exposure to other specified factors, initial encounter: Secondary | ICD-10-CM | POA: Insufficient documentation

## 2015-04-01 LAB — CBC
HCT: 35.4 % — ABNORMAL LOW (ref 36.0–46.0)
HEMOGLOBIN: 11.8 g/dL — AB (ref 12.0–15.0)
MCH: 29.6 pg (ref 26.0–34.0)
MCHC: 33.3 g/dL (ref 30.0–36.0)
MCV: 88.7 fL (ref 78.0–100.0)
PLATELETS: 245 10*3/uL (ref 150–400)
RBC: 3.99 MIL/uL (ref 3.87–5.11)
RDW: 13 % (ref 11.5–15.5)
WBC: 11.9 10*3/uL — ABNORMAL HIGH (ref 4.0–10.5)

## 2015-04-01 LAB — BASIC METABOLIC PANEL
ANION GAP: 8 (ref 5–15)
BUN: <5 mg/dL — ABNORMAL LOW (ref 6–20)
CO2: 24 mmol/L (ref 22–32)
Calcium: 9 mg/dL (ref 8.9–10.3)
Chloride: 104 mmol/L (ref 101–111)
Creatinine, Ser: 0.82 mg/dL (ref 0.44–1.00)
GLUCOSE: 116 mg/dL — AB (ref 65–99)
POTASSIUM: 3.2 mmol/L — AB (ref 3.5–5.1)
SODIUM: 136 mmol/L (ref 135–145)

## 2015-04-01 LAB — I-STAT TROPONIN, ED: TROPONIN I, POC: 0.06 ng/mL (ref 0.00–0.08)

## 2015-04-01 NOTE — ED Notes (Signed)
Pt. presents with multiple complaints : chest tightness , sore throat , headache , chills , and fatigue after taking Omeprazole yesterday . Airway intact / respirations unlabored . Denies fever or chills.

## 2015-04-01 NOTE — Telephone Encounter (Signed)
Patient reports that she took omeprazole after the ultrasound yesterday.  Reports she developed difficulty breathing and "throat swelling".  She has been taking benadryl and her rescue inhaler.  She is advised to continue benadryl q 6 hours until her symptoms have completely resolved.  She is advised that if she develops worsening symptoms she will need to go to the ER.

## 2015-04-02 ENCOUNTER — Emergency Department (HOSPITAL_COMMUNITY)
Admission: EM | Admit: 2015-04-02 | Discharge: 2015-04-02 | Discharge status: Against Medical Advice | Payer: 59 | Attending: Emergency Medicine | Admitting: Emergency Medicine

## 2015-04-02 ENCOUNTER — Telehealth: Payer: Self-pay

## 2015-04-02 ENCOUNTER — Ambulatory Visit (INDEPENDENT_AMBULATORY_CARE_PROVIDER_SITE_OTHER): Payer: 59 | Admitting: Internal Medicine

## 2015-04-02 ENCOUNTER — Encounter: Payer: Self-pay | Admitting: Internal Medicine

## 2015-04-02 VITALS — BP 120/80 | HR 75 | Temp 97.8°F | Wt 251.0 lb

## 2015-04-02 DIAGNOSIS — T783XXA Angioneurotic edema, initial encounter: Secondary | ICD-10-CM | POA: Diagnosis not present

## 2015-04-02 DIAGNOSIS — R1011 Right upper quadrant pain: Secondary | ICD-10-CM | POA: Diagnosis not present

## 2015-04-02 MED ORDER — PREDNISONE 20 MG PO TABS: 40.0000 mg | ORAL_TABLET | Freq: Every day | ORAL | Status: DC

## 2015-04-02 NOTE — Telephone Encounter (Signed)
Pt has appt with Dr Alphonsus SiasLetvak on 04/02/15 at 11:15 AM.

## 2015-04-02 NOTE — Progress Notes (Signed)
Pre visit review using our clinic review tool, if applicable. No additional management support is needed unless otherwise documented below in the visit note. 

## 2015-04-02 NOTE — ED Notes (Addendum)
Pt has decided to leave AMA.  Advised pt she only had four people in front of her and she still stated she was leaving.

## 2015-04-02 NOTE — Assessment & Plan Note (Signed)
Seems to have had a variant of angioedema--not anaphylaxis Still symptomatic and anxious Will give a few days of prednisone to hasten recovery Reassured that lungs are fine

## 2015-04-02 NOTE — Progress Notes (Signed)
Subjective:    Patient ID: Mikayla Briggs, female    DOB: 02/09/1981, 34 y.o.   MRN: [id number]  HPI  Here due to apparent allergic reaction  Started omeprazole and noticed soreness in throat with the first dose Felt her throat swelling and chest tight Started benedryl and rescue inhaler Couldn't sleep that night  Called in yesterday to GI--never heard back Told to stay off the medication Was able to go to work yesterday --but felt like crap (headache and body aches) Still having ear pain, head pressure, throat pain, odynophagia, nasal congestion  Called here---to ER Got x-ray, EKG and blood work Never seen after hours--so left  Head still hurting imitrex helping some Still with throat and ear pain Some swelling in periorbital area  Able to eat and drink but it hurts Not eating much  Current Outpatient Prescriptions on File Prior to Visit  Medication Sig Dispense Refill  . fexofenadine (ALLEGRA) 180 MG tablet Take 1 tablet (180 mg total) by mouth 2 (two) times daily. (Patient taking differently: Take 360 mg by mouth daily. ) 60 tablet 4  . fluticasone (FLONASE) 50 MCG/ACT nasal spray Place 2 sprays into both nostrils daily. 16 g 1  . levonorgestrel-ethinyl estradiol (SEASONALE,INTROVALE,JOLESSA) 0.15-0.03 MG tablet Take 1 tablet by mouth daily.    . meloxicam (MOBIC) 15 MG tablet Take 15 mg by mouth daily.   3  . montelukast (SINGULAIR) 10 MG tablet TAKE 1 TABLET BY MOUTH AT BEDTIME 30 tablet 3  . ondansetron (ZOFRAN) 4 MG tablet Take 1 tablet (4 mg total) by mouth every 8 (eight) hours as needed for nausea or vomiting. 40 tablet 0  . oxyCODONE-acetaminophen (ROXICET) 5-325 MG per tablet Take 1-2 tablets by mouth every 4 (four) hours as needed. 60 tablet 0  . SUMAtriptan (IMITREX) 25 MG tablet Take 1 tablet (25 mg total) by mouth every 2 (two) hours as needed for migraine. 10 tablet 0  . traMADol (ULTRAM) 50 MG tablet Take 50 mg by mouth every 8 (eight) hours as needed.  0    No current facility-administered medications on file prior to visit.    Allergies  Allergen Reactions  . Omeprazole Shortness Of Breath  . Penicillins Shortness Of Breath  . Shellfish Allergy Shortness Of Breath  . Soap Itching    ANYTHING WITH FRAGRANCE    Past Medical History  Diagnosis Date  . Migraines   . Seasonal allergies   . 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 PUD (peptic ulcer disease)     Past Surgical History  Procedure Laterality Date  . Wisdom tooth extraction    . Tonsillectomy and adenoidectomy    . Cholecystectomy  11/2003  . Knee arthroscopy Left 2000  . Knee arthroscopy with medial menisectomy Right 10/10/2014    Procedure: RIGHT KNEE ARTHROSCOPY; RESECTION PLICA; CHONDROPLASTY, AND PARTIAL MEDIAL MENISCECTOMY;  Surgeon: Loreta Ave, MD;  Location: [facility];  Service: Orthopedics;  Laterality: Right;    Family History  Problem Relation Age of Onset  . Arthritis Mother   . Ovarian cancer Mother   . Hyperlipidemia Mother   . Hypertension Mother   . Arthritis Father   . Hypertension Father   . Asthma Father   . Asthma Sister   . Arthritis Paternal Grandmother   . Irritable bowel syndrome Mother  History   Social History  . Marital Status: Divorced    Spouse Name: N/A  . Number of Children: 0  . Years of Education: N/A   Occupational History  . CNA/ CPT    Social History Main Topics  . Smoking status: Never Smoker   . Smokeless tobacco: Never Used  . Alcohol Use: 0.0 oz/week    0 Standard drinks or equivalent per week     Comment: occasionally  . Drug Use: No  . Sexual Activity: Yes   Other Topics Concern  . Not on file   Social History Narrative     Review of Systems Was able to sleep last night No abdominal pain Temp was 101.3 last night--after coming home from the  ER    Objective:   Physical Exam  Constitutional:  Uncomfortable but no distress  HENT:  Face is puffy --but may be her baseline Throat and mouth have no lesions  Neck: Normal range of motion. Neck supple.  Mildly tender cervical nodes  Cardiovascular: Normal rate, regular rhythm and normal heart sounds.  Exam reveals no gallop.   No murmur heard. Pulmonary/Chest: Effort normal and breath sounds normal. No respiratory distress. She has no wheezes. She has no rales.  Musculoskeletal: She exhibits no edema.  Psychiatric:  Anxious and upset          Assessment & Plan:

## 2015-04-02 NOTE — Telephone Encounter (Signed)
Please make sure they can wait till the appointment--sounded like a potential emergency that needed immediate attention

## 2015-04-02 NOTE — Telephone Encounter (Signed)
Pt went to [facility] ED last night;had labs,EKG and CXR but after 5 hours had not seen a physician and pt left ED. Pt wants to keep appt today with Dr Alphonsus SiasLetvak.

## 2015-04-02 NOTE — Assessment & Plan Note (Signed)
Better now off the mobic Will leave to R Baity about further action---she is not happy with GI at this point due to their delays about getting back to her

## 2015-04-02 NOTE — Telephone Encounter (Signed)
PLEASE NOTE: All timestamps contained within this report are represented as [place] Standard Time. CONFIDENTIALTY NOTICE: This fax transmission is intended only for the addressee. It contains information that is legally privileged, confidential or otherwise protected from use or disclosure. If you are not the intended recipient, you are strictly prohibited from reviewing, disclosing, copying using or disseminating any of this information or taking any action in reliance on or regarding this information. If you have received this fax in error, please notify us immediately by telephone so that we can arrange for its return to us. Phone: [phone], Toll-Free: [phone], Fax: [phone] Page: 1 of 2 Call Id: [id number] [facility] Night - Client TELEPHONE ADVICE RECORD [facility] Patient Name: Mikayla PiggLAUREN Briggs Gender: Female DOB: 02-12-1981 Age: [age] Y 9 M 21 D Return Phone Number: [phone] (Primary), [phone] (Secondary) Address: [address] City/State/Zip: [place] [id number] Client [facility] Night - Client Client Site [facility] - Night Physician Nicki ReaperBaity, Regina Contact Type Call Call Type Triage / Clinical Caller Name Lonnie Relationship To Patient Partner Return Phone Number [phone] (Primary) Chief Complaint Chest Pain (non urgent symptoms) Initial Comment Caller States his fianc was given some medication from the GI but she had an allergic reaction, now she is a lot of pain, says she is having chest pain still from reaction, sore throat PreDisposition InappropriateToAsk Nurse Assessment Nurse: Sadie HaberBrewner, RN, Hettie Date/Time (Eastern Time): 04/01/2015 8:32:52 PM Confirm and document reason for call. If symptomatic, describe symptoms. ---Caller States his fianc was given some medication from the GI but she had an allergic reaction, now she is a lot of pain, says she is  having chest pain still from reaction, sore throat. Took one dose yest at 1400. Anaphylactic rxn. Took Benadryl and that helped somewhat. Temp 101.3. Med was omeprazole. Has an ulcer. Has the patient traveled out of the country within the last 30 days? ---No Does the patient require triage? ---Yes Related visit to physician within the last 2 weeks? ---Yes Does the PT have any chronic conditions? (i.e. diabetes, asthma, etc.) ---Yes List chronic conditions. ---Allergies Did the patient indicate they were pregnant? ---No Guidelines Guideline Title Affirmed Question Affirmed Notes Nurse Date/Time (Eastern Time) Chest Pain [1] Chest pain lasts > 5 minutes AND [2] described as crushing, pressure-like, or heavy Brewner, RN, Hettie 04/01/2015 8:36:02 PM Disp. Time Lamount Cohen(Eastern Time) Disposition Final User 04/01/2015 8:39:58 PM 911 Outcome Documentation Brewner, RN, Hettie Reason: Caller and pt state they can get to the ED faster than an ambulance and they will just PLEASE NOTE: All timestamps contained within this report are represented as [place] Standard Time. CONFIDENTIALTY NOTICE: This fax transmission is intended only for the addressee. It contains information that is legally privileged, confidential or otherwise protected from use or disclosure. If you are not the intended recipient, you are strictly prohibited from reviewing, disclosing, copying using or disseminating any of this information or taking any action in reliance on or regarding this information. If you have received this fax in error, please notify us immediately by telephone so that we can arrange for its return to us. Phone: [phone], Toll-Free: [phone], Fax: [phone] Page: 2 of 2 Call Id: [id number] Disp. Time (Eastern Time) Disposition Final User drive to the hosp, despite this RNs repeated recommendation. 04/01/2015 8:39:11 PM Call EMS 911 Now Yes Brewner, RN, Hettie Caller Understands: Yes Disagree/Comply:  Disagree Disagree/Comply Reason: Disagree with instructions Care Advice Given Per Guideline CALL  EMS 911 NOW: Immediate medical attention is needed. You need to hang up and call 911 (or an ambulance). Probation officer Discretion: I'll call you back in a few minutes to be sure you were able to reach them.) CARE ADVICE given per Chest Pain (Adult) guideline. After Care Instructions Given Call Event Type User Date / Time Description

## 2015-04-02 NOTE — Telephone Encounter (Signed)
Okay I thought the call was from today

## 2015-04-11 NOTE — Telephone Encounter (Signed)
Sheri, can you follow up with patient and see how she is doing? I know she couldn't take the omeprazole. Is she still off mobic? I wouldn't want to try another PPI given response to the omeprazole so if still having upper abdominal discomfort then we may need to proceed with the EGD.  Thanks

## 2015-04-11 NOTE — Telephone Encounter (Signed)
Patient cancelled all of her follow up.

## 2015-05-12 ENCOUNTER — Encounter (HOSPITAL_COMMUNITY): Payer: Self-pay | Admitting: Emergency Medicine

## 2015-05-12 ENCOUNTER — Emergency Department (INDEPENDENT_AMBULATORY_CARE_PROVIDER_SITE_OTHER)
Admission: EM | Admit: 2015-05-12 | Discharge: 2015-05-12 | Disposition: A | Discharge status: Home / Self Care | Payer: 59 | Source: Home / Self Care | Attending: Family Medicine | Admitting: Family Medicine

## 2015-05-12 DIAGNOSIS — G43009 Migraine without aura, not intractable, without status migrainosus: Secondary | ICD-10-CM

## 2015-05-12 MED ORDER — ONDANSETRON HCL 4 MG/2ML IJ SOLN
INTRAMUSCULAR | Status: AC
  Filled 2015-05-12: qty 2

## 2015-05-12 MED ORDER — ONDANSETRON HCL 4 MG/2ML IJ SOLN
4.0000 mg | Freq: Once | INTRAMUSCULAR | Status: AC
  Administered 2015-05-12: 4 mg via INTRAMUSCULAR

## 2015-05-12 MED ORDER — KETOROLAC TROMETHAMINE 60 MG/2ML IM SOLN
INTRAMUSCULAR | Status: AC
  Filled 2015-05-12: qty 2

## 2015-05-12 MED ORDER — KETOROLAC TROMETHAMINE 60 MG/2ML IM SOLN
60.0000 mg | Freq: Once | INTRAMUSCULAR | Status: AC
  Administered 2015-05-12: 60 mg via INTRAMUSCULAR

## 2015-05-12 NOTE — ED Provider Notes (Signed)
CSN: [id number]     Arrival date & time 05/12/15  1906 History   First MD Initiated Contact with Patient 05/12/15 1945     Chief Complaint  Patient presents with  . Migraine   (Consider location/radiation/quality/duration/timing/severity/associated sxs/prior Treatment) Patient is a 34 y.o. female presenting with migraines. The history is provided by the patient and the spouse.  Migraine This is a chronic problem. The current episode started 3 to 5 hours ago. The problem has been gradually worsening. Associated symptoms include headaches. Pertinent negatives include no chest pain and no abdominal pain. Associated symptoms comments: Vomiting x4 since 4;30. Nothing relieves the symptoms. Treatments tried: used imitrex and zofran without relief.    Past Medical History  Diagnosis Date  . Migraines   . Seasonal allergies   . 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 PUD (peptic ulcer disease)    Past Surgical History  Procedure Laterality Date  . Wisdom tooth extraction    . Tonsillectomy and adenoidectomy    . Cholecystectomy  11/2003  . Knee arthroscopy Left 2000  . Knee arthroscopy with medial menisectomy Right 10/10/2014    Procedure: RIGHT KNEE ARTHROSCOPY; RESECTION PLICA; CHONDROPLASTY, AND PARTIAL MEDIAL MENISCECTOMY;  Surgeon: Loreta Ave, MD;  Location: [facility];  Service: Orthopedics;  Laterality: Right;   Family History  Problem Relation Age of Onset  . Arthritis Mother   . Ovarian cancer Mother   . Hyperlipidemia Mother   . Hypertension Mother   . Arthritis Father   . Hypertension Father   . Asthma Father   . Asthma Sister   . Arthritis Paternal Grandmother   . Irritable bowel syndrome Mother    Social History  Substance Use Topics  . Smoking status: Never Smoker   . Smokeless tobacco: Never Used  . Alcohol Use:  0.0 oz/week    0 Standard drinks or equivalent per week     Comment: occasionally   OB History    No data available     Review of Systems  Constitutional: Negative for fever.  Cardiovascular: Negative for chest pain.  Gastrointestinal: Positive for nausea and vomiting. Negative for abdominal pain and diarrhea.  Genitourinary: Negative for dysuria and menstrual problem.  Neurological: Positive for headaches. Negative for dizziness, seizures and speech difficulty.    Allergies  Omeprazole; Penicillins; Shellfish allergy; and Soap  Home Medications   Prior to Admission medications   Medication Sig Start Date End Date Taking? Authorizing Provider  fexofenadine (ALLEGRA) 180 MG tablet Take 1 tablet (180 mg total) by mouth 2 (two) times daily. Patient taking differently: Take 360 mg by mouth daily.  09/25/14   Lorre Munroe, NP  fluticasone (FLONASE) 50 MCG/ACT nasal spray Place 2 sprays into both nostrils daily. 01/16/15   Doreene Nest, NP  levonorgestrel-ethinyl estradiol (SEASONALE,INTROVALE,JOLESSA) 0.15-0.03 MG tablet Take 1 tablet by mouth daily.    Historical Provider, MD  meloxicam (MOBIC) 15 MG tablet Take 15 mg by mouth daily.  10/18/14   Historical Provider, MD  montelukast (SINGULAIR) 10 MG tablet TAKE 1 TABLET BY MOUTH AT BEDTIME 03/12/15   Lorre Munroe, NP  ondansetron (ZOFRAN) 4 MG tablet Take 1 tablet (4 mg total) by mouth every 8 (eight) hours as needed for nausea or vomiting. 10/10/14   Cristie Hem, PA-C  oxyCODONE-acetaminophen (ROXICET) 5-325 MG per  tablet Take 1-2 tablets by mouth every 4 (four) hours as needed. 10/10/14   Cristie Hem, PA-C  predniSONE (DELTASONE) 20 MG tablet Take 2 tablets (40 mg total) by mouth daily. 04/02/15   Karie Schwalbe, MD  SUMAtriptan (IMITREX) 25 MG tablet Take 1 tablet (25 mg total) by mouth every 2 (two) hours as needed for migraine. 01/20/15   Lorre Munroe, NP  traMADol (ULTRAM) 50 MG tablet Take 50 mg by mouth every 8 (eight)  hours as needed. 08/29/14   Historical Provider, MD   Meds Ordered and Administered this Visit   Medications  ketorolac (TORADOL) injection 60 mg (not administered)  ondansetron (ZOFRAN) injection 4 mg (not administered)    BP 124/80 mmHg  Temp(Src) 97.2 F (36.2 C) (Oral)  Resp 16  SpO2 100% No data found.   Physical Exam  Constitutional: She is oriented to person, place, and time. She appears well-developed and well-nourished. She appears distressed.  HENT:  Head: Normocephalic.  Right Ear: External ear normal.  Left Ear: External ear normal.  Eyes: Conjunctivae and EOM are normal. Pupils are equal, round, and reactive to light.  Neck: Normal range of motion. Neck supple.  Cardiovascular: Normal heart sounds.   Abdominal: There is no tenderness.  Lymphadenopathy:    She has no cervical adenopathy.  Neurological: She is alert and oriented to person, place, and time. No cranial nerve deficit.  Skin: Skin is warm and dry.  Nursing note and vitals reviewed.   ED Course  Procedures (including critical care time)  Labs Review Labs Reviewed - No data to display  Imaging Review No results found.   Visual Acuity Review  Right Eye Distance:   Left Eye Distance:   Bilateral Distance:    Right Eye Near:   Left Eye Near:    Bilateral Near:         MDM   1. Nonintractable migraine, unspecified migraine type        Linna Hoff, MD 05/12/15 [phone]

## 2015-05-12 NOTE — Discharge Instructions (Signed)
Home to rest, drink plenty of fluids, see your doctor if further problems.

## 2015-05-12 NOTE — ED Notes (Signed)
C/o headache, head started hurting today.  Vomiting this afternoon, started at 4:30pm.  C/o light sensitivity, pain in back of head.  Patient requesting torodol

## 2015-05-30 ENCOUNTER — Ambulatory Visit: Payer: Self-pay | Admitting: Internal Medicine

## 2015-06-19 ENCOUNTER — Telehealth: Payer: Self-pay | Admitting: Internal Medicine

## 2015-06-19 NOTE — Telephone Encounter (Signed)
[facility] - Client TELEPHONE ADVICE RECORD [facility] Patient Name: Mikayla Briggs DOB: [birth year] Initial Comment Caller states she received a tetanus shot. It is very red and hot to the touch. Nurse Assessment Nurse: Ladona Ridgel RN, Felicia Date/Time (Eastern Time): 06/19/2015 4:43:54 PM Confirm and document reason for call. If symptomatic, describe symptoms. ---Monday pt went to [facility] occupation and wellness bc she had a needlestick and they did blood work and they gave her a tetanus shot. It is not as sore as it was Mon and Tues but still sore and there is a knot the size of a ball point pen and redness is size of 1 1/2" size Has the patient traveled out of the country within the last 30 days? ---No Does the patient have any new or worsening symptoms? ---Yes Will a triage be completed? ---Yes Related visit to physician within the last 2 weeks? ---Yes Does the PT have any chronic conditions? (i.e. diabetes, asthma, etc.) ---YesList chronic conditions. ---allergies Did the patient indicate they were pregnant? ---No Guidelines Guideline Title Affirmed Question Affirmed Notes Immunization Reactions [1] Redness or red streak around the injection site AND [2] begins > 48 hours after shot AND [3] no fever (Exception: red area < 1 inch or 2.5 cm wide) Final Disposition User See Physician within 24 Hours Gaddy, RN, [facility] Referrals REFERRED TO PCP OFFICE Disagree/Comply: Comply  Call Id: [id number]

## 2015-06-20 ENCOUNTER — Ambulatory Visit (INDEPENDENT_AMBULATORY_CARE_PROVIDER_SITE_OTHER): Payer: 59 | Admitting: Internal Medicine

## 2015-06-20 ENCOUNTER — Encounter: Payer: Self-pay | Admitting: Internal Medicine

## 2015-06-20 VITALS — BP 126/84 | HR 77 | Temp 97.9°F | Wt 239.0 lb

## 2015-06-20 DIAGNOSIS — T8090XA Unspecified complication following infusion and therapeutic injection, initial encounter: Secondary | ICD-10-CM | POA: Diagnosis not present

## 2015-06-20 NOTE — Progress Notes (Signed)
Pre visit review using our clinic review tool, if applicable. No additional management support is needed unless otherwise documented below in the visit note. 

## 2015-06-20 NOTE — Telephone Encounter (Signed)
Pt has appt 06/20/15 at 4:15 with Nicki Reaper NP.

## 2015-06-20 NOTE — Progress Notes (Signed)
Subjective:    Patient ID: Mikayla Briggs, female    DOB: [birth year], 34 y.o.   MRN: [id number]  HPI  Pt presents to the clinic today with c/o left arm pain and swelling. This started 4 days ago after she received a Tdap injection. The area is painful. She feels a knot in the area. She has not noticed any warmth or drainage in the area. She did try putting a cold compress on it without much relief.  Review of Systems      Past Medical History  Diagnosis Date  . Migraines   . Seasonal allergies   . 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 PUD (peptic ulcer disease)     Current Outpatient Prescriptions  Medication Sig Dispense Refill  . fexofenadine (ALLEGRA) 180 MG tablet Take 1 tablet (180 mg total) by mouth 2 (two) times daily. (Patient taking differently: Take 360 mg by mouth daily. ) 60 tablet 4  . fluticasone (FLONASE) 50 MCG/ACT nasal spray Place 2 sprays into both nostrils daily. 16 g 1  . levonorgestrel-ethinyl estradiol (SEASONALE,INTROVALE,JOLESSA) 0.15-0.03 MG tablet Take 1 tablet by mouth daily.    . meloxicam (MOBIC) 15 MG tablet Take 15 mg by mouth daily.   3  . montelukast (SINGULAIR) 10 MG tablet TAKE 1 TABLET BY MOUTH AT BEDTIME 30 tablet 3  . ondansetron (ZOFRAN) 4 MG tablet Take 1 tablet (4 mg total) by mouth every 8 (eight) hours as needed for nausea or vomiting. 40 tablet 0  . oxyCODONE-acetaminophen (ROXICET) 5-325 MG per tablet Take 1-2 tablets by mouth every 4 (four) hours as needed. 60 tablet 0  . predniSONE (DELTASONE) 20 MG tablet Take 2 tablets (40 mg total) by mouth daily. 10 tablet 0  . SUMAtriptan (IMITREX) 25 MG tablet Take 1 tablet (25 mg total) by mouth every 2 (two) hours as needed for migraine. 10 tablet 0  . traMADol (ULTRAM) 50 MG tablet Take 50 mg by mouth every 8 (eight) hours as needed.  0   No current  facility-administered medications for this visit.    Allergies  Allergen Reactions  . Omeprazole Shortness Of Breath  . Penicillins Shortness Of Breath  . Shellfish Allergy Shortness Of Breath  . Soap Itching    ANYTHING WITH FRAGRANCE    Family History  Problem Relation Age of Onset  . Arthritis Mother   . Ovarian cancer Mother   . Hyperlipidemia Mother   . Hypertension Mother   . Arthritis Father   . Hypertension Father   . Asthma Father   . Asthma Sister   . Arthritis Paternal Grandmother   . Irritable bowel syndrome Mother     Social History   Social History  . Marital Status: Divorced    Spouse Name: N/A  . Number of Children: 0  . Years of Education: N/A   Occupational History  . CNA/ CPT    Social History Main Topics  . Smoking status: Never Smoker   . Smokeless tobacco: Never Used  . Alcohol Use: 0.0 oz/week    0 Standard drinks or equivalent per week     Comment: occasionally  . Drug Use: No  . Sexual Activity: Yes   Other Topics Concern  . Not on file   Social History Narrative     Constitutional: Denies fever, malaise, fatigue, headache or  abrupt weight changes.  Respiratory: Denies difficulty breathing, shortness of breath, cough or sputum production.   Cardiovascular: Denies chest pain, chest tightness, palpitations or swelling in the hands or feet.  Skin: Pt reports injection site pain and swelling.Denies rashes, lesions or ulcercations.   No other specific complaints in a complete review of systems (except as listed in HPI above).  Objective:   Physical Exam   BP 126/84 mmHg  Pulse 77  Temp(Src) 97.9 F (36.6 C) (Oral)  Wt 239 lb (108.41 kg)  SpO2 99% Wt Readings from Last 3 Encounters:  06/20/15 239 lb (108.41 kg)  04/02/15 251 lb (113.853 kg)  03/31/15 254 lb 6 oz (115.384 kg)    General: Appears her stated age,  in NAD. Skin: Warm, dry and intact. Localized injection site reaction noted on left upper  deltoid. Cardiovascular: Normal rate and rhythm. S1,S2 noted.   Pulmonary/Chest: Normal effort and positive vesicular breath sounds. No respiratory distress. No wheezes, rales or ronchi noted.   BMET    Component Value Date/Time   NA 136 04/01/2015 2120   NA 137 01/02/2014 1152   K 3.2* 04/01/2015 2120   K 3.9 01/02/2014 1152   CL 104 04/01/2015 2120   CL 102 01/02/2014 1152   CO2 24 04/01/2015 2120   CO2 26 01/02/2014 1152   GLUCOSE 116* 04/01/2015 2120   GLUCOSE 105* 01/02/2014 1152   BUN <5* 04/01/2015 2120   BUN 9 01/02/2014 1152   CREATININE 0.82 04/01/2015 2120   CREATININE 0.81 01/02/2014 1152   CALCIUM 9.0 04/01/2015 2120   CALCIUM 8.8 01/02/2014 1152   GFRNONAA >60 04/01/2015 2120   GFRNONAA >60 01/02/2014 1152   GFRAA >60 04/01/2015 2120   GFRAA >60 01/02/2014 1152    Lipid Panel  No results found for: CHOL, TRIG, HDL, CHOLHDL, VLDL, LDLCALC  CBC    Component Value Date/Time   WBC 11.9* 04/01/2015 2120   WBC 9.5 01/02/2014 1152   RBC 3.99 04/01/2015 2120   RBC 4.86 01/02/2014 1152   HGB 11.8* 04/01/2015 2120   HGB 14.5 01/02/2014 1152   HCT 35.4* 04/01/2015 2120   HCT 43.6 01/02/2014 1152   PLT 245 04/01/2015 2120   PLT 280 01/02/2014 1152   MCV 88.7 04/01/2015 2120   MCV 90 01/02/2014 1152   MCH 29.6 04/01/2015 2120   MCH 29.9 01/02/2014 1152   MCHC 33.3 04/01/2015 2120   MCHC 33.3 01/02/2014 1152   RDW 13.0 04/01/2015 2120   RDW 12.7 01/02/2014 1152   LYMPHSABS 3.5 04/01/2014 2234   LYMPHSABS 2.4 01/02/2014 1152   MONOABS 1.0 04/01/2014 2234   MONOABS 0.7 01/02/2014 1152   EOSABS 0.4 04/01/2014 2234   EOSABS 0.4 01/02/2014 1152   BASOSABS 0.0 04/01/2014 2234   BASOSABS 0.1 01/02/2014 1152    Hgb A1C No results found for: HGBA1C      Assessment & Plan:   Injection site reaction:  No infection noted Reassurance that this should resolve with time Place a warm compress on the affected area Ibuprofen as needed for pain and  swelling.  RTC as needed or if symptoms persist or worsen

## 2015-07-10 ENCOUNTER — Other Ambulatory Visit: Payer: Self-pay | Admitting: Internal Medicine

## 2015-07-15 ENCOUNTER — Encounter: Payer: Self-pay | Admitting: Primary Care

## 2015-07-15 ENCOUNTER — Ambulatory Visit (INDEPENDENT_AMBULATORY_CARE_PROVIDER_SITE_OTHER): Payer: PRIVATE HEALTH INSURANCE | Admitting: Primary Care

## 2015-07-15 VITALS — BP 118/76 | HR 76 | Temp 98.6°F | Ht 65.0 in | Wt 240.4 lb

## 2015-07-15 DIAGNOSIS — M62838 Other muscle spasm: Secondary | ICD-10-CM

## 2015-07-15 MED ORDER — CYCLOBENZAPRINE HCL 5 MG PO TABS: 5.0000 mg | ORAL_TABLET | Freq: Three times a day (TID) | ORAL | Status: DC | PRN

## 2015-07-15 NOTE — Progress Notes (Signed)
Subjective:    Patient ID: Mikayla AbbotLauren M Briggs, female    DOB: [birth year], 34 y.o.   MRN: [id number]  HPI  [name] is a 34 year old female who presents today with a chief complaint of hip pain. Her pain is located to the right hip and is have been present for 2 weeks. She describes her pain as a shooting/stabbing pain that is worse when she's on her feet for a prolonged amount of time. She works as an Set designerMRI tech and has had and increase in walking at work and also had to move several larger patients from her exam table to the stretcher. She's been taking Meloxicam as needed with some relief. Over the past 2-3 days her pain has become more noticeable. Denies numbness/tingling, trauma/injury.  Review of Systems  Constitutional: Negative for fever and chills.  Respiratory: Negative for shortness of breath.   Cardiovascular: Negative for chest pain.  Musculoskeletal: Positive for myalgias and back pain.  Neurological: Negative for numbness.       Past Medical History  Diagnosis Date  . Migraines   . Seasonal allergies   . 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 PUD (peptic ulcer disease)     Social History   Social History  . Marital Status: Divorced    Spouse Name: N/A  . Number of Children: 0  . Years of Education: N/A   Occupational History  . CNA/ CPT    Social History Main Topics  . Smoking status: Never Smoker   . Smokeless tobacco: Never Used  . Alcohol Use: 0.0 oz/week    0 Standard drinks or equivalent per week     Comment: occasionally  . Drug Use: No  . Sexual Activity: Yes   Other Topics Concern  . Not on file   Social History Narrative    Past Surgical History  Procedure Laterality Date  . Wisdom tooth extraction    . Tonsillectomy and adenoidectomy    . Cholecystectomy  11/2003  . Knee arthroscopy Left 2000  . Knee  arthroscopy with medial menisectomy Right 10/10/2014    Procedure: RIGHT KNEE ARTHROSCOPY; RESECTION PLICA; CHONDROPLASTY, AND PARTIAL MEDIAL MENISCECTOMY;  Surgeon: Loreta Aveaniel F Murphy, MD;  Location: [facility];  Service: Orthopedics;  Laterality: Right;    Family History  Problem Relation Age of Onset  . Arthritis Mother   . Ovarian cancer Mother   . Hyperlipidemia Mother   . Hypertension Mother   . Arthritis Father   . Hypertension Father   . Asthma Father   . Asthma Sister   . Arthritis Paternal Grandmother   . Irritable bowel syndrome Mother     Allergies  Allergen Reactions  . Omeprazole Shortness Of Breath  . Penicillins Shortness Of Breath  . Shellfish Allergy Shortness Of Breath  . Soap Itching    ANYTHING WITH FRAGRANCE    Current Outpatient Prescriptions on File Prior to Visit  Medication Sig Dispense Refill  . levonorgestrel-ethinyl estradiol (SEASONALE,INTROVALE,JOLESSA) 0.15-0.03 MG tablet Take 1 tablet by mouth daily.    . fexofenadine (ALLEGRA) 180 MG tablet Take 1 tablet (180 mg total) by mouth 2 (two) times daily. (Patient not taking: Reported on 07/15/2015) 60 tablet 4  . fluticasone (FLONASE) 50 MCG/ACT nasal spray Place 2 sprays into both nostrils daily. (Patient not taking: Reported on 07/15/2015) 16 g  1  . meloxicam (MOBIC) 15 MG tablet Take 15 mg by mouth daily.   3  . montelukast (SINGULAIR) 10 MG tablet TAKE ONE TABLET BY MOUTH EVERY NIGHT AT BEDTIME (Patient not taking: Reported on 07/15/2015) 30 tablet 2  . ondansetron (ZOFRAN) 4 MG tablet Take 1 tablet (4 mg total) by mouth every 8 (eight) hours as needed for nausea or vomiting. (Patient not taking: Reported on 07/15/2015) 40 tablet 0  . SUMAtriptan (IMITREX) 25 MG tablet Take 1 tablet (25 mg total) by mouth every 2 (two) hours as needed for migraine. (Patient not taking: Reported on 07/15/2015) 10 tablet 0  . traMADol (ULTRAM) 50 MG tablet Take 50 mg by mouth every 8 (eight) hours as needed.  0     No current facility-administered medications on file prior to visit.    BP 118/76 mmHg  Pulse 76  Temp(Src) 98.6 F (37 C) (Oral)  Ht  (1.651 m)  Wt 240 lb 6.4 oz (109.045 kg)  BMI 40.00 kg/m2  SpO2 99%    Objective:   Physical Exam  Constitutional: She appears well-nourished.  Cardiovascular: Normal rate and regular rhythm.   Pulmonary/Chest: Effort normal and breath sounds normal.  Musculoskeletal:       Right hip: She exhibits decreased range of motion and tenderness. She exhibits normal strength, no bony tenderness, no swelling, no crepitus and no deformity.  Tender upon palpation to muscular region of right hip. Unable to get good assessment of bony tenderness due to body habitus. Good ROM overall, decreased ROM with hip flexion in supine position.  Skin: Skin is warm and dry.          Assessment & Plan:  Hip pain:  Increased walking and exertion at work with new MRI machines. She's been exerting herself physically moreso than normal. Muscle tenderness below right hip. No bony tenderness. No decrease in ROM to hip or right lower extremity. Suspect muscle strain. Discussed NSAIDS and supportive treatment. Also provided RX for Flexeril PRN  Discussed that this may take several weeks to heal.  Follow up PRN.

## 2015-07-15 NOTE — Patient Instructions (Signed)
You've likely strained/torn a portion of your muscle.  You may continue taking the Meloxicam. You may take the Flexeril for any spasms. Take 1 tablet by mouth three times daily as needed. Please be advised that this may make you drowsy. You may start taking it at bedtime first.  Rest your hip but don't lay still too long.  Please call me if you have any question.  It was a pleasure to see you today!  Hip Pain Your hip is the joint between your upper legs and your lower pelvis. The bones, cartilage, tendons, and muscles of your hip joint perform a lot of work each day supporting your body weight and allowing you to move around. Hip pain can range from a minor ache to severe pain in one or both of your hips. Pain may be felt on the inside of the hip joint near the groin, or the outside near the buttocks and upper thigh. You may have swelling or stiffness as well.  HOME CARE INSTRUCTIONS   Take medicines only as directed by your health care provider.  Apply ice to the injured area:  Put ice in a plastic bag.  Place a towel between your skin and the bag.  Leave the ice on for 15-20 minutes at a time, 3-4 times a day.  Keep your leg raised (elevated) when possible to lessen swelling.  Avoid activities that cause pain.  Follow specific exercises as directed by your health care provider.  Sleep with a pillow between your legs on your most comfortable side.  Record how often you have hip pain, the location of the pain, and what it feels like. SEEK MEDICAL CARE IF:   You are unable to put weight on your leg.  Your hip is red or swollen or very tender to touch.  Your pain or swelling continues or worsens after 1 week.  You have increasing difficulty walking.  You have a fever. SEEK IMMEDIATE MEDICAL CARE IF:   You have fallen.  You have a sudden increase in pain and swelling in your hip. MAKE SURE YOU:   Understand these instructions.  Will watch your condition.  Will  get help right away if you are not doing well or get worse.   This information is not intended to replace advice given to you by your health care provider. Make sure you discuss any questions you have with your health care provider.   Document Released: 02/17/2010 Document Revised: 09/20/2014 Document Reviewed: 04/26/2013 Elsevier Interactive Patient Education [employer]2016 Elsevier Inc[employer]

## 2015-07-15 NOTE — Progress Notes (Signed)
Pre visit review using our clinic review tool, if applicable. No additional management support is needed unless otherwise documented below in the visit note. 

## 2015-08-09 ENCOUNTER — Encounter (HOSPITAL_COMMUNITY): Payer: Self-pay | Admitting: *Deleted

## 2015-08-09 ENCOUNTER — Emergency Department (INDEPENDENT_AMBULATORY_CARE_PROVIDER_SITE_OTHER)
Admission: EM | Admit: 2015-08-09 | Discharge: 2015-08-09 | Disposition: A | Discharge status: Home / Self Care | Payer: PRIVATE HEALTH INSURANCE | Source: Home / Self Care

## 2015-08-09 DIAGNOSIS — G43009 Migraine without aura, not intractable, without status migrainosus: Secondary | ICD-10-CM

## 2015-08-09 MED ORDER — KETOROLAC TROMETHAMINE 60 MG/2ML IM SOLN
INTRAMUSCULAR | Status: AC
  Filled 2015-08-09: qty 2

## 2015-08-09 MED ORDER — KETOROLAC TROMETHAMINE 60 MG/2ML IM SOLN
60.0000 mg | Freq: Once | INTRAMUSCULAR | Status: AC
  Administered 2015-08-09: 60 mg via INTRAMUSCULAR

## 2015-08-09 NOTE — Discharge Instructions (Signed)
Migraine Headache A migraine headache is very bad, throbbing pain on one or both sides of your head. Talk to your doctor about what things may bring on (trigger) your migraine headaches. HOME CARE  Only take medicines as told by your doctor.  Lie down in a dark, quiet room when you have a migraine.  Keep a journal to find out if certain things bring on migraine headaches. For example, write down:  What you eat and drink.  How much sleep you get.  Any change to your diet or medicines.  Lessen how much alcohol you drink.  Quit smoking if you smoke.  Get enough sleep.  Lessen any stress in your life.  Keep lights dim if bright lights bother you or make your migraines worse. GET HELP RIGHT AWAY IF:   Your migraine becomes really bad.  You have a fever.  You have a stiff neck.  You have trouble seeing.  Your muscles are weak, or you lose muscle control.  You lose your balance or have trouble walking.  You feel like you will pass out (faint), or you pass out.  You have really bad symptoms that are different than your first symptoms. MAKE SURE YOU:   Understand these instructions.  Will watch your condition.  Will get help right away if you are not doing well or get worse.   This information is not intended to replace advice given to you by your health care provider. Make sure you discuss any questions you have with your health care provider.   Document Released: 06/08/2008 Document Revised: 11/22/2011 Document Reviewed: 05/07/2013 Elsevier Interactive Patient Education 2016 Elsevier Inc. Eczema Eczema, also called atopic dermatitis, is a skin disorder that causes inflammation of the skin. It causes a red rash and dry, scaly skin. The skin becomes very itchy. Eczema is generally worse during the cooler winter months and often improves with the warmth of summer. Eczema usually starts showing signs in infancy. Some children outgrow eczema, but it may last through  adulthood.  CAUSES  The exact cause of eczema is not known, but it appears to run in families. People with eczema often have a family history of eczema, allergies, asthma, or hay fever. Eczema is not contagious. Flare-ups of the condition may be caused by:   Contact with something you are sensitive or allergic to.   Stress. SIGNS AND SYMPTOMS  Dry, scaly skin.   Red, itchy rash.   Itchiness. This may occur before the skin rash and may be very intense.  DIAGNOSIS  The diagnosis of eczema is usually made based on symptoms and medical history. TREATMENT  Eczema cannot be cured, but symptoms usually can be controlled with treatment and other strategies. A treatment plan might include:  Controlling the itching and scratching.   Use over-the-counter antihistamines as directed for itching. This is especially useful at night when the itching tends to be worse.   Use over-the-counter steroid creams as directed for itching.   Avoid scratching. Scratching makes the rash and itching worse. It may also result in a skin infection (impetigo) due to a break in the skin caused by scratching.   Keeping the skin well moisturized with creams every day. This will seal in moisture and help prevent dryness. Lotions that contain alcohol and water should be avoided because they can dry the skin.   Limiting exposure to things that you are sensitive or allergic to (allergens).   Recognizing situations that cause stress.   Developing a plan  to manage stress.  HOME CARE INSTRUCTIONS   Only take over-the-counter or prescription medicines as directed by your health care provider.   Do not use anything on the skin without checking with your health care provider.   Keep baths or showers short (5 minutes) in warm (not hot) water. Use mild cleansers for bathing. These should be unscented. You may add nonperfumed bath oil to the bath water. It is best to avoid soap and bubble bath.    Immediately after a bath or shower, when the skin is still damp, apply a moisturizing ointment to the entire body. This ointment should be a petroleum ointment. This will seal in moisture and help prevent dryness. The thicker the ointment, the better. These should be unscented.   Keep fingernails cut short. Children with eczema may need to wear soft gloves or mittens at night after applying an ointment.   Dress in clothes made of cotton or cotton blends. Dress lightly, because heat increases itching.   A child with eczema should stay away from anyone with fever blisters or cold sores. The virus that causes fever blisters (herpes simplex) can cause a serious skin infection in children with eczema. SEEK MEDICAL CARE IF:   Your itching interferes with sleep.   Your rash gets worse or is not better within 1 week after starting treatment.   You see pus or soft yellow scabs in the rash area.   You have a fever.   You have a rash flare-up after contact with someone who has fever blisters.    This information is not intended to replace advice given to you by your health care provider. Make sure you discuss any questions you have with your health care provider.   Document Released: 08/27/2000 Document Revised: 06/20/2013 Document Reviewed: 04/02/2013 Elsevier Interactive Patient Education [employer]

## 2015-08-09 NOTE — ED Notes (Signed)
Pt  Reports   Symptoms  Of  A  Headache   With  Nausea          X  3  Days    Pt  Also reports  A  Flair  Up of  Her    eczyma     X  2  Weeks    Pt  Has  Had  Migraines  In  Past

## 2015-08-12 ENCOUNTER — Ambulatory Visit (INDEPENDENT_AMBULATORY_CARE_PROVIDER_SITE_OTHER): Payer: PRIVATE HEALTH INSURANCE | Admitting: Internal Medicine

## 2015-08-12 ENCOUNTER — Encounter: Payer: Self-pay | Admitting: Internal Medicine

## 2015-08-12 VITALS — BP 122/78 | HR 102 | Temp 98.2°F | Wt 237.0 lb

## 2015-08-12 DIAGNOSIS — R251 Tremor, unspecified: Secondary | ICD-10-CM | POA: Diagnosis not present

## 2015-08-12 DIAGNOSIS — L309 Dermatitis, unspecified: Secondary | ICD-10-CM

## 2015-08-12 DIAGNOSIS — G43B1 Ophthalmoplegic migraine, intractable: Secondary | ICD-10-CM | POA: Diagnosis not present

## 2015-08-12 MED ORDER — TRIAMCINOLONE ACETONIDE 40 MG/ML IJ SUSP
40.0000 mg | Freq: Once | INTRAMUSCULAR | Status: AC
  Administered 2015-08-12: 40 mg via INTRAMUSCULAR

## 2015-08-12 MED ORDER — TRIAMCINOLONE ACETONIDE 0.1 % EX CREA: 1.0000 "application " | TOPICAL_CREAM | Freq: Two times a day (BID) | CUTANEOUS | Status: DC

## 2015-08-12 NOTE — Progress Notes (Signed)
Subjective:    Patient ID: Mikayla Briggs, female    DOB: April 21, 1981, 34 y.o.   MRN: [id number]  HPI  Pt presents to the clinic today with c/o a headache. This started 4 days ago. It is located on the right side of her head and radiates down to the right side of her neck. She tried Tramadol and Imitrex without relief. She went to [facility] 08/09/15 for the same, and was given a Toradol shot with good relief. She then woke up yesterday with a headache behind her left eye. She describes the pain as a slight ache. It has gotten better since that time. She denies blurred vision. She is going to call today for an eye exam.   She also c/o a rash on her left arm. She noticed this. She thinks it is eczema. She has been putting Hydrocortisone cream without relief. She reports she has not had a breakout in 2 years.  She also c/o shaking in her right hand. This occurred on Thanksgiving day while she was fixing a plate of food. The shaking did not stop for about 2 minutes. It has happened before, but never lasted that long. She had already eaten, and did not feel like her sugar was low. She denies numbness or tingling in her hands. She has not tried anything OTC for this.  Review of Systems  Past Medical History  Diagnosis Date  . Migraines   . Seasonal allergies   . 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 PUD (peptic ulcer disease)     Current Outpatient Prescriptions  Medication Sig Dispense Refill  . cyclobenzaprine (FLEXERIL) 5 MG tablet Take 1 tablet (5 mg total) by mouth 3 (three) times daily as needed for muscle spasms. 30 tablet 0  . fexofenadine (ALLEGRA) 180 MG tablet Take 1 tablet (180 mg total) by mouth 2 (two) times daily. 60 tablet 4  . fluticasone (FLONASE) 50 MCG/ACT nasal spray Place 2 sprays into both nostrils daily. 16 g 1  . levonorgestrel-ethinyl  estradiol (SEASONALE,INTROVALE,JOLESSA) 0.15-0.03 MG tablet Take 1 tablet by mouth daily.    . meloxicam (MOBIC) 15 MG tablet Take 15 mg by mouth as needed.   3  . montelukast (SINGULAIR) 10 MG tablet TAKE ONE TABLET BY MOUTH EVERY NIGHT AT BEDTIME 30 tablet 2  . ondansetron (ZOFRAN) 4 MG tablet Take 1 tablet (4 mg total) by mouth every 8 (eight) hours as needed for nausea or vomiting. 40 tablet 0  . SUMAtriptan (IMITREX) 25 MG tablet Take 1 tablet (25 mg total) by mouth every 2 (two) hours as needed for migraine. 10 tablet 0  . traMADol (ULTRAM) 50 MG tablet Take 50 mg by mouth every 8 (eight) hours as needed.  0   No current facility-administered medications for this visit.    Allergies  Allergen Reactions  . Omeprazole Shortness Of Breath  . Penicillins Shortness Of Breath  . Shellfish Allergy Shortness Of Breath  . Soap Itching    ANYTHING WITH FRAGRANCE    Family History  Problem Relation Age of Onset  . Arthritis Mother   . Ovarian cancer Mother   . Hyperlipidemia Mother   . Hypertension Mother   . Arthritis Father   . Hypertension Father   . Asthma Father   . Asthma Sister   . Arthritis Paternal Grandmother   .  Irritable bowel syndrome Mother     Social History   Social History  . Marital Status: Divorced    Spouse Name: N/A  . Number of Children: 0  . Years of Education: N/A   Occupational History  . CNA/ CPT    Social History Main Topics  . Smoking status: Never Smoker   . Smokeless tobacco: Never Used  . Alcohol Use: 0.0 oz/week    0 Standard drinks or equivalent per week     Comment: occasionally  . Drug Use: No  . Sexual Activity: Yes   Other Topics Concern  . Not on file   Social History Narrative     Constitutional: Pt reports headache. Denies fever, malaise, fatigue, or abrupt weight changes.  HEENT: Denies eye pain, eye redness, ear pain, ringing in the ears, wax buildup, runny nose, nasal congestion, bloody nose, or sore  throat. Respiratory: Denies difficulty breathing, shortness of breath, cough or sputum production.   Cardiovascular: Denies chest pain, chest tightness, palpitations or swelling in the hands or feet.  Skin: Pt reports rash on left arm. Denies redness, lesions or ulcercations.  Neurological: Pt reports tremor of right hand. Denies dizziness, difficulty with memory, difficulty with speech or problems with balance and coordination.  Psych: Denies anxiety, depression, SI/HI.  No other specific complaints in a complete review of systems (except as listed in HPI above).     Objective:   Physical Exam   BP 122/78 mmHg  Pulse 102  Temp(Src) 98.2 F (36.8 C) (Oral)  Wt 237 lb (107.502 kg)  SpO2 98% Wt Readings from Last 3 Encounters:  08/12/15 237 lb (107.502 kg)  07/15/15 240 lb 6.4 oz (109.045 kg)  06/20/15 239 lb (108.41 kg)    General: Appears her stated age, obese in NAD. Skin: Warm, dry and intact. Patchy, scaly rash noted on left antecubital fossa. HEENT: Head: normal shape and size; Eyes: sclera white, no icterus, conjunctiva pink, PERRLA and EOMs intact;  Cardiovascular: Normal rate and rhythm. S1,S2 noted.  No murmur, rubs or gallops noted. Cap refill < 3 secs. Radial pulse 2+. Pulmonary/Chest: Normal effort and positive vesicular breath sounds. No respiratory distress. No wheezes, rales or ronchi noted.  Neurological: Alert and oriented. Sensation intact. Coordination normal.  Psychiatric: She does seem mildly anxious today.  BMET    Component Value Date/Time   NA 136 04/01/2015 2120   NA 137 01/02/2014 1152   K 3.2* 04/01/2015 2120   K 3.9 01/02/2014 1152   CL 104 04/01/2015 2120   CL 102 01/02/2014 1152   CO2 24 04/01/2015 2120   CO2 26 01/02/2014 1152   GLUCOSE 116* 04/01/2015 2120   GLUCOSE 105* 01/02/2014 1152   BUN <5* 04/01/2015 2120   BUN 9 01/02/2014 1152   CREATININE 0.82 04/01/2015 2120   CREATININE 0.81 01/02/2014 1152   CALCIUM 9.0 04/01/2015 2120    CALCIUM 8.8 01/02/2014 1152   GFRNONAA >60 04/01/2015 2120   GFRNONAA >60 01/02/2014 1152   GFRAA >60 04/01/2015 2120   GFRAA >60 01/02/2014 1152    Lipid Panel  No results found for: CHOL, TRIG, HDL, CHOLHDL, VLDL, LDLCALC  CBC    Component Value Date/Time   WBC 11.9* 04/01/2015 2120   WBC 9.5 01/02/2014 1152   RBC 3.99 04/01/2015 2120   RBC 4.86 01/02/2014 1152   HGB 11.8* 04/01/2015 2120   HGB 14.5 01/02/2014 1152   HCT 35.4* 04/01/2015 2120   HCT 43.6 01/02/2014 1152   PLT 245  04/01/2015 2120   PLT 280 01/02/2014 1152   MCV 88.7 04/01/2015 2120   MCV 90 01/02/2014 1152   MCH 29.6 04/01/2015 2120   MCH 29.9 01/02/2014 1152   MCHC 33.3 04/01/2015 2120   MCHC 33.3 01/02/2014 1152   RDW 13.0 04/01/2015 2120   RDW 12.7 01/02/2014 1152   LYMPHSABS 3.5 04/01/2014 2234   LYMPHSABS 2.4 01/02/2014 1152   MONOABS 1.0 04/01/2014 2234   MONOABS 0.7 01/02/2014 1152   EOSABS 0.4 04/01/2014 2234   EOSABS 0.4 01/02/2014 1152   BASOSABS 0.0 04/01/2014 2234   BASOSABS 0.1 01/02/2014 1152    Hgb A1C No results found for: HGBA1C      Assessment & Plan:   Headache:  UC notes reviewed Seems to have improved She will continue Imitrex and Tramadol for now If not effective, consider switching to Maxalt  Eczema, left arm:  Kenalog injection today eRx for Triamcinolone cream BID prn Avoid hot baths/showers or drying soaps Put on a moisturizing lotion BID  Right hand tremor:  Exam benign Consider referral to Neuro for further evaluation  RTC as needed or if symptoms persist or worsen

## 2015-08-12 NOTE — Addendum Note (Signed)
Addended by: Roena MaladyEVONTENNO, Kincaid Tiger Y on: 08/12/2015 04:03 PM   Modules accepted: Orders

## 2015-08-12 NOTE — Patient Instructions (Signed)
Eczema Eczema, also called atopic dermatitis, is a skin disorder that causes inflammation of the skin. It causes a red rash and dry, scaly skin. The skin becomes very itchy. Eczema is generally worse during the cooler winter months and often improves with the warmth of summer. Eczema usually starts showing signs in infancy. Some children outgrow eczema, but it may last through adulthood.  CAUSES  The exact cause of eczema is not known, but it appears to run in families. People with eczema often have a family history of eczema, allergies, asthma, or hay fever. Eczema is not contagious. Flare-ups of the condition may be caused by:   Contact with something you are sensitive or allergic to.   Stress. SIGNS AND SYMPTOMS  Dry, scaly skin.   Red, itchy rash.   Itchiness. This may occur before the skin rash and may be very intense.  DIAGNOSIS  The diagnosis of eczema is usually made based on symptoms and medical history. TREATMENT  Eczema cannot be cured, but symptoms usually can be controlled with treatment and other strategies. A treatment plan might include:  Controlling the itching and scratching.   Use over-the-counter antihistamines as directed for itching. This is especially useful at night when the itching tends to be worse.   Use over-the-counter steroid creams as directed for itching.   Avoid scratching. Scratching makes the rash and itching worse. It may also result in a skin infection (impetigo) due to a break in the skin caused by scratching.   Keeping the skin well moisturized with creams every day. This will seal in moisture and help prevent dryness. Lotions that contain alcohol and water should be avoided because they can dry the skin.   Limiting exposure to things that you are sensitive or allergic to (allergens).   Recognizing situations that cause stress.   Developing a plan to manage stress.  HOME CARE INSTRUCTIONS   Only take over-the-counter or  prescription medicines as directed by your health care provider.   Do not use anything on the skin without checking with your health care provider.   Keep baths or showers short (5 minutes) in warm (not hot) water. Use mild cleansers for bathing. These should be unscented. You may add nonperfumed bath oil to the bath water. It is best to avoid soap and bubble bath.   Immediately after a bath or shower, when the skin is still damp, apply a moisturizing ointment to the entire body. This ointment should be a petroleum ointment. This will seal in moisture and help prevent dryness. The thicker the ointment, the better. These should be unscented.   Keep fingernails cut short. Children with eczema may need to wear soft gloves or mittens at night after applying an ointment.   Dress in clothes made of cotton or cotton blends. Dress lightly, because heat increases itching.   A child with eczema should stay away from anyone with fever blisters or cold sores. The virus that causes fever blisters (herpes simplex) can cause a serious skin infection in children with eczema. SEEK MEDICAL CARE IF:   Your itching interferes with sleep.   Your rash gets worse or is not better within 1 week after starting treatment.   You see pus or soft yellow scabs in the rash area.   You have a fever.   You have a rash flare-up after contact with someone who has fever blisters.    This information is not intended to replace advice given to you by your health care   provider. Make sure you discuss any questions you have with your health care provider.   Document Released: 08/27/2000 Document Revised: 06/20/2013 Document Reviewed: 04/02/2013 Elsevier Interactive Patient Education 2016 [employer]  

## 2015-08-12 NOTE — Progress Notes (Signed)
Pre visit review using our clinic review tool, if applicable. No additional management support is needed unless otherwise documented below in the visit note. 

## 2015-08-14 NOTE — ED Provider Notes (Signed)
CSN: [id number]     Arrival date & time 08/09/15  1808 History   None    Chief Complaint  Patient presents with  . Migraine   (Consider location/radiation/quality/duration/timing/severity/associated sxs/prior Treatment) HPI  Past Medical History  Diagnosis Date  . Migraines   . Seasonal allergies   . 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 PUD (peptic ulcer disease)    Past Surgical History  Procedure Laterality Date  . Wisdom tooth extraction    . Tonsillectomy and adenoidectomy    . Cholecystectomy  11/2003  . Knee arthroscopy Left 2000  . Knee arthroscopy with medial menisectomy Right 10/10/2014    Procedure: RIGHT KNEE ARTHROSCOPY; RESECTION PLICA; CHONDROPLASTY, AND PARTIAL MEDIAL MENISCECTOMY;  Surgeon: Loreta Ave, MD;  Location: [facility];  Service: Orthopedics;  Laterality: Right;   Family History  Problem Relation Age of Onset  . Arthritis Mother   . Ovarian cancer Mother   . Hyperlipidemia Mother   . Hypertension Mother   . Arthritis Father   . Hypertension Father   . Asthma Father   . Asthma Sister   . Arthritis Paternal Grandmother   . Irritable bowel syndrome Mother    Social History  Substance Use Topics  . Smoking status: Never Smoker   . Smokeless tobacco: Never Used  . Alcohol Use: 0.0 oz/week    0 Standard drinks or equivalent per week     Comment: occasionally   OB History    No data available     Review of Systems  Allergies  Omeprazole; Penicillins; Shellfish allergy; and Soap  Home Medications   Prior to Admission medications   Medication Sig Start Date End Date Taking? Authorizing Provider  cyclobenzaprine (FLEXERIL) 5 MG tablet Take 1 tablet (5 mg total) by mouth 3 (three) times daily as needed for muscle spasms. 07/15/15   Doreene Nest, NP  fexofenadine (ALLEGRA) 180 MG tablet  Take 1 tablet (180 mg total) by mouth 2 (two) times daily. 09/25/14   Lorre Munroe, NP  fluticasone (FLONASE) 50 MCG/ACT nasal spray Place 2 sprays into both nostrils daily. 01/16/15   Doreene Nest, NP  levonorgestrel-ethinyl estradiol (SEASONALE,INTROVALE,JOLESSA) 0.15-0.03 MG tablet Take 1 tablet by mouth daily.    Historical Provider, MD  meloxicam (MOBIC) 15 MG tablet Take 15 mg by mouth as needed.  10/18/14   Historical Provider, MD  montelukast (SINGULAIR) 10 MG tablet TAKE ONE TABLET BY MOUTH EVERY NIGHT AT BEDTIME 07/10/15   Lorre Munroe, NP  ondansetron (ZOFRAN) 4 MG tablet Take 1 tablet (4 mg total) by mouth every 8 (eight) hours as needed for nausea or vomiting. 10/10/14   Cristie Hem, PA-C  SUMAtriptan (IMITREX) 25 MG tablet Take 1 tablet (25 mg total) by mouth every 2 (two) hours as needed for migraine. 01/20/15   Lorre Munroe, NP  traMADol (ULTRAM) 50 MG tablet Take 50 mg by mouth every 8 (eight) hours as needed. 08/29/14   Historical Provider, MD  triamcinolone cream (KENALOG) 0.1 % Apply 1 application topically 2 (two) times daily. 08/12/15   Lorre Munroe, NP   Meds Ordered and Administered this Visit   Medications  ketorolac (TORADOL) injection 60 mg (60 mg Intramuscular Given 08/09/15 1929)    BP 130/97 mmHg  Pulse 76  Temp(Src) 98.3 F (36.8 C) (Oral)  Resp 18  SpO2 100% No data found.   Physical Exam  Constitutional: She is oriented to person, place, and time. She appears well-developed and well-nourished. No distress.  HENT:  Head: Normocephalic and atraumatic.  Right Ear: External ear normal.  Left Ear: External ear normal.  Mouth/Throat: Oropharynx is clear and moist.  Eyes: Conjunctivae are normal.  Neck: Normal range of motion. Neck supple.  Cardiovascular: Normal rate, regular rhythm and normal heart sounds.   Pulmonary/Chest: Effort normal and breath sounds normal.  Musculoskeletal: Normal range of motion.  Neurological: She is alert and  oriented to person, place, and time.  Skin: Skin is warm and dry.  Psychiatric: She has a normal mood and affect. Her behavior is normal. Judgment and thought content normal.    ED Course  Procedures (including critical care time)  Labs Review Labs Reviewed - No data to display  Imaging Review No results found.   Visual Acuity Review  Right Eye Distance:   Left Eye Distance:   Bilateral Distance:    Right Eye Near:   Left Eye Near:    Bilateral Near:         MDM   1. Migraine without aura and without status migrainosus, not intractable    Pt states that when her home meds fail, a dose of Toradol works well for her. Pt states she has transportation home, and is not driving.  toradol administered with good improvement of symptoms.      Tharon AquasFrank C Patrick, PA 08/14/15 1131

## 2015-09-04 ENCOUNTER — Telehealth: Payer: Self-pay

## 2015-09-04 NOTE — Telephone Encounter (Signed)
Pt left v/m; pt said R Baity NP told pt next migraine to try taking imitrex 2 tabs to see if that would relieve migraine h/a; pt did that and it did help get rid of migraine and pt request new rx imitrex with instructions [place] 2 tabs when needed for [place] to [place].Please advise.(did not change instructions until approved by Pamala Hurry Baity NP.)

## 2015-09-05 ENCOUNTER — Other Ambulatory Visit: Payer: Self-pay | Admitting: Internal Medicine

## 2015-09-05 MED ORDER — SUMATRIPTAN SUCCINATE 50 MG PO TABS: 50.0000 mg | ORAL_TABLET | ORAL | Status: DC | PRN

## 2015-09-05 NOTE — Telephone Encounter (Signed)
Pt is aware as instructed 

## 2015-09-05 NOTE — Telephone Encounter (Signed)
I sent in RX for Imitrex 50 mg because her insurance will only approve 10 pills per month and she may end up going through the 25 mg tabs because she is using twice as much and I don't want her to run out.

## 2015-09-16 ENCOUNTER — Ambulatory Visit (INDEPENDENT_AMBULATORY_CARE_PROVIDER_SITE_OTHER): Payer: PRIVATE HEALTH INSURANCE | Admitting: Family Medicine

## 2015-09-16 ENCOUNTER — Emergency Department (HOSPITAL_COMMUNITY)
Admission: EM | Admit: 2015-09-16 | Discharge: 2015-09-16 | Disposition: A | Discharge status: Home / Self Care | Payer: Self-pay

## 2015-09-16 ENCOUNTER — Encounter: Payer: Self-pay | Admitting: Family Medicine

## 2015-09-16 ENCOUNTER — Ambulatory Visit
Admission: RE | Admit: 2015-09-16 | Discharge: 2015-09-16 | Disposition: A | Discharge status: Home / Self Care | Payer: PRIVATE HEALTH INSURANCE | Source: Ambulatory Visit | Attending: Family Medicine | Admitting: Family Medicine

## 2015-09-16 VITALS — BP 100/84 | HR 90 | Temp 98.2°F | Wt 239.0 lb

## 2015-09-16 DIAGNOSIS — R0602 Shortness of breath: Secondary | ICD-10-CM | POA: Diagnosis not present

## 2015-09-16 DIAGNOSIS — R059 Cough, unspecified: Secondary | ICD-10-CM

## 2015-09-16 DIAGNOSIS — R05 Cough: Secondary | ICD-10-CM

## 2015-09-16 DIAGNOSIS — J208 Acute bronchitis due to other specified organisms: Secondary | ICD-10-CM | POA: Diagnosis not present

## 2015-09-16 MED ORDER — PREDNISONE 20 MG PO TABS: ORAL_TABLET | ORAL | Status: DC

## 2015-09-16 NOTE — Progress Notes (Signed)
PCP: Nicki Reaper, NP  Subjective:  Mikayla Briggs is a 35 y.o. year old very pleasant female patient who presents with concern of bronchitis. symptoms including cough, chest congestion. Some but minimal nasal congestion, sore throat. cough productive yellow sputum. Also has some wheeze and mild shortness of breath. She has some albuterol which helps with cough/wheeze/shortness of breath.  -started: 2 days ago - symptoms are worsening -previous treatments: mucinex, albuterol -sick contacts/travel/risks: denies flu exposure. Mikayla Briggs has been sick with similar illness since Christmas.  -history of both pneumonia and bronchitis -Hx of: allergies  ROS-denies fever but states temperature elevated for her. denies NVD, tooth pain  Pertinent Past Medical History- severe obesity, migraines  Medications- reviewed  Current Outpatient Prescriptions  Medication Sig Dispense Refill  . fexofenadine (ALLEGRA) 180 MG tablet Take 1 tablet (180 mg total) by mouth 2 (two) times daily. 60 tablet 4  . levonorgestrel-ethinyl estradiol (SEASONALE,INTROVALE,JOLESSA) 0.15-0.03 MG tablet Take 1 tablet by mouth daily.    . meloxicam (MOBIC) 15 MG tablet Take 15 mg by mouth as needed.   3  . montelukast (SINGULAIR) 10 MG tablet TAKE ONE TABLET BY MOUTH EVERY NIGHT AT BEDTIME 30 tablet 2  . triamcinolone cream (KENALOG) 0.1 % Apply 1 application topically 2 (two) times daily. 30 g 0  . cyclobenzaprine (FLEXERIL) 5 MG tablet Take 1 tablet (5 mg total) by mouth 3 (three) times daily as needed for muscle spasms. (Patient not taking: Reported on 09/16/2015) 30 tablet 0  . ondansetron (ZOFRAN) 4 MG tablet Take 1 tablet (4 mg total) by mouth every 8 (eight) hours as needed for nausea or vomiting. (Patient not taking: Reported on 09/16/2015) 40 tablet 0  . SUMAtriptan (IMITREX) 50 MG tablet Take 1 tablet (50 mg total) by mouth every 2 (two) hours as needed for migraine. May repeat in 2 hours if headache persists or recurs.  (Patient not taking: Reported on 09/16/2015) 10 tablet 0  . traMADol (ULTRAM) 50 MG tablet Take 50 mg by mouth every 8 (eight) hours as needed. Reported on 09/16/2015  0   Objective: BP 100/84 mmHg  Pulse 90  Temp(Src) 98.2 F (36.8 C)  Wt 239 lb (108.41 kg)  SpO2 98% Gen: NAD, resting comfortably HEENT: Turbinates erythematous, TM normal, pharynx mildly erythematous with no tonsilar exudate or edema, no sinus tenderness CV: RRR no murmurs rubs or gallops Lungs: diffuse rhonchi with sparing diffuse wheeze (albuterol before visit) Abdomen: soft/nontender/nondistended/normal bowel sounds. No rebound or guarding.  Ext: no edema Skin: warm, dry, no rash Neuro: grossly normal, moves all extremities  Assessment/Plan:  Viral Bronchitis History and exam today are suggestive of viral bronchitis. Due to patient concern we did a CXR which was negative for pneumonia.     We discussed that a serious bacterial infection or illness is unlikely. We also discussed reasons why current illness does not meet criteria for bacterial illness and therefore no antibiotics indicated. Also educated on signs that bacterial infection may have developed. We discussed treatment side effects, likely course, antibiotic misuse, transmission, and signs of developing a serious illness.  Symptomatic treatment with: prednisone taper.   Finally, we reviewed reasons to return to care including if symptoms worsen or persist or new concerns arise. Discussed this could last up to 6 weeks but hopefully improving within a month.   Meds ordered this encounter  Medications  . predniSONE (DELTASONE) 20 MG tablet    Sig: Take 2 pills for 3 days, 1 pill for 3 days, 1/2  pill for 3 days, 1/2 pill every other day until finished    Dispense:  12 tablet    Refill:  0

## 2015-09-16 NOTE — Patient Instructions (Signed)
CXR if positive for pneumonia treat with antibiotics If negative, treat with prednisone  I suspect this is viral bronchitis and will last 6 weeks honestly  If worsening symptoms or temp over 100.5- return to care

## 2015-09-19 ENCOUNTER — Other Ambulatory Visit: Payer: Self-pay | Admitting: Internal Medicine

## 2015-10-24 ENCOUNTER — Ambulatory Visit (INDEPENDENT_AMBULATORY_CARE_PROVIDER_SITE_OTHER): Payer: PRIVATE HEALTH INSURANCE | Admitting: Family Medicine

## 2015-10-24 ENCOUNTER — Encounter: Payer: Self-pay | Admitting: Family Medicine

## 2015-10-24 VITALS — BP 158/108 | HR 79 | Temp 98.0°F | Ht 65.0 in | Wt 233.5 lb

## 2015-10-24 DIAGNOSIS — H1010 Acute atopic conjunctivitis, unspecified eye: Secondary | ICD-10-CM | POA: Insufficient documentation

## 2015-10-24 DIAGNOSIS — H578 Other specified disorders of eye and adnexa: Secondary | ICD-10-CM | POA: Diagnosis not present

## 2015-10-24 DIAGNOSIS — R251 Tremor, unspecified: Secondary | ICD-10-CM | POA: Diagnosis not present

## 2015-10-24 DIAGNOSIS — R21 Rash and other nonspecific skin eruption: Secondary | ICD-10-CM

## 2015-10-24 DIAGNOSIS — H1011 Acute atopic conjunctivitis, right eye: Secondary | ICD-10-CM | POA: Diagnosis not present

## 2015-10-24 DIAGNOSIS — F419 Anxiety disorder, unspecified: Secondary | ICD-10-CM | POA: Insufficient documentation

## 2015-10-24 DIAGNOSIS — H5789 Other specified disorders of eye and adnexa: Secondary | ICD-10-CM | POA: Insufficient documentation

## 2015-10-24 MED ORDER — SERTRALINE HCL 25 MG PO TABS: 25.0000 mg | ORAL_TABLET | Freq: Every day | ORAL | Status: DC

## 2015-10-24 MED ORDER — METHYLPREDNISOLONE ACETATE 40 MG/ML IJ SUSP
40.0000 mg | Freq: Once | INTRAMUSCULAR | Status: AC
  Administered 2015-10-24: 40 mg via INTRAMUSCULAR

## 2015-10-24 NOTE — Assessment & Plan Note (Signed)
History of; recent worsening. Likely exacerbating tremor. Patient has been on Zoloft in the past and requested to restart today. Rx sent. Patient to follow up with PCP.

## 2015-10-24 NOTE — Assessment & Plan Note (Signed)
Likely related stress/anxiety; could be essential tremor. Advised decreasing stress. Follow up closely with PCP.

## 2015-10-24 NOTE — Progress Notes (Signed)
Subjective:  Patient ID: Mikayla Briggs, female    DOB: 1981-02-23  Age: 35 y.o. MRN: [id number]  CC: R eye swelling, watering; Hand tremor; Anxiety  HPI:  35 year old female presents with the above complaints.  Patient developed right eye swelling, redness, and watering on Wednesday. She states the eye is also red. Eye has been watering. No drainage/crusting. Vision intact. No reports of photophobia. She states that she has had this previously and was secondary to underlying allergies. She has a long-standing history of allergies. No exacerbating or relieving factors.  Patient also reports that she has been experiencing hand tremor for several weeks. Occurs with use of hands. Not at rest. She states that it has become more noticeable and bothersome. Its source of considerable anxiety for her. No known exacerbating or relieving factors.  Additionally, patient reports significant anxiety. She's been quite anxious about the tremor as indicated above. She's been under a great deal of stress as she is trying to prepare for a wedding.  Social Hx   Social History   Social History  . Marital Status: Divorced    Spouse Name: N/A  . Number of Children: 0  . Years of Education: N/A   Occupational History  . CNA/ CPT    Social History Main Topics  . Smoking status: Never Smoker   . Smokeless tobacco: Never Used  . Alcohol Use: 0.0 oz/week    0 Standard drinks or equivalent per week     Comment: occasionally  . Drug Use: No  . Sexual Activity: Yes   Other Topics Concern  . None   Social History Narrative   Review of Systems  Constitutional: Negative.   Eyes: Positive for redness. Negative for photophobia.  Neurological: Positive for tremors.  Psychiatric/Behavioral: The patient is nervous/anxious.     Objective:  BP 158/108 mmHg  Pulse 79  Temp(Src) 98 F (36.7 C) (Oral)  Ht  (1.651 m)  Wt 233 lb 8 oz (105.915 kg)  BMI 38.86 kg/m2  SpO2 99%  BP/Weight  10/24/2015 09/16/2015 08/12/2015  Systolic BP 158 100 122  Diastolic BP 108 84 78  Wt. (Lbs) 233.5 239 237  BMI 38.86 39.77 39.44   Physical Exam  Constitutional: She is oriented to person, place, and time. She appears well-developed. No distress.  Eyes: EOM are normal. Pupils are equal, round, and reactive to light. Right eye exhibits no discharge. Left eye exhibits no discharge.  Right eye with mild periorbital swelling. Injected conjunctiva. No drainage.  Pulmonary/Chest: Effort normal.  Neurological: She is alert and oriented to person, place, and time.  No focal deficits. EOMI. Bilateral hand tremor was noted. Was not noted at rest.  Psychiatric:  Anxious. Tearful during history.  Vitals reviewed.  Lab Results  Component Value Date   WBC 11.9* 04/01/2015   HGB 11.8* 04/01/2015   HCT 35.4* 04/01/2015   PLT 245 04/01/2015   GLUCOSE 116* 04/01/2015   ALT 17 03/12/2015   AST 24 03/12/2015   NA 136 04/01/2015   K 3.2* 04/01/2015   CL 104 04/01/2015   CREATININE 0.82 04/01/2015   BUN <5* 04/01/2015   CO2 24 04/01/2015    Assessment & Plan:   Problem List Items Addressed This Visit    Periorbital swelling   Relevant Medications   methylPREDNISolone acetate (DEPO-MEDROL) injection 40 mg (Completed)   Allergic conjunctivitis - Primary    New acute problem. History and exam consistent with allergic in nature. We discussed treatment  options.  Patient reports that this is severe and requested treatment with IM steroid. IM Depo medrol given today.       Relevant Medications   methylPREDNISolone acetate (DEPO-MEDROL) injection 40 mg (Completed)   Anxiety    History of; recent worsening. Likely exacerbating tremor. Patient has been on Zoloft in the past and requested to restart today. Rx sent. Patient to follow up with PCP.      Relevant Medications   sertraline (ZOLOFT) 25 MG tablet   Tremor    Likely related stress/anxiety; could be essential tremor. Advised  decreasing stress. Follow up closely with PCP.         Meds ordered this encounter  Medications  . sertraline (ZOLOFT) 25 MG tablet    Sig: Take 1 tablet (25 mg total) by mouth daily.    Dispense:  30 tablet    Refill:  1  . methylPREDNISolone acetate (DEPO-MEDROL) injection 40 mg    Sig:     Follow-up: PRN  Everlene Other DO [facility]

## 2015-10-24 NOTE — Assessment & Plan Note (Signed)
New acute problem. History and exam consistent with allergic in nature. We discussed treatment options.  Patient reports that this is severe and requested treatment with IM steroid. IM Depo medrol given today.

## 2015-10-24 NOTE — Progress Notes (Signed)
Pre visit review using our clinic review tool, if applicable. No additional management support is needed unless otherwise documented below in the visit note. 

## 2015-11-25 ENCOUNTER — Ambulatory Visit: Payer: PRIVATE HEALTH INSURANCE | Admitting: Primary Care

## 2015-11-25 ENCOUNTER — Ambulatory Visit (INDEPENDENT_AMBULATORY_CARE_PROVIDER_SITE_OTHER): Payer: PRIVATE HEALTH INSURANCE | Admitting: Internal Medicine

## 2015-11-25 ENCOUNTER — Encounter: Payer: Self-pay | Admitting: Internal Medicine

## 2015-11-25 VITALS — BP 124/82 | HR 93 | Temp 98.5°F | Wt 232.0 lb

## 2015-11-25 DIAGNOSIS — G43819 Other migraine, intractable, without status migrainosus: Secondary | ICD-10-CM | POA: Diagnosis not present

## 2015-11-25 DIAGNOSIS — R251 Tremor, unspecified: Secondary | ICD-10-CM | POA: Diagnosis not present

## 2015-11-25 DIAGNOSIS — F419 Anxiety disorder, unspecified: Secondary | ICD-10-CM | POA: Diagnosis not present

## 2015-11-25 MED ORDER — SUMATRIPTAN SUCCINATE 100 MG PO TABS: 100.0000 mg | ORAL_TABLET | ORAL | Status: DC | PRN

## 2015-11-25 NOTE — Patient Instructions (Signed)

## 2015-11-25 NOTE — Progress Notes (Signed)
Pre visit review using our clinic review tool, if applicable. No additional management support is needed unless otherwise documented below in the visit note. 

## 2015-11-25 NOTE — Progress Notes (Signed)
Subjective:    Patient ID: Mikayla Briggs, female    DOB: [birth year], 35 y.o.   MRN: [id number]  HPI  Pt presents to the clinic today with c/o migraines. This started 1 week ago. The pain is located in the right side of the back of her head. It has radiated up to the front of her head. She has sensitivity to light and nausea. She denies blurred vision or dizziness. She has tried Flexeril, Imitrex an Ultram with minimal relief. She went to the UC for the same and received an injection of Toradol and Zofran. She reports this finally helped her symptoms to subside. She has had migraines in the past, but reports this one was slightly different. She is having 1 migraine every 3 weeks. She is under a lot of stress right now, she is getting married in 1 month. She was started on Zoloft 1 month ago to help her handle her stress, and she does feel like it is working. She is not experiencing crying spells and mood swings like she was.   She is also concerned about hand shaking. She noticed this about 1 month ago. It can occur at anytime. It does not interfere with her ability to write or do her job. She is not having any joint pain, swelling or decreased range of motion in her hands. She is having some anxiety but feels like that has improved since starting Zoloft.  Review of Systems  Past Medical History  Diagnosis Date  . Migraines   . Seasonal allergies   . 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 PUD (peptic ulcer disease)     Current Outpatient Prescriptions  Medication Sig Dispense Refill  . cyclobenzaprine (FLEXERIL) 5 MG tablet Take 1 tablet (5 mg total) by mouth 3 (three) times daily as needed for muscle spasms. 30 tablet 0  . fexofenadine (ALLEGRA) 180 MG tablet Take 1 tablet (180 mg total) by mouth 2 (two) times daily. 60 tablet 4  . levonorgestrel-ethinyl  estradiol (SEASONALE,INTROVALE,JOLESSA) 0.15-0.03 MG tablet Take 1 tablet by mouth daily.    . montelukast (SINGULAIR) 10 MG tablet TAKE ONE TABLET BY MOUTH AT BEDTIME 30 tablet 6  . sertraline (ZOLOFT) 25 MG tablet Take 1 tablet (25 mg total) by mouth daily. 30 tablet 1  . SUMAtriptan (IMITREX) 50 MG tablet Take 1 tablet (50 mg total) by mouth every 2 (two) hours as needed for migraine. May repeat in 2 hours if headache persists or recurs. 10 tablet 0  . traMADol (ULTRAM) 50 MG tablet Take 50 mg by mouth every 8 (eight) hours as needed. Reported on 09/16/2015  0  . triamcinolone cream (KENALOG) 0.1 % Apply 1 application topically 2 (two) times daily. 30 g 0   No current facility-administered medications for this visit.    Allergies  Allergen Reactions  . Omeprazole Shortness Of Breath  . Penicillins Shortness Of Breath  . Shellfish Allergy Shortness Of Breath  . Soap Itching    ANYTHING WITH FRAGRANCE    Family History  Problem Relation Age of Onset  . Arthritis Mother   . Ovarian cancer Mother   . Hyperlipidemia Mother   . Hypertension Mother   . Arthritis Father   . Hypertension Father   . Asthma Father   . Asthma Sister   . Arthritis Paternal Grandmother   .  Irritable bowel syndrome Mother     Social History   Social History  . Marital Status: Divorced    Spouse Name: N/A  . Number of Children: 0  . Years of Education: N/A   Occupational History  . CNA/ CPT    Social History Main Topics  . Smoking status: Never Smoker   . Smokeless tobacco: Never Used  . Alcohol Use: 0.0 oz/week    0 Standard drinks or equivalent per week     Comment: occasionally  . Drug Use: No  . Sexual Activity: Yes   Other Topics Concern  . Not on file   Social History Narrative     Constitutional: Pt reports headache. Denies fever, malaise, fatigue, or abrupt weight changes.  HEENT: Denies eye pain, eye redness, ear pain, ringing in the ears, wax buildup, runny nose, nasal  congestion, bloody nose, or sore throat. Respiratory: Denies difficulty breathing, shortness of breath, cough or sputum production.   Cardiovascular: Denies chest pain, chest tightness, palpitations or swelling in the hands or feet.  Musculoskeletal: Denies decrease in range of motion, difficulty with gait, muscle pain or joint pain and swelling.  Skin: Denies redness, rashes, lesions or ulcercations.  Neurological: Pt reports tremor. Denies dizziness, difficulty with memory, difficulty with speech or problems with balance and coordination.  Psych: {t reports stress and anxiety. Denies depression, SI/HI.  No other specific complaints in a complete review of systems (except as listed in HPI above).     Objective:   Physical Exam  BP 124/82 mmHg  Pulse 93  Temp(Src) 98.5 F (36.9 C) (Oral)  Wt 232 lb (105.235 kg)  SpO2 98% Wt Readings from Last 3 Encounters:  11/25/15 232 lb (105.235 kg)  10/24/15 233 lb 8 oz (105.915 kg)  09/16/15 239 lb (108.41 kg)    General: Appears her stated age, obese in NAD. HEENT: Head: normal shape and size; Eyes: sclera white, no icterus, conjunctiva pink, PERRLA and EOMs intact;  Cardiovascular: Normal rate and rhythm. S1,S2 noted.  No murmur, rubs or gallops noted.  Pulmonary/Chest: Normal effort and positive vesicular breath sounds. No respiratory distress. No wheezes, rales or ronchi noted.  Neurological: Alert and oriented. Fine motor skills intact. Psych: She is anxious appearing.   BMET    Component Value Date/Time   NA 136 04/01/2015 2120   NA 137 01/02/2014 1152   K 3.2* 04/01/2015 2120   K 3.9 01/02/2014 1152   CL 104 04/01/2015 2120   CL 102 01/02/2014 1152   CO2 24 04/01/2015 2120   CO2 26 01/02/2014 1152   GLUCOSE 116* 04/01/2015 2120   GLUCOSE 105* 01/02/2014 1152   BUN <5* 04/01/2015 2120   BUN 9 01/02/2014 1152   CREATININE 0.82 04/01/2015 2120   CREATININE 0.81 01/02/2014 1152   CALCIUM 9.0 04/01/2015 2120   CALCIUM 8.8  01/02/2014 1152   GFRNONAA >60 04/01/2015 2120   GFRNONAA >60 01/02/2014 1152   GFRAA >60 04/01/2015 2120   GFRAA >60 01/02/2014 1152    Lipid Panel  No results found for: CHOL, TRIG, HDL, CHOLHDL, VLDL, LDLCALC  CBC    Component Value Date/Time   WBC 11.9* 04/01/2015 2120   WBC 9.5 01/02/2014 1152   RBC 3.99 04/01/2015 2120   RBC 4.86 01/02/2014 1152   HGB 11.8* 04/01/2015 2120   HGB 14.5 01/02/2014 1152   HCT 35.4* 04/01/2015 2120   HCT 43.6 01/02/2014 1152   PLT 245 04/01/2015 2120   PLT 280 01/02/2014 1152  MCV 88.7 04/01/2015 2120   MCV 90 01/02/2014 1152   MCH 29.6 04/01/2015 2120   MCH 29.9 01/02/2014 1152   MCHC 33.3 04/01/2015 2120   MCHC 33.3 01/02/2014 1152   RDW 13.0 04/01/2015 2120   RDW 12.7 01/02/2014 1152   LYMPHSABS 3.5 04/01/2014 2234   LYMPHSABS 2.4 01/02/2014 1152   MONOABS 1.0 04/01/2014 2234   MONOABS 0.7 01/02/2014 1152   EOSABS 0.4 04/01/2014 2234   EOSABS 0.4 01/02/2014 1152   BASOSABS 0.0 04/01/2014 2234   BASOSABS 0.1 01/02/2014 1152    Hgb A1C No results found for: HGBA1C       Assessment & Plan:   Migraines:  Discussed relaxation techniques Increase Topamax to 100 mg as needed If persist, consider adding Propanolol  Tremor:  ? R/t anxiety/stress Continue Zoloft- discussed increasing this 50 mg but she wants to hold off for now  RTC as needed or if symptoms persist or worsen

## 2015-12-30 ENCOUNTER — Encounter: Payer: Self-pay | Admitting: Family

## 2015-12-30 ENCOUNTER — Ambulatory Visit (INDEPENDENT_AMBULATORY_CARE_PROVIDER_SITE_OTHER): Payer: PRIVATE HEALTH INSURANCE | Admitting: Family

## 2015-12-30 VITALS — BP 120/90 | HR 97 | Temp 97.8°F | Ht 65.0 in | Wt 213.0 lb

## 2015-12-30 DIAGNOSIS — J019 Acute sinusitis, unspecified: Secondary | ICD-10-CM | POA: Diagnosis not present

## 2015-12-30 MED ORDER — DOXYCYCLINE HYCLATE 100 MG PO TABS: 100.0000 mg | ORAL_TABLET | Freq: Two times a day (BID) | ORAL | Status: DC

## 2015-12-30 NOTE — Progress Notes (Signed)
Pre visit review using our clinic review tool, if applicable. No additional management support is needed unless otherwise documented below in the visit note. 

## 2015-12-30 NOTE — Patient Instructions (Signed)
As we discussed, please remain very vigilant regarding symptoms. Please follow up with dentist early next week to ensure this is not a complication of recent dental work.   Increase intake of clear fluids. Congestion is best treated by hydration, when mucus is wetter, it is thinner, less sticky, and easier to expel from the body, either through coughing up drainage, or by blowing your nose.   Get plenty of rest.   Use saline nasal drops and blow your nose frequently. Run a humidifier at night and elevate the head of the bed. Vicks Vapor rub will help with congestion and cough. Steam showers and sinus massage for congestion.   Use Acetaminophen or Ibuprofen as needed for fever or pain. Avoid second hand smoke. Even the smallest exposure will worsen symptoms.   Over the counter medications you can try include Delsym for cough, a decongestant for congestion, and Mucinex or Robitussin as an expectorant. Be sure to just get the plain Mucinex or Robitussin that just has one medication (Guaifenesen). We don't recommend the combination products. Note, be sure to drink two glasses of water with each dose of Mucinex as the medication will not work well without adequate hydration.   You can also try a teaspoon of honey to see if this will help reduce cough. Throat lozenges can sometimes be beneficial as well.    This illness will typically last 7 - 10 days.   Please follow up with our clinic if you develop a fever greater than 101 F, symptoms worsen, or do not resolve in the next week.

## 2015-12-30 NOTE — Progress Notes (Signed)
Subjective:    Patient ID: Mikayla Briggs, female    DOB: 1981/01/31, 35 y.o.   MRN: [id number]   Mikayla Briggs is a 35 y.o. female who presents today for an acute visit.    HPI Comments: Patient presents for bilateral ear pain radiating to temporal region, sudden onset yesterday. Swelling and tenderness over maxillary sinuses.  Mild headaches on right side yesterday over temporal bone. Tyelonol eased the pain. Pain was severe last night and has moved from right side to both sides now. Left ear pain worse than right. Reports dental work for two cavities 'one on each side of top of mouth' one week ago where they numbed her mouth.  Denies fever, chills, severe HA, changes vision or hearing.      Otalgia  There is pain in both ears. Pertinent negatives include no coughing, headaches, rash, sore throat or vomiting.   Past Medical History  Diagnosis Date  . Migraines   . Seasonal allergies   . 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 PUD (peptic ulcer disease)    Omeprazole; Penicillins; Shellfish allergy; and Soap Current Outpatient Prescriptions on File Prior to Visit  Medication Sig Dispense Refill  . cyclobenzaprine (FLEXERIL) 5 MG tablet Take 1 tablet (5 mg total) by mouth 3 (three) times daily as needed for muscle spasms. 30 tablet 0  . fexofenadine (ALLEGRA) 180 MG tablet Take 1 tablet (180 mg total) by mouth 2 (two) times daily. 60 tablet 4  . levonorgestrel-ethinyl estradiol (SEASONALE,INTROVALE,JOLESSA) 0.15-0.03 MG tablet Take 1 tablet by mouth daily.    . montelukast (SINGULAIR) 10 MG tablet TAKE ONE TABLET BY MOUTH AT BEDTIME 30 tablet 6  . sertraline (ZOLOFT) 25 MG tablet Take 1 tablet (25 mg total) by mouth daily. 30 tablet 1  . SUMAtriptan (IMITREX) 100 MG tablet Take 1 tablet (100 mg total) by mouth every 2 (two) hours as needed for  migraine. May repeat in 2 hours if headache persists or recurs. 10 tablet 0  . traMADol (ULTRAM) 50 MG tablet Take 50 mg by mouth every 8 (eight) hours as needed. Reported on 09/16/2015  0  . triamcinolone cream (KENALOG) 0.1 % Apply 1 application topically 2 (two) times daily. 30 g 0   No current facility-administered medications on file prior to visit.    Social History  Substance Use Topics  . Smoking status: Never Smoker   . Smokeless tobacco: Never Used  . Alcohol Use: 0.0 oz/week    0 Standard drinks or equivalent per week     Comment: occasionally    Review of Systems  Constitutional: Negative for fever, chills and unexpected weight change.  HENT: Positive for ear pain. Negative for congestion, sinus pressure and sore throat.   Eyes: Negative for visual disturbance.  Respiratory: Negative for cough, shortness of breath and wheezing.   Cardiovascular: Negative for chest pain, palpitations and leg swelling.  Gastrointestinal: Negative for nausea and vomiting.  Musculoskeletal: Negative for myalgias and arthralgias.  Skin: Negative for rash.  Neurological: Negative for headaches.  Hematological: Negative for adenopathy.      Objective:    BP 120/90 mmHg  Pulse 97  Temp(Src) 97.8 F (36.6 C) (Oral)  Ht 5\' 5"  (1.651 m)  Wt 213 lb (96.616 kg)  BMI 35.44 kg/m2  SpO2 97%   Physical Exam  Constitutional: She  appears well-developed and well-nourished.  HENT:  Head: Normocephalic and atraumatic.  Right Ear: Hearing, external ear and ear canal normal. No drainage, swelling or tenderness. No foreign bodies. Tympanic membrane is bulging. Tympanic membrane is not erythematous. No middle ear effusion. No decreased hearing is noted.  Left Ear: Hearing, external ear and ear canal normal. No drainage, swelling or tenderness. No foreign bodies. Tympanic membrane is bulging. Tympanic membrane is not erythematous.  No middle ear effusion. No decreased hearing is noted.  Nose: No  rhinorrhea. Right sinus exhibits maxillary sinus tenderness. Right sinus exhibits no frontal sinus tenderness. Left sinus exhibits no maxillary sinus tenderness and no frontal sinus tenderness.  Mouth/Throat: Uvula is midline, oropharynx is clear and moist and mucous membranes are normal. No oral lesions. No trismus in the jaw. No dental abscesses or uvula swelling. No oropharyngeal exudate, posterior oropharyngeal edema, posterior oropharyngeal erythema or tonsillar abscesses.  Inspected oral mucosa with no tenderness, no lesions or swelling. Fillings appear to be intact from recent dental work.   Eyes: Conjunctivae are normal.  Cardiovascular: Regular rhythm, normal heart sounds and normal pulses.   Pulmonary/Chest: Effort normal and breath sounds normal. She has no wheezes. She has no rhonchi. She has no rales.  Lymphadenopathy:       Head (right side): No submental, no submandibular, no tonsillar, no preauricular, no posterior auricular and no occipital adenopathy present.       Head (left side): No submental, no submandibular, no tonsillar, no preauricular, no posterior auricular and no occipital adenopathy present.    She has no cervical adenopathy.  Neurological: She is alert.  Skin: Skin is warm and dry.  Psychiatric: She has a normal mood and affect. Her speech is normal and behavior is normal. Thought content normal.  Vitals reviewed.      Assessment & Plan:   1. Acute sinusitis, recurrence not specified, unspecified location Patient has severe penicillin allergy, covered with doxycycline due to severity of symptoms and unilateral worse maxillary tenderness on right. Reassured by normal oral exam with low clinical suspicion of any dental abscess causing symptoms.   - doxycycline (VIBRA-TABS) 100 MG tablet; Take 1 tablet (100 mg total) by mouth 2 (two) times daily.  Dispense: 14 tablet; Refill: 0    I am having Mikayla Briggs start on doxycycline. I am also having her maintain her  fexofenadine, levonorgestrel-ethinyl estradiol, traMADol, cyclobenzaprine, triamcinolone cream, montelukast, sertraline, and SUMAtriptan.   Start medications as prescribed and explained to patient on After Visit Summary ( AVS). Risks, benefits, and alternatives of the medications and treatment plan prescribed today were discussed, and patient expressed understanding.   Education regarding symptom management and diagnosis given to patient.   Follow-up:Plan follow-up as discussed or as needed if any worsening symptoms or change in condition.  Continue to follow with Nicki Reaper, NP for routine health maintenance.   Mikayla Abbot and I agreed with plan.   Rennie Plowman, FNP

## 2016-01-07 ENCOUNTER — Ambulatory Visit (INDEPENDENT_AMBULATORY_CARE_PROVIDER_SITE_OTHER): Payer: PRIVATE HEALTH INSURANCE | Admitting: Family Medicine

## 2016-01-07 ENCOUNTER — Encounter: Payer: Self-pay | Admitting: Family Medicine

## 2016-01-07 VITALS — BP 120/76 | HR 79 | Temp 98.3°F | Wt 235.1 lb

## 2016-01-07 DIAGNOSIS — Z9109 Other allergy status, other than to drugs and biological substances: Secondary | ICD-10-CM | POA: Insufficient documentation

## 2016-01-07 DIAGNOSIS — Z91048 Other nonmedicinal substance allergy status: Secondary | ICD-10-CM | POA: Diagnosis not present

## 2016-01-07 MED ORDER — METHYLPREDNISOLONE ACETATE 40 MG/ML IJ SUSP
40.0000 mg | Freq: Once | INTRAMUSCULAR | Status: AC
  Administered 2016-01-07: 40 mg via INTRAMUSCULAR

## 2016-01-07 MED ORDER — PREDNISONE 10 MG (21) PO TBPK: 10.0000 mg | ORAL_TABLET | Freq: Every day | ORAL | Status: DC

## 2016-01-07 NOTE — Patient Instructions (Signed)
Continue your current medications.  Start the steroids tomorrow.  Follow up closely with your primary.  Take care  Dr. Adriana Simasook

## 2016-01-07 NOTE — Progress Notes (Signed)
Subjective:  Patient ID: Mikayla Briggs, female    DOB: 08-28-1981  Age: 35 y.o. MRN: [id number]  CC: Sneezing, eye itching/swelling, ear pain  HPI:  35 year old female presents with the above complaints.  Patient states that she was recently seen on 4/18 and was diagnosed and treated for acute sinusitis. She states that she has recently developed worsening allergy symptoms. She's experiencing severe/frequent sneezing, pressure in the ears, and eye itching and periorbital swelling. She's been taking her Allegra and Singulair with no improvement. No known exacerbating or relieving factors. No associated fever or chills. She continues to have some nasal congestion/sinus tenderness. No other complaints this time.  Social Hx   Social History   Social History  . Marital Status: Divorced    Spouse Name: N/A  . Number of Children: 0  . Years of Education: N/A   Occupational History  . CNA/ CPT    Social History Main Topics  . Smoking status: Never Smoker   . Smokeless tobacco: Never Used  . Alcohol Use: 0.0 oz/week    0 Standard drinks or equivalent per week     Comment: occasionally  . Drug Use: No  . Sexual Activity: Yes   Other Topics Concern  . None   Social History Narrative   Review of Systems  Constitutional: Negative.   HENT: Positive for congestion, sinus pressure and sneezing.   Eyes: Positive for itching.   Objective:  BP 120/76 mmHg  Pulse 79  Temp(Src) 98.3 F (36.8 C) (Oral)  Wt 235 lb 2 oz (106.652 kg)  SpO2 98%  BP/Weight 01/07/2016 12/30/2015 11/25/2015  Systolic BP 120 120 124  Diastolic BP 76 90 82  Wt. (Lbs) 235.13 213 232  BMI 39.13 35.44 38.61   Physical Exam  Constitutional: She is oriented to person, place, and time. She appears well-developed. No distress.  HENT:  Head: Normocephalic and atraumatic.  Mouth/Throat: Oropharynx is clear and moist.  Normal TM's bilaterally.  Eyes: Conjunctivae are normal. Right eye exhibits no discharge.  Left eye exhibits no discharge.  Cardiovascular: Normal rate and regular rhythm.   Pulmonary/Chest: Effort normal and breath sounds normal.  Neurological: She is alert and oriented to person, place, and time.  Psychiatric: She has a normal mood and affect.  Vitals reviewed.  Lab Results  Component Value Date   WBC 11.9* 04/01/2015   HGB 11.8* 04/01/2015   HCT 35.4* 04/01/2015   PLT 245 04/01/2015   GLUCOSE 116* 04/01/2015   ALT 17 03/12/2015   AST 24 03/12/2015   NA 136 04/01/2015   K 3.2* 04/01/2015   CL 104 04/01/2015   CREATININE 0.82 04/01/2015   BUN <5* 04/01/2015   CO2 24 04/01/2015   Assessment & Plan:   Problem List Items Addressed This Visit    Environmental allergies - Primary    Patient with significant allergic symptoms. She states that this happens intermittently and typically requires corticosteroids. I have seen her for a similar presentation previously. Per patient preference, treating with prednisone. IM Depo-Medrol was given today as well.       Relevant Medications   methylPREDNISolone acetate (DEPO-MEDROL) injection 40 mg (Completed) (Start on 01/07/2016  1:45 PM)      Meds ordered this encounter  Medications  . predniSONE (STERAPRED UNI-PAK 21 TAB) 10 MG (21) TBPK tablet    Sig: Take 1 tablet (10 mg total) by mouth daily. Take per package instructions.    Dispense:  21 tablet    Refill:  0  . methylPREDNISolone acetate (DEPO-MEDROL) injection 40 mg    Sig:     Follow-up: PRN  Everlene Other DO [facility]

## 2016-01-07 NOTE — Assessment & Plan Note (Signed)
Patient with significant allergic symptoms. She states that this happens intermittently and typically requires corticosteroids. I have seen her for a similar presentation previously. Per patient preference, treating with prednisone. IM Depo-Medrol was given today as well.

## 2016-01-07 NOTE — Progress Notes (Signed)
Pre visit review using our clinic review tool, if applicable. No additional management support is needed unless otherwise documented below in the visit note. 

## 2016-01-17 ENCOUNTER — Encounter: Payer: Self-pay | Admitting: Emergency Medicine

## 2016-01-17 ENCOUNTER — Emergency Department
Admission: EM | Admit: 2016-01-17 | Discharge: 2016-01-17 | Disposition: A | Discharge status: Home / Self Care | Payer: PRIVATE HEALTH INSURANCE | Attending: Emergency Medicine | Admitting: Emergency Medicine

## 2016-01-17 DIAGNOSIS — Z91013 Allergy to seafood: Secondary | ICD-10-CM | POA: Insufficient documentation

## 2016-01-17 DIAGNOSIS — Z79899 Other long term (current) drug therapy: Secondary | ICD-10-CM | POA: Diagnosis not present

## 2016-01-17 DIAGNOSIS — G43009 Migraine without aura, not intractable, without status migrainosus: Secondary | ICD-10-CM | POA: Diagnosis present

## 2016-01-17 MED ORDER — DIPHENHYDRAMINE HCL 50 MG/ML IJ SOLN
50.0000 mg | Freq: Once | INTRAMUSCULAR | Status: AC
  Administered 2016-01-17: 50 mg via INTRAMUSCULAR
  Filled 2016-01-17: qty 1

## 2016-01-17 MED ORDER — PROCHLORPERAZINE EDISYLATE 5 MG/ML IJ SOLN
10.0000 mg | Freq: Once | INTRAMUSCULAR | Status: AC
  Administered 2016-01-17: 10 mg via INTRAMUSCULAR
  Filled 2016-01-17: qty 2

## 2016-01-17 MED ORDER — KETOROLAC TROMETHAMINE 60 MG/2ML IM SOLN
60.0000 mg | Freq: Once | INTRAMUSCULAR | Status: AC
  Administered 2016-01-17: 60 mg via INTRAMUSCULAR
  Filled 2016-01-17: qty 2

## 2016-01-17 NOTE — ED Notes (Signed)
Pt c/o typical migraine headache since 4pm today; took Imitrex at 1930 with little relief; pt c/o nausea; denies sensitivity to lights and sounds; pt ambulatory with slow steady gait;

## 2016-01-17 NOTE — ED Notes (Signed)
Pt c/o migraine, sts that she has had migraines before and feels typical.  Sts that she took her home medication w/ no relief.  Denies vision chgs, light sensitivity.  C/o nausea denies vomiting

## 2016-01-17 NOTE — ED Provider Notes (Signed)
[facility] Emergency Department Provider Note  ____________________________________________  Time seen: Approximately 9:53 PM  I have reviewed the triage vital signs and the nursing notes.   HISTORY  Chief Complaint Migraine    HPI Mikayla Briggs is a 35 y.o. female who presents emergency department complaining of migraine headache. Patient states that she has a history of recurring migraines and that the symptoms are exactly the same as previous migraines. Patient denies any visual acuity changes but does report some mild photophobia. Patient denies any vomiting, neck pain, chest pain, shortness of breath. Patient has used Toradol Phenergan, low in the past. We are advising her that Phenergan is Sport and exercise psychologist and unavailable. Patient consents to Toradol, Compazine, diphenhydramine combination.   Past Medical History  Diagnosis Date  . Migraines   . Seasonal allergies   . 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 Environmental allergies 01/07/2016  . Anxiety 10/24/2015  . Tremor 10/24/2015  . Severe obesity (BMI >= 40) (HCC) 04/10/2014    Past Surgical History  Procedure Laterality Date  . Wisdom tooth extraction    . Tonsillectomy and adenoidectomy    . Cholecystectomy  11/2003  . Knee arthroscopy Left 2000  . Knee arthroscopy with medial menisectomy Right 10/10/2014    Procedure: RIGHT KNEE ARTHROSCOPY; RESECTION PLICA; CHONDROPLASTY, AND PARTIAL MEDIAL MENISCECTOMY;  Surgeon: Loreta Ave, MD;  Location: [facility];  Service: Orthopedics;  Laterality: Right;    Current Outpatient Rx  Name  Route  Sig  Dispense  Refill  . cyclobenzaprine (FLEXERIL) 5 MG tablet   Oral   Take 1 tablet (5 mg total) by mouth 3 (three) times daily as needed  for muscle spasms.   30 tablet   0   . doxycycline (VIBRA-TABS) 100 MG tablet   Oral   Take 1 tablet (100 mg total) by mouth 2 (two) times daily. Patient not taking: Reported on 01/07/2016   14 tablet   0   . fexofenadine (ALLEGRA) 180 MG tablet   Oral   Take 1 tablet (180 mg total) by mouth 2 (two) times daily.   60 tablet   4   . levonorgestrel-ethinyl estradiol (SEASONALE,INTROVALE,JOLESSA) 0.15-0.03 MG tablet   Oral   Take 1 tablet by mouth daily.         . montelukast (SINGULAIR) 10 MG tablet      TAKE ONE TABLET BY MOUTH AT BEDTIME   30 tablet   6   . predniSONE (STERAPRED UNI-PAK 21 TAB) 10 MG (21) TBPK tablet   Oral   Take 1 tablet (10 mg total) by mouth daily. Take per package instructions.   21 tablet   0   . sertraline (ZOLOFT) 25 MG tablet   Oral   Take 1 tablet (25 mg total) by mouth daily.   30 tablet   1   . SUMAtriptan (IMITREX) 100 MG tablet   Oral   Take 1 tablet (100 mg total) by mouth every 2 (two) hours as needed for migraine. May repeat in 2 hours if headache persists or recurs.   10 tablet   0   . traMADol (ULTRAM) 50 MG tablet   Oral   Take 50 mg by mouth every 8 (eight) hours as needed. Reported on  09/16/2015      0   . triamcinolone cream (KENALOG) 0.1 %   Topical   Apply 1 application topically 2 (two) times daily.   30 g   0     Allergies Omeprazole; Penicillins; Shellfish allergy; and Soap  Family History  Problem Relation Age of Onset  . Arthritis Mother   . Ovarian cancer Mother   . Hyperlipidemia Mother   . Hypertension Mother   . Arthritis Father   . Hypertension Father   . Asthma Father   . Asthma Sister   . Arthritis Paternal Grandmother   . Irritable bowel syndrome Mother     Social History Social History  Substance Use Topics  . Smoking status: Never Smoker   . Smokeless tobacco: Never Used  . Alcohol Use: 0.0 oz/week    0 Standard drinks or equivalent per week     Comment: occasionally      Review of Systems  Constitutional: No fever/chills Eyes: No visual changes. No discharge ENT: No upper respiratory complaints. Cardiovascular: no chest pain. Respiratory: no cough. No SOB. Musculoskeletal: Negative for musculoskeletal pain. Skin: Negative for rash, abrasions, lacerations, ecchymosis. Neurological: Positive for migraine headache but denies focal weakness or numbness. 10-point ROS otherwise negative.  ____________________________________________   PHYSICAL EXAM:  VITAL SIGNS: ED Triage Vitals  Enc Vitals Group     BP 01/17/16 2117 141/87 mmHg     Pulse Rate 01/17/16 2117 70     Resp 01/17/16 2117 18     Temp 01/17/16 2117 97.4 F (36.3 C)     Temp Source 01/17/16 2117 Oral     SpO2 01/17/16 2117 99 %     Weight 01/17/16 2117 230 lb (104.327 kg)     Height 01/17/16 2117 5\' 7"  (1.702 m)     Head Cir --      Peak Flow --      Pain Score 01/17/16 2117 10     Pain Loc --      Pain Edu? --      Excl. in GC? --      Constitutional: Alert and oriented. Well appearing and in no acute distress. Eyes: Conjunctivae are normal. PERRL. EOMI. Head: Atraumatic. Cardiovascular: Normal rate, regular rhythm. Normal S1 and S2.  Good peripheral circulation. Respiratory: Normal respiratory effort without tachypnea or retractions. Lungs CTAB. Good air entry to the bases with no decreased or absent breath sounds. Musculoskeletal: Full range of motion to all extremities. No gross deformities appreciated. Neurologic:  Normal speech and language. No gross focal neurologic deficits are appreciated.  Skin:  Skin is warm, dry and intact. No rash noted. Psychiatric: Mood and affect are normal. Speech and behavior are normal. Patient exhibits appropriate insight and judgement.   ____________________________________________   LABS (all labs ordered are listed, but only abnormal results are displayed)  Labs Reviewed - No data to  display ____________________________________________  EKG   ____________________________________________  RADIOLOGY   No results found.  ____________________________________________    PROCEDURES  Procedure(s) performed:       Medications  ketorolac (TORADOL) injection 60 mg (60 mg Intramuscular Given 01/17/16 2217)  prochlorperazine (COMPAZINE) injection 10 mg (10 mg Intramuscular Given 01/17/16 2217)  diphenhydrAMINE (BENADRYL) injection 50 mg (50 mg Intramuscular Given 01/17/16 2217)     ____________________________________________   INITIAL IMPRESSION / ASSESSMENT AND PLAN / ED COURSE  Pertinent labs & imaging results that were available during my care of the patient were reviewed by me and considered in my  medical decision making (see chart for details).  Patient's diagnosis is consistent with migraine headache. Patient denies any changes from baseline and no imaging is ordered. Patient is given Toradol, Compazine, diphenhydramine with good symptom control..  Patient is to follow up with primary care provider as needed or otherwise directed. Patient is given ED precautions to return to the ED for any worsening or new symptoms.     ____________________________________________  FINAL CLINICAL IMPRESSION(S) / ED DIAGNOSES  Final diagnoses:  Migraine without aura and without status migrainosus, not intractable      NEW MEDICATIONS STARTED DURING THIS VISIT:  New Prescriptions   No medications on file        This chart was dictated using voice recognition software/Dragon. Despite best efforts to proofread, errors can occur which can change the meaning. Any change was purely unintentional.    Racheal Patches, PA-C 01/17/16 2218  Emily Filbert, MD 01/17/16 [phone]

## 2016-01-17 NOTE — Discharge Instructions (Signed)
Recurrent Migraine Headache A migraine headache is an intense, throbbing pain on one or both sides of your head. Recurrent migraines keep coming back. A migraine can last for 30 minutes to several hours. CAUSES  The exact cause of a migraine headache is not always known. However, a migraine may be caused when nerves in the brain become irritated and release chemicals that cause inflammation. This causes pain. Certain things may also trigger migraines, such as:   Alcohol.  Smoking.  Stress.  Menstruation.  Aged cheeses.  Foods or drinks that contain nitrates, glutamate, aspartame, or tyramine.  Lack of sleep.  Chocolate.  Caffeine.  Hunger.  Physical exertion.  Fatigue.  Medicines used to treat chest pain (nitroglycerine), birth control pills, estrogen, and some blood pressure medicines. SYMPTOMS   Pain on one or both sides of your head.  Pulsating or throbbing pain.  Severe pain that prevents daily activities.  Pain that is aggravated by any physical activity.  Nausea, vomiting, or both.  Dizziness.  Pain with exposure to bright lights, loud noises, or activity.  General sensitivity to bright lights, loud noises, or smells. Before you get a migraine, you may get warning signs that a migraine is coming (aura). An aura may include:  Seeing flashing lights.  Seeing bright spots, halos, or zigzag lines.  Having tunnel vision or blurred vision.  Having feelings of numbness or tingling.  Having trouble talking.  Having muscle weakness. DIAGNOSIS  A recurrent migraine headache is often diagnosed based on:  Symptoms.  Physical examination.  A CT scan or MRI of your head. These imaging tests cannot diagnose migraines but can help rule out other causes of headaches.  TREATMENT  Medicines may be given for pain and nausea. Medicines can also be given to help prevent recurrent migraines. HOME CARE INSTRUCTIONS  Only take over-the-counter or prescription  medicines for pain or discomfort as directed by your health care provider. The use of long-term narcotics is not recommended.  Lie down in a dark, quiet room when you have a migraine.  Keep a journal to find out what may trigger your migraine headaches. For example, write down:  What you eat and drink.  How much sleep you get.  Any change to your diet or medicines.  Limit alcohol consumption.  Quit smoking if you smoke.  Get 7-9 hours of sleep, or as recommended by your health care provider.  Limit stress.  Keep lights dim if bright lights bother you and make your migraines worse. SEEK MEDICAL CARE IF:   You do not get relief from the medicines given to you.  You have a recurrence of pain.  You have a fever. SEEK IMMEDIATE MEDICAL CARE IF:  Your migraine becomes severe.  You have a stiff neck.  You have loss of vision.  You have muscular weakness or loss of muscle control.  You start losing your balance or have trouble walking.  You feel faint or pass out.  You have severe symptoms that are different from your first symptoms. MAKE SURE YOU:   Understand these instructions.  Will watch your condition.  Will get help right away if you are not doing well or get worse.   This information is not intended to replace advice given to you by your health care provider. Make sure you discuss any questions you have with your health care provider.   Document Released: 05/25/2001 Document Revised: 09/20/2014 Document Reviewed: 05/07/2013 Elsevier Interactive Patient Education 2016 Elsevier Inc.  

## 2016-02-18 ENCOUNTER — Ambulatory Visit (INDEPENDENT_AMBULATORY_CARE_PROVIDER_SITE_OTHER): Payer: PRIVATE HEALTH INSURANCE | Admitting: Internal Medicine

## 2016-02-18 ENCOUNTER — Encounter: Payer: Self-pay | Admitting: Internal Medicine

## 2016-02-18 VITALS — BP 124/82 | HR 91 | Temp 98.3°F | Wt 240.0 lb

## 2016-02-18 DIAGNOSIS — B370 Candidal stomatitis: Secondary | ICD-10-CM | POA: Diagnosis not present

## 2016-02-18 DIAGNOSIS — J309 Allergic rhinitis, unspecified: Secondary | ICD-10-CM | POA: Diagnosis not present

## 2016-02-18 MED ORDER — NYSTATIN 100000 UNIT/ML MT SUSP: 5.0000 mL | Freq: Four times a day (QID) | OROMUCOSAL | Status: DC

## 2016-02-18 NOTE — Progress Notes (Signed)
HPI  Pt presents to the clinic today with c/o headache, facial pain and pressure, runny nose and sore throat. This started yesterday. She is blowing clear mucous out of her nose. She denies difficulty swallowing. She denies fever, chills or body aches. She has tried Sudafed, Careers adviserAllegra, Singulair and Benadryl without any relief. She has a history of seasonal allergies. She has not had sick contacts.  Review of Systems    Past Medical History  Diagnosis Date  . Migraines   . Seasonal allergies   . 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 Arthritis Mother   . Ovarian cancer Mother   . Hyperlipidemia Mother   . Hypertension Mother   . Arthritis Father   . Hypertension Father   . Asthma Father   . Asthma Sister   . Arthritis Paternal Grandmother   . Irritable bowel syndrome Mother     Social History   Social History  . Marital Status: Married    Spouse Name: N/A  . Number of Children: 0  . Years of Education: N/A   Occupational History  . CNA/ CPT    Social History Main Topics  . Smoking status: Never Smoker   . Smokeless tobacco: Never Used  . Alcohol Use: 0.0 oz/week    0 Standard drinks or equivalent per week     Comment: occasionally  . Drug Use: No  . Sexual Activity: Yes   Other Topics Concern  . Not on file   Social History Narrative    Allergies  Allergen Reactions  . Omeprazole Shortness Of Breath  . Penicillins Shortness Of Breath  . Shellfish Allergy Shortness Of Breath  . Soap Itching    ANYTHING WITH FRAGRANCE     Constitutional: Positive headache. Denies fatigue, fever or abrupt weight changes.  HEENT:  Positive facial pain, runny nose and sore throat. Denies eye redness, ear pain, ringing in the ears, wax buildup, nasal congestion or bloody nose. Respiratory:   Denies cough, difficulty breathing or shortness of breath.  Cardiovascular: Denies chest pain, chest tightness, palpitations or swelling in the hands or feet.   No other specific complaints in a complete review of systems (except as listed in HPI above).  Objective:  BP 124/82 mmHg  Pulse 91  Temp(Src) 98.3 F (36.8 C) (Oral)  Wt 240 lb (108.863 kg)  SpO2 98%   General: Appears her stated age, in NAD. HEENT: Head: normal shape and size, no sinus tenderness noted; Eyes: sclera white, no icterus, conjunctiva pink; Ears: Tm's gray and intact, normal light reflex; Nose: mucosa boggy and moist, septum midline; Throat/Mouth: + PND. Teeth present, mucosa pink and moist, no exudate noted, no lesions or ulcerations noted. Scattered white patches noted on tongue. Neck:  No adenopathy noted.  Cardiovascular: Normal rate and rhythm. S1,S2 noted.  No murmur, rubs or gallops noted.  Pulmonary/Chest: Normal effort and positive vesicular breath sounds. No respiratory distress. No wheezes, rales or ronchi noted.      Assessment & Plan:   Allergic Rhinitis  Can use a Neti Pot which can be purchased from your local drug store. Flonase 2 sprays each nostril for 3 days and then as needed. Continue Allegra and Singulair 80 mg Depo IM today Return precautions given  Oral thrush:  eRx for Nystatin swish  and swallow x 5 days  RTC as needed or if symptoms persist.

## 2016-02-18 NOTE — Patient Instructions (Signed)
Thrush, Adult  Thrush, also called oral candidiasis, is a fungal infection that develops in the mouth and throat and on the tongue. It causes white patches to form on the mouth and tongue. Thrush is most common in older adults, but it can occur at any age.   Many cases of thrush are mild, but this infection can also be more serious. Thrush can be a recurring problem for people who have chronic illnesses or who take medicines that limit the body's ability to fight infection. Because these people have difficulty fighting infections, the fungus that causes thrush can spread throughout the body. This can cause life-threatening blood or organ infections.  CAUSES   Thrush is usually caused by a yeast called Candida albicans. This fungus is normally present in small amounts in the mouth and on other mucous membranes. It usually causes no harm. However, when conditions are present that allow the fungus to grow uncontrolled, it invades surrounding tissues and becomes an infection. Less often, other Candida species can also lead to thrush.   RISK FACTORS  Thrush is more likely to develop in the following people:  · People with an impaired ability to fight infection (weakened immune system).    · Older adults.    · People with HIV.    · People with diabetes.    · People with dry mouth (xerostomia).    · Pregnant women.    · People with poor dental care, especially those who have false teeth.    · People who use antibiotic medicines.    SIGNS AND SYMPTOMS   Thrush can be a mild infection that causes no symptoms. If symptoms develop, they may include:   · A burning feeling in the mouth and throat. This can occur at the start of a thrush infection.    · White patches that adhere to the mouth and tongue. The tissue around the patches may be red, raw, and painful. If rubbed (during tooth brushing, for example), the patches and the tissue of the mouth may bleed easily.    · A bad taste in the mouth or difficulty tasting foods.     · Cottony feeling in the mouth.    · Pain during eating and swallowing.  DIAGNOSIS   Your health care provider can usually diagnose thrush by looking in your mouth and asking you questions about your health.   TREATMENT   Medicines that help prevent the growth of fungi (antifungals) are the standard treatment for thrush. These medicines are either applied directly to the affected area (topical) or swallowed (oral). The treatment will depend on the severity of the condition.   Mild Thrush  Mild cases of thrush may clear up with the use of an antifungal mouth rinse or lozenges. Treatment usually lasts about 14 days.   Moderate to Severe Thrush  · More severe thrush infections that have spread to the esophagus are treated with an oral antifungal medicine. A topical antifungal medicine may also be used.    · For some severe infections, a treatment period longer than 14 days may be needed.    · Oral antifungal medicines are almost never used during pregnancy because the fetus may be harmed. However, if a pregnant woman has a rare, severe thrush infection that has spread to her blood, oral antifungal medicines may be used. In this case, the risk of harm to the mother and fetus from the severe thrush infection may be greater than the risk posed by the use of antifungal medicines.    Persistent or Recurrent Thrush  For cases of   thrush that do not go away or keep coming back, treatment may involve the following:   · Treatment may be needed twice as long as the symptoms last.    · Treatment will include both oral and topical antifungal medicines.    · People with weakened immune systems can take an antifungal medicine on a continuous basis to prevent thrush infections.    It is important to treat conditions that make you more likely to get thrush, such as diabetes or HIV.   HOME CARE INSTRUCTIONS   · Only take over-the-counter or prescription medicine as directed by your health care provider. Talk to your health care  provider about an over-the-counter medicine called gentian violet, which kills bacteria and fungi.    · Eat plain, unflavored yogurt as directed by your health care provider. Check the label to make sure the yogurt contains live cultures. This yogurt can help healthy bacteria grow in the mouth that can stop the growth of the fungus that causes thrush.    · Try these measures to help reduce the discomfort of thrush:      Drink cold liquids such as water or iced tea.      Try flavored ice treats or frozen juices.      Eat foods that are easy to swallow, such as gelatin, ice cream, or custard.      If the patches in your mouth are painful, try drinking from a straw.    · Rinse your mouth several times a day with a warm saltwater rinse. You can make the saltwater mixture with 1 tsp (6 g) of salt in 8 fl oz (0.2 L) of warm water.    · If you wear dentures, remove the dentures before going to bed, brush them vigorously, and soak them in a cleaning solution as directed by your health care provider.    · Women who are breastfeeding should clean their nipples with an antifungal medicine as directed by their health care provider. Dry the nipples after breastfeeding. Applying lanolin-containing body lotion may help relieve nipple soreness.    SEEK MEDICAL CARE IF:  · Your symptoms are getting worse or are not improving within 7 days of starting treatment.    · You have symptoms of spreading infection, such as white patches on the skin outside of the mouth.    · You are nursing and you have redness, burning, or pain in the nipples that is not relieved with treatment.    MAKE SURE YOU:  · Understand these instructions.  · Will watch your condition.  · Will get help right away if you are not doing well or get worse.     This information is not intended to replace advice given to you by your health care provider. Make sure you discuss any questions you have with your health care provider.     Document Released: 05/25/2004 Document  Revised: 09/20/2014 Document Reviewed: 04/02/2013  Elsevier Interactive Patient Education ©2016 Elsevier Inc.

## 2016-02-18 NOTE — Progress Notes (Signed)
Pre visit review using our clinic review tool, if applicable. No additional management support is needed unless otherwise documented below in the visit note. 

## 2016-04-06 ENCOUNTER — Ambulatory Visit (INDEPENDENT_AMBULATORY_CARE_PROVIDER_SITE_OTHER): Payer: PRIVATE HEALTH INSURANCE | Admitting: Internal Medicine

## 2016-04-06 ENCOUNTER — Encounter: Payer: Self-pay | Admitting: Internal Medicine

## 2016-04-06 VITALS — BP 130/86 | HR 99 | Temp 98.3°F | Wt 239.0 lb

## 2016-04-06 DIAGNOSIS — J309 Allergic rhinitis, unspecified: Secondary | ICD-10-CM | POA: Diagnosis not present

## 2016-04-06 MED ORDER — MOMETASONE FUROATE 50 MCG/ACT NA SUSP: 2.0000 | Freq: Every day | NASAL | 12 refills | Status: DC

## 2016-04-06 MED ORDER — LEVOCETIRIZINE DIHYDROCHLORIDE 5 MG PO TABS: 5.0000 mg | ORAL_TABLET | Freq: Every evening | ORAL | 2 refills | Status: DC

## 2016-04-06 NOTE — Patient Instructions (Signed)
Allergic Rhinitis Allergic rhinitis is when the mucous membranes in the nose respond to allergens. Allergens are particles in the air that cause your body to have an allergic reaction. This causes you to release allergic antibodies. Through a chain of events, these eventually cause you to release histamine into the blood stream. Although meant to protect the body, it is this release of histamine that causes your discomfort, such as frequent sneezing, congestion, and an itchy, runny nose.  CAUSES Seasonal allergic rhinitis (hay fever) is caused by pollen allergens that may come from grasses, trees, and weeds. Year-round allergic rhinitis (perennial allergic rhinitis) is caused by allergens such as house dust mites, pet dander, and mold spores. SYMPTOMS  Nasal stuffiness (congestion).  Itchy, runny nose with sneezing and tearing of the eyes. DIAGNOSIS Your health care provider can help you determine the allergen or allergens that trigger your symptoms. If you and your health care provider are unable to determine the allergen, skin or blood testing may be used. Your health care provider will diagnose your condition after taking your health history and performing a physical exam. Your health care provider may assess you for other related conditions, such as asthma, pink eye, or an ear infection. TREATMENT Allergic rhinitis does not have a cure, but it can be controlled by:  Medicines that block allergy symptoms. These may include allergy shots, nasal sprays, and oral antihistamines.  Avoiding the allergen. Hay fever may often be treated with antihistamines in pill or nasal spray forms. Antihistamines block the effects of histamine. There are over-the-counter medicines that may help with nasal congestion and swelling around the eyes. Check with your health care provider before taking or giving this medicine. If avoiding the allergen or the medicine prescribed do not work, there are many new medicines  your health care provider can prescribe. Stronger medicine may be used if initial measures are ineffective. Desensitizing injections can be used if medicine and avoidance does not work. Desensitization is when a patient is given ongoing shots until the body becomes less sensitive to the allergen. Make sure you follow up with your health care provider if problems continue. HOME CARE INSTRUCTIONS It is not possible to completely avoid allergens, but you can reduce your symptoms by taking steps to limit your exposure to them. It helps to know exactly what you are allergic to so that you can avoid your specific triggers. SEEK MEDICAL CARE IF:  You have a fever.  You develop a cough that does not stop easily (persistent).  You have shortness of breath.  You start wheezing.  Symptoms interfere with normal daily activities.   This information is not intended to replace advice given to you by your health care provider. Make sure you discuss any questions you have with your health care provider.   Document Released: 05/25/2001 Document Revised: 09/20/2014 Document Reviewed: 05/07/2013 Elsevier Interactive Patient Education 2016 Elsevier Inc.  

## 2016-04-06 NOTE — Progress Notes (Signed)
Pre visit review using our clinic review tool, if applicable. No additional management support is needed unless otherwise documented below in the visit note. 

## 2016-04-06 NOTE — Progress Notes (Signed)
Subjective:    Patient ID: Mikayla Briggs, female    DOB: [birth year], 35 y.o.   MRN: [id number]  HPI  Pt presents to the clinic today with a complaint of an allergy "flare" x 2 weeks.  She reports itchy, red, swollen eyes, and clear drainage from the eyes 2 days ago.  She admits to runny and nasal congestion, sinus pressure, ear pressure, and drainage down the back of the throat.  She denies ear drainage, hearing changes, sore throat, fever, headache, or cough.  She takes Allegra and Singulair daily, and has tried Benadryl, Mucinex D, and Tylenol cold and sinus without relief.  She has no known sick contacts.  She reports her allergies have been worsening over the last 10 years.  She used to get allergy shots but they were not effective.   Review of Systems   Past Medical History:  Diagnosis Date  . 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 Migraines   . 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 Seasonal allergies     Current Outpatient Prescriptions  Medication Sig Dispense Refill  . CAMRESE 0.15-0.03 &0.01 MG tablet Take 1 tablet by mouth daily.  3  . montelukast (SINGULAIR) 10 MG tablet TAKE ONE TABLET BY MOUTH AT BEDTIME 30 tablet 6  . sertraline (ZOLOFT) 25 MG tablet Take 1 tablet (25 mg total) by mouth daily. 30 tablet 1  . SUMAtriptan (IMITREX) 100 MG tablet Take 1 tablet (100 mg total) by mouth every 2 (two) hours as needed for migraine. May repeat in 2 hours if headache persists or recurs. 10 tablet 0  . triamcinolone cream (KENALOG) 0.1 % Apply 1 application topically 2 (two) times daily. 30 g 0  . levocetirizine (XYZAL) 5 MG tablet Take 1 tablet (5 mg total) by mouth every evening. 30 tablet 2  . mometasone (NASONEX) 50 MCG/ACT nasal spray Place 2 sprays into the nose daily. 17 g 12   No current facility-administered medications for this visit.     Allergies    Allergen Reactions  . Omeprazole Shortness Of Breath  . Penicillins Shortness Of Breath  . Shellfish Allergy Shortness Of Breath  . Soap Itching    ANYTHING WITH FRAGRANCE    Family History  Problem Relation Age of Onset  . Arthritis Mother   . Ovarian cancer Mother   . Hyperlipidemia Mother   . Hypertension Mother   . Irritable bowel syndrome Mother   . Arthritis Father   . Hypertension Father   . Asthma Father   . Asthma Sister   . Arthritis Paternal Grandmother     Social History   Social History  . Marital status: Married    Spouse name: N/A  . Number of children: 0  . Years of education: N/A   Occupational History  . CNA/ CPT    Social History Main Topics  . Smoking status: Never Smoker  . Smokeless tobacco: Never Used  . Alcohol use 0.0 oz/week     Comment: occasionally  . Drug use: No  . Sexual activity: Yes   Other Topics Concern  . Not on file   Social History Narrative  . No narrative on file     Const: Denies fever or headaches. HEENT: Pt reports itching, redness, swelling, and clear drainage of the eyes, runny and stuffy nose, sneezing, sinus pressure, and ear pressure.  She denies sore  throat, ear drainage, or hearing changes. Pulm: Denies cough.  No other specific complaints in a complete review of systems (except as listed in HPI above).       Objective:   Physical Exam BP 130/86 (BP Location: Right Arm, Patient Position: Sitting, Cuff Size: Large)   Pulse 99   Temp 98.3 F (36.8 C) (Oral)   Wt 239 lb (108.4 kg)   LMP 01/14/2016   SpO2 98%   BMI 37.43 kg/m    General: Appears stated age, in no acute distress. HEENT:  No scleral icterus or conjunctival injection present.  EOMs intact.  TMs pearly grey, landmarks visible.  No erythema or discharge of nose.  No erythema or exudates of pharynx.  Maxillary sinuses tender to palpation. Neck: No lymphadenopathy present. Pulm: Clear to auscultation bilaterally.  No wheezes, rales, or  rhonchi. CV: Regular rate and rhythm.  No murmurs, rubs, or gallops.       Assessment & Plan:   Allergic Rhinitis:  eRx for Xyzal po daily at night eRx for Nasonex daily in the morning Discontinue Allegra Referral to allergist for further evaluation of worsening symptoms Call if symptoms worsen or do not improve She wants a steroid injection but declined as she has had 3 steroid injections and a round of Prednisone in the last 4 months. Nicki Reaper, NP

## 2016-04-20 ENCOUNTER — Other Ambulatory Visit: Payer: Self-pay | Admitting: Obstetrics & Gynecology

## 2016-04-21 ENCOUNTER — Encounter: Payer: Self-pay | Admitting: Internal Medicine

## 2016-04-21 LAB — CYTOLOGY - PAP

## 2016-04-21 LAB — HM PAP SMEAR: HM Pap smear: NORMAL

## 2016-04-22 ENCOUNTER — Encounter: Payer: Self-pay | Admitting: Internal Medicine

## 2016-05-25 ENCOUNTER — Other Ambulatory Visit: Payer: Self-pay | Admitting: Internal Medicine

## 2016-06-27 ENCOUNTER — Ambulatory Visit (INDEPENDENT_AMBULATORY_CARE_PROVIDER_SITE_OTHER): Payer: Self-pay | Admitting: Nurse Practitioner

## 2016-06-27 VITALS — BP 118/78 | HR 79 | Temp 98.5°F | Resp 20 | Ht 66.0 in | Wt 246.8 lb

## 2016-06-27 DIAGNOSIS — G43001 Migraine without aura, not intractable, with status migrainosus: Secondary | ICD-10-CM

## 2016-06-27 LAB — POCT URINE PREGNANCY: PREG TEST UR: NEGATIVE

## 2016-06-27 MED ORDER — KETOROLAC TROMETHAMINE 60 MG/2ML IM SOLN
60.0000 mg | INTRAMUSCULAR | Status: AC
  Administered 2016-06-27: 60 mg via INTRAMUSCULAR

## 2016-06-27 MED ORDER — PROMETHAZINE HCL 25 MG/ML IJ SOLN
25.0000 mg | INTRAMUSCULAR | Status: AC
  Administered 2016-06-27: 25 mg via INTRAVENOUS

## 2016-06-27 NOTE — Progress Notes (Signed)
Patient ID: Mikayla Briggs, female   DOB: 1981-04-01, 35 y.o.   MRN: [id number]  Subjective:    Mikayla Briggs is a 35 y.o. female who presents for evaluation of headache. Symptoms began this morning.  Patient denies an aura with onset.   Generally, the headaches last about 2 days and occur about every 3-4 months.. The headaches do not seem to be related to any time of day or year. The headaches are usually throbbing and are located in back of head and radiating into the neck  The patient rates her most severe headaches a 9 on a scale from 1 to 10. Recently, the headaches have been stable. Work attendance or other daily activities are affected by the headaches. Precipitating factors include: none which have been determined. The headaches are usually not preceded by an aura. Associated neurologic symptoms: decreased physical activity, dizziness and photophobia, fatigue with intermittent nausea since 06/22/16. The patient denies muscle weakness, numbness of extremities, speech difficulties and vision problems. Home treatment has included Imitrex with no improvement. Other history includes: recent sinusitis. The patient is trying to get pregnant.  Her LMP was on 9/5, which was normal with light spotting.  On 9/22 through 9/25 the patient stated her period came back on and was very "heavy". Unsure of pregnancy status at this time.   Review of Systems  Constitutional: negative except for fatigue Eyes: negative except for photophobia Ears, nose, mouth, throat, and face: negative Respiratory: negative Cardiovascular: negative Gastrointestinal: negative except for nausea Neurological: negative except for dizziness and headaches Behavioral/Psych: negative    Objective:    BP 118/78   Pulse 79   Temp 98.5 F (36.9 C) (Axillary)   Resp 20   Ht 5\' 6"  (1.676 m)   Wt 246 lb 13 oz (112 kg)   LMP 06/07/2016 (Exact Date) Comment: not normal period  SpO2 99%   BMI 39.84 kg/m   General appearance: alert, cooperative and mild distress Head: Normocephalic, without obvious abnormality, atraumatic Eyes: negative, conjunctivae/corneas clear. PERRL, EOM's intact. Fundi benign. Ears: normal TM's and external ear canals both ears Nose: Nares normal. Septum midline. Mucosa normal. No drainage or sinus tenderness. Throat: lips, mucosa, and tongue normal; teeth and gums normal Neck: no adenopathy, no carotid bruit, no JVD, supple, symmetrical, trachea midline and thyroid not enlarged, symmetric, no tenderness/mass/nodules Lungs: clear to auscultation bilaterally Heart: regular rate and rhythm, S1, S2 normal, no murmur, click, rub or gallop Abdomen: soft, non-tender; bowel sounds normal; no masses,  no organomegaly Extremities: extremities normal, atraumatic, no cyanosis or edema and no muscle weakness in bilateral UEs or bilateral LEs Neurologic: Alert and oriented X 3, normal strength and tone. Normal symmetric reflexes. Normal coordination and gait Mental status: Alert, oriented, thought content appropriate Cranial nerves: normal    Assessment:    Common migraine    Plan:    Lie in darkened room and apply cold packs as needed for pain. Side effect profile discussed in detail. Asked to keep headache diary. Patient reassured that neurodiagnostic workup not indicated from benign H&P.    Patient instructions provided. Patient verbalized understanding.

## 2016-06-27 NOTE — Patient Instructions (Addendum)

## 2016-06-29 ENCOUNTER — Ambulatory Visit
Admission: RE | Admit: 2016-06-29 | Discharge: 2016-06-29 | Disposition: A | Discharge status: Home / Self Care | Payer: Self-pay | Source: Ambulatory Visit | Attending: Internal Medicine | Admitting: Internal Medicine

## 2016-06-29 ENCOUNTER — Encounter: Payer: Self-pay | Admitting: Internal Medicine

## 2016-06-29 ENCOUNTER — Ambulatory Visit (INDEPENDENT_AMBULATORY_CARE_PROVIDER_SITE_OTHER): Payer: Self-pay | Admitting: Internal Medicine

## 2016-06-29 ENCOUNTER — Telehealth: Payer: Self-pay | Admitting: Internal Medicine

## 2016-06-29 ENCOUNTER — Other Ambulatory Visit: Payer: Self-pay | Admitting: Internal Medicine

## 2016-06-29 DIAGNOSIS — M546 Pain in thoracic spine: Secondary | ICD-10-CM

## 2016-06-29 DIAGNOSIS — M542 Cervicalgia: Secondary | ICD-10-CM

## 2016-06-29 MED ORDER — LEVOCETIRIZINE DIHYDROCHLORIDE 5 MG PO TABS: 5.0000 mg | ORAL_TABLET | Freq: Every evening | ORAL | 2 refills | Status: DC
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
## 2016-06-29 NOTE — Telephone Encounter (Signed)
Pt has appt with R Baity NP 06/29/16 at 3pm.
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
## 2016-06-29 NOTE — Patient Instructions (Signed)

## 2016-07-20 ENCOUNTER — Ambulatory Visit (INDEPENDENT_AMBULATORY_CARE_PROVIDER_SITE_OTHER): Payer: PRIVATE HEALTH INSURANCE | Admitting: Family Medicine

## 2016-07-20 ENCOUNTER — Encounter: Payer: Self-pay | Admitting: Family Medicine

## 2016-07-20 VITALS — BP 114/64 | HR 103 | Temp 98.1°F | Wt 241.5 lb

## 2016-07-20 DIAGNOSIS — R1013 Epigastric pain: Secondary | ICD-10-CM

## 2016-07-20 LAB — COMPREHENSIVE METABOLIC PANEL
ALT: 16 U/L (ref 0–35)
AST: 18 U/L (ref 0–37)
Albumin: 4.5 g/dL (ref 3.5–5.2)
Alkaline Phosphatase: 44 U/L (ref 39–117)
BUN: 8 mg/dL (ref 6–23)
CO2: 26 meq/L (ref 19–32)
Calcium: 9.8 mg/dL (ref 8.4–10.5)
Chloride: 106 mEq/L (ref 96–112)
Creatinine, Ser: 0.82 mg/dL (ref 0.40–1.20)
GFR: 84.27 mL/min (ref >60.00)
GLUCOSE: 91 mg/dL (ref 70–99)
POTASSIUM: 4 meq/L (ref 3.5–5.1)
SODIUM: 139 meq/L (ref 135–145)
Total Bilirubin: 0.4 mg/dL (ref 0.2–1.2)
Total Protein: 7.6 g/dL (ref 6.0–8.3)

## 2016-07-20 LAB — CBC WITH DIFFERENTIAL/PLATELET
BASOS PCT: 0.2 % (ref 0.0–3.0)
Basophils Absolute: 0 10*3/uL (ref 0.0–0.1)
EOS PCT: 5.2 % — AB (ref 0.0–5.0)
Eosinophils Absolute: 0.6 10*3/uL (ref 0.0–0.7)
HCT: 41.9 % (ref 36.0–46.0)
Hemoglobin: 13.8 g/dL (ref 12.0–15.0)
LYMPHS ABS: 2.7 10*3/uL (ref 0.7–4.0)
Lymphocytes Relative: 22.6 % (ref 12.0–46.0)
MCHC: 32.9 g/dL (ref 30.0–36.0)
MCV: 83.9 fl (ref 78.0–100.0)
MONOS PCT: 5.4 % (ref 3.0–12.0)
Monocytes Absolute: 0.7 10*3/uL (ref 0.1–1.0)
NEUTROS ABS: 8 10*3/uL — AB (ref 1.4–7.7)
NEUTROS PCT: 66.6 % (ref 43.0–77.0)
PLATELETS: 336 10*3/uL (ref 150.0–400.0)
RBC: 4.99 Mil/uL (ref 3.87–5.11)
RDW: 14.4 % (ref 11.5–15.5)
WBC: 12 10*3/uL — ABNORMAL HIGH (ref 4.0–10.5)

## 2016-07-20 LAB — LIPASE: LIPASE: 27 U/L (ref 11.0–59.0)

## 2016-07-20 MED ORDER — PROMETHAZINE HCL 25 MG PO TABS: 25.0000 mg | ORAL_TABLET | Freq: Three times a day (TID) | ORAL | 0 refills | Status: DC | PRN

## 2016-07-20 NOTE — Progress Notes (Signed)
(S) Mikayla Briggs is a 35 y.o. female with complaint of gastrointestinal symptoms of watery diarrhea, nausea for 2 days. No blood in stool.  Current Outpatient Prescriptions on File Prior to Visit  Medication Sig Dispense Refill  . fexofenadine (ALLEGRA) 180 MG tablet Take 180 mg by mouth 2 (two) times daily.    Marland Kitchen. levocetirizine (XYZAL) 5 MG tablet Take 1 tablet (5 mg total) by mouth every evening. 30 tablet 2  . mometasone (NASONEX) 50 MCG/ACT nasal spray Place 2 sprays into the nose daily. 17 g 12  . montelukast (SINGULAIR) 10 MG tablet TAKE ONE TABLET BY MOUTH EVERY NIGHT AT BEDTIME 30 tablet 2  . SUMAtriptan (IMITREX) 100 MG tablet Take 1 tablet (100 mg total) by mouth every 2 (two) hours as needed for migraine. May repeat in 2 hours if headache persists or recurs. 10 tablet 0   No current facility-administered medications on file prior to visit.     Allergies  Allergen Reactions  . Omeprazole Shortness Of Breath  . Penicillins Shortness Of Breath  . Shellfish Allergy Shortness Of Breath  . Soap Itching    ANYTHING WITH FRAGRANCE    Past Medical History:  Diagnosis Date  . 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 Migraines   . 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 Seasonal allergies     Past Surgical History:  Procedure Laterality Date  . CHOLECYSTECTOMY  11/2003  . KNEE ARTHROSCOPY Left 2000  . KNEE ARTHROSCOPY WITH MEDIAL MENISECTOMY Right 10/10/2014   Procedure: RIGHT KNEE ARTHROSCOPY; RESECTION PLICA; CHONDROPLASTY, AND PARTIAL MEDIAL MENISCECTOMY;  Surgeon: Loreta Aveaniel F Murphy, MD;  Location: [facility];  Service: Orthopedics;  Laterality: Right;  . TONSILLECTOMY AND ADENOIDECTOMY    . WISDOM TOOTH EXTRACTION      Family History  Problem Relation Age of Onset  . Arthritis Mother   . Ovarian cancer Mother   . Hyperlipidemia Mother   .  Hypertension Mother   . Irritable bowel syndrome Mother   . Arthritis Father   . Hypertension Father   . Asthma Father   . Asthma Sister   . Arthritis Paternal Grandmother     Social History   Social History  . Marital status: Married    Spouse name: N/A  . Number of children: 0  . Years of education: N/A   Occupational History  . CNA/ CPT    Social History Main Topics  . Smoking status: Never Smoker  . Smokeless tobacco: Never Used  . Alcohol use 0.0 oz/week     Comment: occasionally  . Drug use: No  . Sexual activity: Yes   Other Topics Concern  . Not on file   Social History Narrative  . No narrative on file   The PMH, PSH, Social History, Family History, Medications, and allergies [facility], and have been updated if relevant.  (O)  BP 114/64   Pulse (!) 103   Temp 98.1 F (36.7 C) (Oral)   Wt 241 lb 8 oz (109.5 kg)   LMP 06/27/2016 Comment: not normal period  SpO2 98%   BMI 38.98 kg/m   Physical exam reveals the patient appears well. Hydration status: well hydrated. Abdomen: she is quite tender at the epigastrium.  (A) Viral Gastroenteritis  (P) I have recommended small amounts clear fluids frequently, soups, juices, water and advance diet as tolerated.  Return office visit if symptoms persist or worsen; I have alerted the patient to call if high fever, dehydration, marked weakness, fainting, increased abdominal pain, blood in stool or vomit. Will check lipase today given epigastric tenderness. [facility] to use prn nausea- discussed sedation precautions.

## 2016-07-20 NOTE — Patient Instructions (Signed)

## 2016-07-20 NOTE — Progress Notes (Signed)
Pre visit review using our clinic review tool, if applicable. No additional management support is needed unless otherwise documented below in the visit note. 

## 2016-07-21 ENCOUNTER — Encounter: Payer: Self-pay | Admitting: Emergency Medicine

## 2016-07-21 ENCOUNTER — Telehealth: Payer: Self-pay

## 2016-07-21 ENCOUNTER — Emergency Department
Admission: EM | Admit: 2016-07-21 | Discharge: 2016-07-21 | Disposition: A | Discharge status: Home / Self Care | Payer: PRIVATE HEALTH INSURANCE | Attending: Student in an Organized Health Care Education/Training Program | Admitting: Student in an Organized Health Care Education/Training Program

## 2016-07-21 DIAGNOSIS — Z79899 Other long term (current) drug therapy: Secondary | ICD-10-CM | POA: Diagnosis not present

## 2016-07-21 DIAGNOSIS — R197 Diarrhea, unspecified: Secondary | ICD-10-CM | POA: Insufficient documentation

## 2016-07-21 LAB — URINALYSIS COMPLETE WITH MICROSCOPIC (ARMC ONLY)
Bilirubin Urine: NEGATIVE
Glucose, UA: NEGATIVE mg/dL
Hgb urine dipstick: NEGATIVE
Ketones, ur: NEGATIVE mg/dL
Nitrite: NEGATIVE
PROTEIN: NEGATIVE mg/dL
SPECIFIC GRAVITY, URINE: 1.027 (ref 1.005–1.030)
pH: 5 (ref 5.0–8.0)

## 2016-07-21 LAB — GASTROINTESTINAL PANEL BY PCR, STOOL (REPLACES STOOL CULTURE)
ASTROVIRUS: NOT DETECTED
Adenovirus F40/41: NOT DETECTED
Campylobacter species: NOT DETECTED
Cryptosporidium: NOT DETECTED
Cyclospora cayetanensis: NOT DETECTED
ENTEROAGGREGATIVE E COLI (EAEC): NOT DETECTED
ENTEROPATHOGENIC E COLI (EPEC): NOT DETECTED
ENTEROTOXIGENIC E COLI (ETEC): NOT DETECTED
Entamoeba histolytica: NOT DETECTED
GIARDIA LAMBLIA: NOT DETECTED
NOROVIRUS GI/GII: NOT DETECTED
Plesimonas shigelloides: NOT DETECTED
ROTAVIRUS A: NOT DETECTED
SALMONELLA SPECIES: NOT DETECTED
SHIGA LIKE TOXIN PRODUCING E COLI (STEC): NOT DETECTED
SHIGELLA/ENTEROINVASIVE E COLI (EIEC): NOT DETECTED
Sapovirus (I, II, IV, and V): NOT DETECTED
Vibrio cholerae: NOT DETECTED
Vibrio species: NOT DETECTED
Yersinia enterocolitica: NOT DETECTED

## 2016-07-21 LAB — COMPREHENSIVE METABOLIC PANEL
ALBUMIN: 4.1 g/dL (ref 3.5–5.0)
ALT: 17 U/L (ref 14–54)
ANION GAP: 8 (ref 5–15)
AST: 22 U/L (ref 15–41)
Alkaline Phosphatase: 44 U/L (ref 38–126)
BUN: 8 mg/dL (ref 6–20)
CO2: 25 mmol/L (ref 22–32)
Calcium: 9.1 mg/dL (ref 8.9–10.3)
Chloride: 106 mmol/L (ref 101–111)
Creatinine, Ser: 0.89 mg/dL (ref 0.44–1.00)
GFR calc Af Amer: >60 mL/min (ref >60)
GFR calc non Af Amer: >60 mL/min (ref >60)
GLUCOSE: 100 mg/dL — AB (ref 65–99)
POTASSIUM: 3.5 mmol/L (ref 3.5–5.1)
SODIUM: 139 mmol/L (ref 135–145)
Total Bilirubin: 0.5 mg/dL (ref 0.3–1.2)
Total Protein: 7.4 g/dL (ref 6.5–8.1)

## 2016-07-21 LAB — LIPASE, BLOOD: Lipase: 30 U/L (ref 11–51)

## 2016-07-21 LAB — CBC
HEMATOCRIT: 40.2 % (ref 35.0–47.0)
HEMOGLOBIN: 13.5 g/dL (ref 12.0–16.0)
MCH: 27.9 pg (ref 26.0–34.0)
MCHC: 33.7 g/dL (ref 32.0–36.0)
MCV: 82.7 fL (ref 80.0–100.0)
Platelets: 291 10*3/uL (ref 150–440)
RBC: 4.86 MIL/uL (ref 3.80–5.20)
RDW: 13.9 % (ref 11.5–14.5)
WBC: 13.3 10*3/uL — ABNORMAL HIGH (ref 3.6–11.0)

## 2016-07-21 LAB — PREGNANCY, URINE: PREG TEST UR: NEGATIVE

## 2016-07-21 MED ORDER — NITROFURANTOIN MONOHYD MACRO 100 MG PO CAPS: 100.0000 mg | ORAL_CAPSULE | Freq: Two times a day (BID) | ORAL | 0 refills | Status: AC

## 2016-07-21 MED ORDER — SODIUM CHLORIDE 0.9 % IV BOLUS (SEPSIS)
1000.0000 mL | Freq: Once | INTRAVENOUS | Status: AC
  Administered 2016-07-21: 1000 mL via INTRAVENOUS

## 2016-07-21 NOTE — ED Triage Notes (Signed)
Pt with nausea, diarrhea and abd pain going on 72hrs now. Pt states has had app 24 episodes of diarrhea in the past 12 hrs.

## 2016-07-21 NOTE — ED Provider Notes (Signed)
[facility] Emergency Department Provider Note    None    (approximate)  I have reviewed the triage vital signs and the nursing notes.   HISTORY  Chief Complaint Nausea; Diarrhea; and Abdominal Pain    HPI Mikayla Briggs is a 35 y.o. 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 Patient states that she's having over 20 episodes of loose stool every day. States that she felt sudden onset urge to defecate. Denies any nausea or vomiting. States that she does have rumbling sensation in her abdomen. Denies any dysuria. No blood or melena. No cough or shortness of breath. No known sick contacts.   Past Medical History:  Diagnosis Date  . 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 Migraines   . 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 Seasonal allergies    Family History  Problem Relation Age of Onset  . Arthritis Mother   . Ovarian cancer Mother   . Hyperlipidemia Mother   . Hypertension Mother   . Irritable bowel syndrome Mother   . Arthritis Father   . Hypertension Father   . Asthma Father   . Asthma Sister   . Arthritis Paternal Grandmother    Past Surgical History:  Procedure Laterality Date  . CHOLECYSTECTOMY  11/2003  . KNEE ARTHROSCOPY Left 2000  . KNEE ARTHROSCOPY WITH MEDIAL MENISECTOMY Right 10/10/2014   Procedure: RIGHT KNEE ARTHROSCOPY; RESECTION PLICA; CHONDROPLASTY, AND PARTIAL MEDIAL MENISCECTOMY;  Surgeon: Loreta Ave, MD;  Location: [facility];  Service: Orthopedics;  Laterality: Right;  . TONSILLECTOMY AND ADENOIDECTOMY    . WISDOM TOOTH EXTRACTION     Patient Active Problem List   Diagnosis Date Noted  . Environmental allergies 01/07/2016  . Anxiety 10/24/2015  . Tremor 10/24/2015  . Severe obesity (BMI >= 40) (HCC) 04/10/2014       Prior to Admission medications   Medication Sig Start Date End Date Taking? Authorizing Provider  fexofenadine (ALLEGRA) 180 MG tablet Take 180 mg by mouth 2 (two) times daily.   Yes Historical Provider, MD  levocetirizine (XYZAL) 5 MG tablet Take 1 tablet (5 mg total) by mouth every evening. 06/29/16  Yes Lorre Munroe, NP  mometasone (NASONEX) 50 MCG/ACT nasal spray Place 2 sprays into the nose daily. 04/06/16  Yes Lorre Munroe, NP  montelukast (SINGULAIR) 10 MG tablet TAKE ONE TABLET BY MOUTH EVERY NIGHT AT BEDTIME 05/25/16  Yes Lorre Munroe, NP  promethazine (PHENERGAN) 25 MG tablet Take 1 tablet (25 mg total) by mouth every 8 (eight) hours as needed for nausea or vomiting. 07/20/16  Yes Dianne Dun, MD  SUMAtriptan (IMITREX) 100 MG tablet Take 1 tablet (100 mg total) by mouth every 2 (two) hours as needed for migraine. May repeat in 2 hours if headache persists or recurs. 11/25/15  Yes Lorre Munroe, NP  nitrofurantoin, macrocrystal-monohydrate, (MACROBID) 100 MG capsule Take 1 capsule (100 mg total) by mouth 2 (two) times daily. 07/21/16 07/24/16  Willy Eddy, MD    Allergies Omeprazole; Penicillins; Shellfish allergy; and Soap    Social History Social History  Substance Use Topics  . Smoking status: Never Smoker  . Smokeless tobacco: Never Used  . Alcohol use 0.0 oz/week     Comment: occasionally    Review of Systems  Patient denies headaches, rhinorrhea, blurry vision, numbness, shortness of breath, chest pain, edema, cough, abdominal pain, nausea, vomiting, diarrhea, dysuria, fevers, rashes or hallucinations unless otherwise stated above in HPI. ____________________________________________   PHYSICAL EXAM:  VITAL SIGNS: Vitals:   07/21/16 2044 07/21/16 2313  BP: 126/78 112/72  Pulse: 70 75  Resp: 18 16  Temp:      Constitutional: Alert and oriented. Well appearing and in no acute distress. Eyes: Conjunctivae are normal. PERRL. EOMI. Head:  Atraumatic. Nose: No congestion/rhinnorhea. Mouth/Throat: Mucous membranes are moist.  Oropharynx non-erythematous. Neck: No stridor. Painless ROM. No cervical spine tenderness to palpation Hematological/Lymphatic/Immunilogical: No cervical lymphadenopathy. Cardiovascular: Normal rate, regular rhythm. Grossly normal heart sounds.  Good peripheral circulation. Respiratory: Normal respiratory effort.  No retractions. Lungs CTAB. Gastrointestinal: Soft and nontender. No distention. No abdominal bruits. No CVA tenderness. Genitourinary:  Musculoskeletal: No lower extremity tenderness nor edema.  No joint effusions. Neurologic:  Normal speech and language. No gross focal neurologic deficits are appreciated. No gait instability. Skin:  Skin is warm, dry and intact. No rash noted. Psychiatric: Mood and affect are normal. Speech and behavior are normal.  ____________________________________________   LABS (all labs ordered are listed, but only abnormal results are displayed)  Results for orders placed or performed during the hospital encounter of 07/21/16 (from the past 24 hour(s))  Lipase, blood     Status: None   Collection Time: 07/21/16  6:44 PM  Result Value Ref Range   Lipase 30 11 - 51 U/L  Comprehensive metabolic panel     Status: Abnormal   Collection Time: 07/21/16  6:44 PM  Result Value Ref Range   Sodium 139 135 - 145 mmol/L   Potassium 3.5 3.5 - 5.1 mmol/L   Chloride 106 101 - 111 mmol/L   CO2 25 22 - 32 mmol/L   Glucose, Bld 100 (H) 65 - 99 mg/dL   BUN 8 6 - 20 mg/dL   Creatinine, Ser 0.98 0.44 - 1.00 mg/dL   Calcium 9.1 8.9 - 11.9 mg/dL   Total Protein 7.4 6.5 - 8.1 g/dL   Albumin 4.1 3.5 - 5.0 g/dL   AST 22 15 - 41 U/L   ALT 17 14 - 54 U/L   Alkaline Phosphatase 44 38 - 126 U/L   Total Bilirubin 0.5 0.3 - 1.2 mg/dL   GFR calc non Af Amer >60 >60 mL/min   GFR calc Af Amer >60 >60 mL/min   Anion gap 8 5 - 15  CBC     Status: Abnormal   Collection Time: 07/21/16   6:44 PM  Result Value Ref Range   WBC 13.3 (H) 3.6 - 11.0 K/uL   RBC 4.86 3.80 - 5.20 MIL/uL   Hemoglobin 13.5 12.0 - 16.0 g/dL   HCT 14.7 82.9 - 56.2 %   MCV 82.7 80.0 - 100.0 fL   MCH 27.9 26.0 - 34.0 pg   MCHC 33.7 32.0 - 36.0 g/dL   RDW 13.0 86.5 - 78.4 %   Platelets 291 150 - 440 K/uL  Urinalysis complete, with microscopic     Status: Abnormal   Collection Time: 07/21/16  6:44 PM  Result Value Ref Range   Color, Urine YELLOW (A) YELLOW   APPearance CLEAR (A) CLEAR   Glucose, UA NEGATIVE NEGATIVE mg/dL   Bilirubin Urine NEGATIVE NEGATIVE   Ketones, ur NEGATIVE NEGATIVE mg/dL   Specific Gravity, Urine 1.027 1.005 - 1.030   Hgb urine dipstick NEGATIVE NEGATIVE   pH 5.0 5.0 -  8.0   Protein, ur NEGATIVE NEGATIVE mg/dL   Nitrite NEGATIVE NEGATIVE   Leukocytes, UA 1+ (A) NEGATIVE   RBC / HPF 0-5 0 - 5 RBC/hpf   WBC, UA 6-30 0 - 5 WBC/hpf   Bacteria, UA RARE (A) NONE SEEN   Squamous Epithelial / LPF 0-5 (A) NONE SEEN   Mucous PRESENT   Pregnancy, urine     Status: None   Collection Time: 07/21/16  6:44 PM  Result Value Ref Range   Preg Test, Ur NEGATIVE NEGATIVE  Gastrointestinal Panel by PCR , Stool     Status: None   Collection Time: 07/21/16  9:35 PM  Result Value Ref Range   Campylobacter species NOT DETECTED NOT DETECTED   Plesimonas shigelloides NOT DETECTED NOT DETECTED   Salmonella species NOT DETECTED NOT DETECTED   Yersinia enterocolitica NOT DETECTED NOT DETECTED   Vibrio species NOT DETECTED NOT DETECTED   Vibrio cholerae NOT DETECTED NOT DETECTED   Enteroaggregative E coli (EAEC) NOT DETECTED NOT DETECTED   Enteropathogenic E coli (EPEC) NOT DETECTED NOT DETECTED   Enterotoxigenic E coli (ETEC) NOT DETECTED NOT DETECTED   Shiga like toxin producing E coli (STEC) NOT DETECTED NOT DETECTED   Shigella/Enteroinvasive E coli (EIEC) NOT DETECTED NOT DETECTED   Cryptosporidium NOT DETECTED NOT DETECTED   Cyclospora cayetanensis NOT DETECTED NOT DETECTED    Entamoeba histolytica NOT DETECTED NOT DETECTED   Giardia lamblia NOT DETECTED NOT DETECTED   Adenovirus F40/41 NOT DETECTED NOT DETECTED   Astrovirus NOT DETECTED NOT DETECTED   Norovirus GI/GII NOT DETECTED NOT DETECTED   Rotavirus A NOT DETECTED NOT DETECTED   Sapovirus (I, II, IV, and V) NOT DETECTED NOT DETECTED   ____________________________________________  EKG____________________________________________  RADIOLOGY   ____________________________________________   PROCEDURES  Procedure(s) performed: none Procedures    Critical Care performed: no ____________________________________________   INITIAL IMPRESSION / ASSESSMENT AND PLAN / ED COURSE  Pertinent labs & imaging results that were available during my care of the patient were reviewed by me and considered in my medical decision making (see chart for details).  DDX: age, flu, infectious colitis, diverticulitis  Jordan Enzo BiMichelle Thompson-Brown is a 35 y.o. who presents to the ED with diffuse watery diarrhea for 3 days.  Patient is AFVSS in ED. Exam as above. Given current presentation have considered the above differential.  Patient is low. No acute distress. Based on her abdominal exam I do not feel that CT imaging clinically indicated. Less clinically consistent with diverticulitis or inflammatory bowel disease. She has no blood in her stools. Laboratory evaluation shows no evidence of electrolyte abnormality or contraction alkalosis. Renal function is normal. We'll send for stool PCR provide IV fluid bolus.  The patient will be placed on continuous pulse oximetry and telemetry for monitoring.  Laboratory evaluation will be sent to evaluate for the above complaints.     Clinical Course as of Jul 21 2318  Wed Jul 21, 2016  2106 Reassessed.  PAtient well appearing and HDS.  No stool production yet.  Likely has resolved.  Will continue IVF  [PR]    Clinical Course User Index [PR] Willy EddyPatrick Aleene Swanner, MD     ----------------------------------------- 11:19 PM on 07/21/2016 -----------------------------------------  Will treat for urinary UTI. Stool PCR unremarkable. Patient tolerating oral hydration. Discussed follow-up with PCP.  Have discussed with the patient and available family all diagnostics and treatments performed thus far and all questions were answered to the best of my ability. The patient demonstrates understanding  and agreement with plan.  ____________________________________________   FINAL CLINICAL IMPRESSION(S) / ED DIAGNOSES  Final diagnoses:  Diarrhea of presumed infectious origin      NEW MEDICATIONS STARTED DURING THIS VISIT:  New Prescriptions   NITROFURANTOIN, MACROCRYSTAL-MONOHYDRATE, (MACROBID) 100 MG CAPSULE    Take 1 capsule (100 mg total) by mouth 2 (two) times daily.     Note:  This document was prepared using Dragon voice recognition software and may include unintentional dictation errors.    Willy EddyPatrick Beckem Tomberlin, MD 07/21/16 [phone]

## 2016-07-21 NOTE — Telephone Encounter (Signed)
Pt left v/m; pt received lab results; now when pt tries to eat anything the food goes straight thru the pt (diarrhea). Pt request cb with what else can be done.Please advise.

## 2016-07-21 NOTE — Telephone Encounter (Signed)
It will take time.  Push fluids and if her symptoms continue, we can get a stool sample to look for other possible causes.

## 2016-07-21 NOTE — Telephone Encounter (Signed)
Spoke to pt and advised per Dr Aron.  

## 2016-08-10 ENCOUNTER — Ambulatory Visit (INDEPENDENT_AMBULATORY_CARE_PROVIDER_SITE_OTHER): Payer: PRIVATE HEALTH INSURANCE | Admitting: Internal Medicine

## 2016-08-10 ENCOUNTER — Encounter: Payer: Self-pay | Admitting: Internal Medicine

## 2016-08-10 VITALS — BP 116/80 | HR 104 | Temp 98.1°F | Wt 242.0 lb

## 2016-08-10 DIAGNOSIS — J3081 Allergic rhinitis due to animal (cat) (dog) hair and dander: Secondary | ICD-10-CM | POA: Diagnosis not present

## 2016-08-10 DIAGNOSIS — H65112 Acute and subacute allergic otitis media (mucoid) (sanguinous) (serous), left ear: Secondary | ICD-10-CM | POA: Diagnosis not present

## 2016-08-10 MED ORDER — CEFUROXIME AXETIL 500 MG PO TABS: 500.0000 mg | ORAL_TABLET | Freq: Two times a day (BID) | ORAL | 0 refills | Status: DC

## 2016-08-10 NOTE — Patient Instructions (Signed)
Earache, Adult An earache, or ear pain, can be caused by many things, including:  An infection.  Ear wax buildup.  Ear pressure.  Something in the ear that should not be there (foreign body).  A sore throat.  Tooth problems.  Jaw problems. Treatment of the earache will depend on the cause. If the cause is not clear or cannot be determined, you may need to watch your symptoms until your earache goes away or until a cause is found. Follow these instructions at home: Pay attention to any changes in your symptoms. Take these actions to help with your pain:  Take or apply over-the-counter and prescription medicines only as told by your health care provider.  If you were prescribed an antibiotic medicine, use it as told by your health care provider. Do not stop using the antibiotic even if you start to feel better.  Do not put anything in your ear other than medicine that is prescribed by your health care provider.  If directed, apply heat to the affected area as often as told by your health care provider. Use the heat source that your health care provider recommends, such as a moist heat pack or a heating pad.  Place a towel between your skin and the heat source.  Leave the heat on for 20-30 minutes.  Remove the heat if your skin turns bright red. This is especially important if you are unable to feel pain, heat, or cold. You may have a greater risk of getting burned.  If directed, put ice on the ear:  Put ice in a plastic bag.  Place a towel between your skin and the bag.  Leave the ice on for 20 minutes, 2-3 times a day.  Try resting in an upright position instead of lying down. This may help to reduce pressure in your ear and relieve pain.  Chew gum if it helps to relieve your ear pain.  Treat any allergies as told by your health care provider.  Keep all follow-up visits as told by your health care provider. This is important. Contact a health care provider if:  Your  pain does not improve within 2 days.  Your earache gets worse.  You have new symptoms.  You have a fever. Get help right away if:  You have a severe headache.  You have a stiff neck.  You have trouble swallowing.  You have redness or swelling behind your ear.  You have fluid or blood coming from your ear.  You have hearing loss.  You feel dizzy. This information is not intended to replace advice given to you by your health care provider. Make sure you discuss any questions you have with your health care provider. Document Released: 04/16/2004 Document Revised: 04/27/2016 Document Reviewed: 02/23/2016 Elsevier Interactive Patient Education  2017 [employer] Inc[employer]  

## 2016-08-10 NOTE — Progress Notes (Signed)
HPI  Pt presents to clinic today with c/o facial pain and pressure, bilateral ear pain, L>R, runny nose, nasal congestion and sore throat. This started 2 weeks ago, but reports it worsened in the last 2 days. She is blowing clear, bloody mucous out of her now. She describes the pain in her left ear as sharp and stabbing. She does have some decreased hearing. She denies difficulty swallowing. She denies fever, chills or body aches. She has tried Mucinex, Dayquil, Nyquil, Allegra, Nasonex, Xyzal without much relief. She has not had sick contacts but reports they are pulling the carpet up at her work and replacing it with wood laminate. Her symptoms began after they started this job. She has a long standing history of allergies to mold, dust, animal dander an pollen. She reports she used to see an allergist and was on allergy shots, but that made her worse. She reports history of recurrent sinus infections, for which she is treat with antibiotics and prednisone 3 + times a year.  Review of Systems     Past Medical History:  Diagnosis Date  . 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 Migraines   . 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 Seasonal allergies     Family History  Problem Relation Age of Onset  . Arthritis Mother   . Ovarian cancer Mother   . Hyperlipidemia Mother   . Hypertension Mother   . Irritable bowel syndrome Mother   . Arthritis Father   . Hypertension Father   . Asthma Father   . Asthma Sister   . Arthritis Paternal Grandmother     Social History   Social History  . Marital status: Married    Spouse name: N/A  . Number of children: 0  . Years of education: N/A   Occupational History  . CNA/ CPT    Social History Main Topics  . Smoking status: Never Smoker  . Smokeless tobacco: Never Used  . Alcohol use 0.0 oz/week     Comment: occasionally  . Drug use: No  . Sexual  activity: Yes   Other Topics Concern  . Not on file   Social History Narrative  . No narrative on file    Allergies  Allergen Reactions  . Omeprazole Shortness Of Breath  . Penicillins Shortness Of Breath  . Shellfish Allergy Shortness Of Breath  . Soap Itching    ANYTHING WITH FRAGRANCE     Constitutional: Denies headache, fatigue, fever or abrupt weight changes.  HEENT:  Positive eer pain, facial pain, runny nose, nasal congestion and sore throat. Denies eye redness, eye pain, ringing in the ears, wax buildup, or bloody nose. Respiratory: Denies cough, difficulty breathing or shortness of breath.  Cardiovascular: Denies chest pain, chest tightness, palpitations or swelling in the hands or feet.   No other specific complaints in a complete review of systems (except as listed in HPI above).  Objective:   BP 116/80   Pulse (!) 104   Temp 98.1 F (36.7 C) (Oral)   Wt 242 lb (109.8 kg)   LMP 06/27/2016 Comment: not normal period  SpO2 98%   BMI 39.06 kg/m   General: Appears her stated age, obese in NAD. HEENT: Head: normal shape and size, maxillary sinus tenderness noted; Eyes: sclera white, no icterus, conjunctiva pink; Right Ear: Tm gray and intact, normal light reflex, + serous effusion; Left Ear:  TM red and bulging, + mucoid effusion Nose: mucosa boggy and moist, septum midline; Throat/Mouth: + PND. Teeth present, mucosa erythematous and moist, no exudate noted, no lesions or ulcerations noted.  Neck:  No adenopathy noted.  Cardiovascular: Normal rate and rhythm. S1,S2 noted.  No murmur, rubs or gallops noted.  Pulmonary/Chest: Normal effort and positive vesicular breath sounds. No respiratory distress. No wheezes, rales or ronchi noted.       Assessment & Plan:   Allergic rhinitis/recurrent sinusitis:  Can use a Neti Pot which can be purchased from your local drug store. Stop Nasonex, start Flonase 2 sprays each nostril for 3 days and then as needed. Advised her  to continue Xyzal Stop Allegra, start Zyrtec 80 mg Depo IM today  Left otitis media:  eRx for Ceftin 500 mg BID x days Ibuprofen as needed for pain/inflammation  RTC as needed or if symptoms persist. Nicki ReaperBAITY, Reinette Cuneo, NP

## 2016-08-24 ENCOUNTER — Ambulatory Visit
Admission: RE | Admit: 2016-08-24 | Discharge: 2016-08-24 | Disposition: A | Discharge status: Home / Self Care | Payer: PRIVATE HEALTH INSURANCE | Source: Ambulatory Visit | Attending: Unknown Physician Specialty | Admitting: Unknown Physician Specialty

## 2016-08-24 ENCOUNTER — Other Ambulatory Visit: Payer: Self-pay | Admitting: Unknown Physician Specialty

## 2016-08-24 DIAGNOSIS — J329 Chronic sinusitis, unspecified: Secondary | ICD-10-CM

## 2016-08-27 ENCOUNTER — Other Ambulatory Visit: Payer: Self-pay | Admitting: Internal Medicine

## 2016-08-31 ENCOUNTER — Encounter: Payer: Self-pay | Admitting: *Deleted

## 2016-09-07 ENCOUNTER — Encounter
Admission: RE | Disposition: A | Discharge status: Home / Self Care | Payer: Self-pay | Source: Ambulatory Visit | Attending: Unknown Physician Specialty

## 2016-09-07 ENCOUNTER — Ambulatory Visit
Admission: RE | Admit: 2016-09-07 | Discharge: 2016-09-07 | Disposition: A | Discharge status: Home / Self Care | Payer: PRIVATE HEALTH INSURANCE | Source: Ambulatory Visit | Attending: Unknown Physician Specialty | Admitting: Unknown Physician Specialty

## 2016-09-07 ENCOUNTER — Ambulatory Visit: Payer: PRIVATE HEALTH INSURANCE | Admitting: Anesthesiology

## 2016-09-07 DIAGNOSIS — J3489 Other specified disorders of nose and nasal sinuses: Secondary | ICD-10-CM | POA: Insufficient documentation

## 2016-09-07 DIAGNOSIS — R51 Headache: Secondary | ICD-10-CM | POA: Insufficient documentation

## 2016-09-07 DIAGNOSIS — J320 Chronic maxillary sinusitis: Secondary | ICD-10-CM | POA: Insufficient documentation

## 2016-09-07 HISTORY — DX: Presence of spectacles and contact lenses: Z97.3

## 2016-09-07 HISTORY — PX: SEPTOPLASTY: SHX2393

## 2016-09-07 HISTORY — PX: MAXILLARY ANTROSTOMY: SHX2003

## 2016-09-07 HISTORY — PX: NASAL TURBINATE REDUCTION: SHX2072

## 2016-09-07 HISTORY — PX: IMAGE GUIDED SINUS SURGERY: SHX6570

## 2016-09-07 SURGERY — SINUS SURGERY, WITH IMAGING GUIDANCE
Anesthesia: General | Site: Nose | Wound class: Clean Contaminated

## 2016-09-07 MED ORDER — SCOPOLAMINE 1 MG/3DAYS TD PT72
1.0000 | MEDICATED_PATCH | TRANSDERMAL | Status: DC
  Administered 2016-09-07: 1.5 mg via TRANSDERMAL

## 2016-09-07 MED ORDER — ROCURONIUM BROMIDE 100 MG/10ML IV SOLN
INTRAVENOUS | Status: DC | PRN
  Administered 2016-09-07: 20 mg via INTRAVENOUS

## 2016-09-07 MED ORDER — BACITRACIN 500 UNIT/GM EX OINT
TOPICAL_OINTMENT | CUTANEOUS | Status: DC | PRN
  Administered 2016-09-07: 1 via TOPICAL

## 2016-09-07 MED ORDER — LIDOCAINE-EPINEPHRINE 2 %-1:100000 IJ SOLN
INTRAMUSCULAR | Status: DC | PRN
  Administered 2016-09-07: 11 mL

## 2016-09-07 MED ORDER — PROPOFOL 10 MG/ML IV BOLUS
INTRAVENOUS | Status: DC | PRN
  Administered 2016-09-07: 200 mg via INTRAVENOUS

## 2016-09-07 MED ORDER — OXYMETAZOLINE HCL 0.05 % NA SOLN
6.0000 | Freq: Once | NASAL | Status: AC
  Administered 2016-09-07: 6 via NASAL

## 2016-09-07 MED ORDER — FENTANYL CITRATE (PF) 100 MCG/2ML IJ SOLN
INTRAMUSCULAR | Status: DC | PRN
  Administered 2016-09-07: 100 ug via INTRAVENOUS

## 2016-09-07 MED ORDER — MEPERIDINE HCL 25 MG/ML IJ SOLN: 6.2500 mg | INTRAMUSCULAR | Status: DC | PRN

## 2016-09-07 MED ORDER — HYDROMORPHONE HCL 1 MG/ML IJ SOLN: 0.2500 mg | INTRAMUSCULAR | Status: DC | PRN

## 2016-09-07 MED ORDER — ONDANSETRON HCL 4 MG/2ML IJ SOLN
INTRAMUSCULAR | Status: DC | PRN
  Administered 2016-09-07: 4 mg via INTRAVENOUS

## 2016-09-07 MED ORDER — SUCCINYLCHOLINE CHLORIDE 20 MG/ML IJ SOLN
INTRAMUSCULAR | Status: DC | PRN
  Administered 2016-09-07: 100 mg via INTRAVENOUS

## 2016-09-07 MED ORDER — PHENYLEPHRINE HCL 0.5 % NA SOLN
NASAL | Status: DC | PRN
  Administered 2016-09-07: 30 mL via TOPICAL

## 2016-09-07 MED ORDER — SULFAMETHOXAZOLE-TRIMETHOPRIM 800-160 MG PO TABS: 1.0000 | ORAL_TABLET | Freq: Two times a day (BID) | ORAL | 0 refills | Status: DC

## 2016-09-07 MED ORDER — ACETAMINOPHEN 10 MG/ML IV SOLN
1000.0000 mg | Freq: Once | INTRAVENOUS | Status: AC
  Administered 2016-09-07: 1000 mg via INTRAVENOUS

## 2016-09-07 MED ORDER — LIDOCAINE HCL (CARDIAC) 20 MG/ML IV SOLN
INTRAVENOUS | Status: DC | PRN
  Administered 2016-09-07: 50 mg via INTRAVENOUS

## 2016-09-07 MED ORDER — LACTATED RINGERS IV SOLN
INTRAVENOUS | Status: DC
  Administered 2016-09-07: 11:00:00 via INTRAVENOUS

## 2016-09-07 MED ORDER — OXYCODONE HCL 5 MG PO TABS
5.0000 mg | ORAL_TABLET | Freq: Once | ORAL | Status: AC | PRN
  Administered 2016-09-07: 5 mg via ORAL

## 2016-09-07 MED ORDER — PROMETHAZINE HCL 25 MG/ML IJ SOLN: 6.2500 mg | INTRAMUSCULAR | Status: DC | PRN

## 2016-09-07 MED ORDER — OXYCODONE HCL 5 MG/5ML PO SOLN: 5.0000 mg | Freq: Once | ORAL | Status: AC | PRN

## 2016-09-07 MED ORDER — OXYCODONE-ACETAMINOPHEN 5-325 MG PO TABS: 1.0000 | ORAL_TABLET | ORAL | 0 refills | Status: DC | PRN

## 2016-09-07 MED ORDER — MIDAZOLAM HCL 5 MG/5ML IJ SOLN
INTRAMUSCULAR | Status: DC | PRN
  Administered 2016-09-07: 2 mg via INTRAVENOUS

## 2016-09-07 MED ORDER — DEXAMETHASONE SODIUM PHOSPHATE 4 MG/ML IJ SOLN
INTRAMUSCULAR | Status: DC | PRN
  Administered 2016-09-07: 8 mg via INTRAVENOUS

## 2016-09-07 SURGICAL SUPPLY — 35 items
BATTERY INSTRU NAVIGATION (MISCELLANEOUS) ×12 IMPLANT
BLADE SURG 15 STRL LF DISP TIS (BLADE) IMPLANT
BLADE SURG 15 STRL SS (BLADE)
CANISTER SUCT 1200ML W/VALVE (MISCELLANEOUS) ×3 IMPLANT
COAG SUCT 10F 3.5MM HAND CTRL (MISCELLANEOUS) ×3 IMPLANT
CUP MEDICINE 2OZ PLAST GRAD ST (MISCELLANEOUS) ×3 IMPLANT
DRAPE HEAD BAR (DRAPES) ×3 IMPLANT
DRESSING NASL FOAM PST OP SINU (MISCELLANEOUS) ×4 IMPLANT
DRSG NASAL FOAM POST OP SINU (MISCELLANEOUS) ×6
GLOVE BIO SURGEON STRL SZ7.5 (GLOVE) ×9 IMPLANT
HANDLE YANKAUER SUCT BULB TIP (MISCELLANEOUS) ×3 IMPLANT
KIT ROOM TURNOVER OR (KITS) ×3 IMPLANT
NAVIGATION MASK REG  ST (MISCELLANEOUS) ×3 IMPLANT
NEEDLE HYPO 25GX1X1/2 BEV (NEEDLE) ×3 IMPLANT
NS IRRIG 500ML POUR BTL (IV SOLUTION) ×3 IMPLANT
PACK DRAPE NASAL/ENT (PACKS) ×3 IMPLANT
PAD GROUND ADULT SPLIT (MISCELLANEOUS) ×3 IMPLANT
SOL ANTI-FOG 6CC FOG-OUT (MISCELLANEOUS) ×2 IMPLANT
SOL FOG-OUT ANTI-FOG 6CC (MISCELLANEOUS) ×1
SPLINT NASAL SEPTAL BLV .25 LG (MISCELLANEOUS) IMPLANT
SPLINT NASAL SEPTAL BLV .50 ST (MISCELLANEOUS) ×3 IMPLANT
SPONGE NEURO XRAY DETECT 1X3 (DISPOSABLE) ×3 IMPLANT
STRAP BODY AND KNEE 60X3 (MISCELLANEOUS) ×3 IMPLANT
SUT CHROMIC 3-0 (SUTURE) ×1
SUT CHROMIC 3-0 KS 27XMFL CR (SUTURE) ×2
SUT CHROMIC 5-0 (SUTURE) ×1
SUT CHROMIC 5-0 P2 18XMFL CR (SUTURE) ×2
SUT ETHILON 3-0 KS 30 BLK (SUTURE) ×3 IMPLANT
SUT PLAIN GUT 4-0 (SUTURE) ×3 IMPLANT
SUTURE CHRMC 3-0 KS 27XMFL CR (SUTURE) ×2 IMPLANT
SUTURE CHRMC 5-0 P2 18XMF CR (SUTURE) ×2 IMPLANT
SYRINGE 10CC LL (SYRINGE) ×3 IMPLANT
TOWEL OR 17X26 4PK STRL BLUE (TOWEL DISPOSABLE) ×3 IMPLANT
WATER STERILE IRR 250ML POUR (IV SOLUTION) ×3 IMPLANT
WATER STERILE IRR 500ML POUR (IV SOLUTION) ×3 IMPLANT

## 2016-09-07 NOTE — Anesthesia Procedure Notes (Signed)
Procedure Name: Intubation Date/Time: 09/07/2016 12:06 PM Performed by: Andee PolesBUSH, Ruairi Stutsman Pre-anesthesia Checklist: Patient identified, Emergency Drugs available, Suction available, Patient being monitored and Timeout performed Patient Re-evaluated:Patient Re-evaluated prior to inductionOxygen Delivery Method: Circle system utilized Preoxygenation: Pre-oxygenation with 100% oxygen Intubation Type: IV induction Ventilation: Mask ventilation without difficulty Laryngoscope Size: Mac and 3 Grade View: Grade I Tube type: Oral Rae Tube size: 7.0 mm Number of attempts: 1 Placement Confirmation: ETT inserted through vocal cords under direct vision,  positive ETCO2 and breath sounds checked- equal and bilateral Tube secured with: Tape Dental Injury: Teeth and Oropharynx as per pre-operative assessment

## 2016-09-07 NOTE — H&P (Signed)
  H+P  Reviewed and will be scanned in later. No changes noted. 

## 2016-09-07 NOTE — Op Note (Signed)
PREOPERATIVE DIAGNOSIS:  Chronic nasal obstruction. Chronic maxillary sinusitis  POSTOPERATIVE DIAGNOSIS:  Chronic nasal obstruction.  Same  SURGEON:  Davina Pokehapman T. Nile Dorning, M.D.  NAME OF PROCEDURE:  1. Nasal septoplasty. 2. Submucous resection of inferior turbinates. 3. Use of Stryker navigation system 4. Bilateral endoscopic maxillary antrostomy with removal of tissue 5. Bilateral endoscopic excision tip middle turbinates  OPERATIVE FINDINGS:  Severe nasal septal deformity, hypertrophy of the inferior turbinates.   DESCRIPTION OF THE PROCEDURE:  Mikayla Briggs was identified in the holding area and taken to the operating room and placed in the supine position.  After general endotracheal anesthesia was induced, the table was turned 45 degrees and the patient was placed in a semi-Fowler position.  The nose was then topically anesthetized with Lidocaine, cotton pledgets were placed within each nostril. After approximately 5 minutes, this was removed at which time a local anesthetic of 1% Lidocaine 1:100,000 units of Epinephrine was used to inject the inferior turbinates in the nasal septum. A total of 13 ml was used. Examination of the nose showed a severe left nasal septal deformity and tremendous hypertrophied inferior turbinate.  there was also polypoid disease of the anterior tip of the middle turbinates bilaterally. Beginning on the right hand side a hemitransfixion incision was then created on the leading edge of the septum on the right.  A subperichondrial plane was elevated posteriorly on the left and taken back to the perpendicular plate of the ethmoid where subperiosteal plane was elevated posteriorly on the left. A large septal spur was identified on the left hand side impacting on the inferior turbinate.  An inferior rim of cartilage was removed anteriorly with care taken to leave an anterior strut to prevent nasal collapse. With this strut removed the perpendicular plate of  the ethmoid was separated from the quadrangular cartilage. The large septal spur was removed.  The septum was then replaced in the midline. Reinspection through each nostril showed excellent reduction of the septal deformity. A left posterior inferior fenestration was then created to allow hematoma drainage.  With the septoplasty completed, the operation then proceeded with the endoscopic sinus portion. The 0 endoscope was brought into the field examination left-hand side so polypoid disease of the anterior tip of the middle turbinate obstructing the view to the uncinate process. Therefore the Tru cuts were then used to remove the anterior tip of the middle turbinate. This gave excellent visualization to the ostiomeatal region. Suction cautery was then used to cauterize the tip of the middle turbinate prevent bleeding. Using the Stryker navigation the uncinate process was identified in the Cottle elevator was then used to take down the uncinate process and the straight 45 forceps were used to clear this free. The max or sinus was entered easily using the curved suction side biting forceps were used to remove the covering to the sinus as well as some polypoid mucosa. This was sent for specimen. With the sinus widely open the operation then turned to the actual antrostomy on the right. Again there was polypoid disease in the anterior tip of the middle turbinate obstructing the view therefore this was also removed using the Tru-Cut forceps and then cauterized with suction cautery prevent bleeding. Again the Stryker navigation device was used the uncinate process was taken down using the Cottle knife straight and side biting forceps were then used to open the maxillary sinus widely. With this completed the operation then turned to the submucous section inferior turbinates.   Beginning on the left-hand  side, a 15 blade was used to incise along the inferior edge of the inferior turbinate. A superior laterally based  flap was then elevated. The underlying conchal bone of mucosa was excised using Knight scissors. The flap was then laid back over the turbinate stump and cauterized using suction cautery. In a similar fashion the submucous resection was performed on the right.  With the submucous resection completed bilaterally and no active bleeding, the hemitransfixion incision was then closed using two interrupted 3-0 chromic sutures.  Plastic nasal septal splints were placed within each nostril and affixed to the septum using a 3-0 nylon suture. Stammberger was then used beneath each inferior turbinate for hemostasis.    The patient tolerated the procedure well, was returned to anesthesia, extubated in the operating room, and taken to the recovery room in stable condition.    CULTURES:  None.  SPECIMENS:  maxillary sinus contents  ESTIMATED BLOOD LOSS:  25 cc.  Antwian Santaana T  09/07/2016  12:58 PM

## 2016-09-07 NOTE — Discharge Instructions (Signed)
[facility] °[facility] °ENDOSCOPIC SINUS SURGERY °[facility] ° °What is Functional Endoscopic Sinus Surgery? ° The Surgery involves making the natural openings of the sinuses larger by removing the bony partitions that separate the sinuses from the nasal cavity.  The natural sinus lining is preserved as much as possible to allow the sinuses to resume normal function after the surgery.  In some patients nasal polyps (excessively swollen lining of the sinuses) may be removed to relieve obstruction of the sinus openings.  The surgery is performed through the nose using lighted scopes, which eliminates the need for incisions on the face.  A septoplasty is a different procedure which is sometimes performed with sinus surgery.  It involves straightening the boy partition that separates the two sides of your nose.  A crooked or deviated septum may need repair if is obstructing the sinuses or nasal airflow.  Turbinate reduction is also often performed during sinus surgery.  The turbinates are bony proturberances from the side walls of the nose which swell and can obstruct the nose in patients with sinus and allergy problems.  Their size can be surgically reduced to help relieve nasal obstruction. ° °What Can Sinus Surgery Do For Me? ° Sinus surgery can reduce the frequency of sinus infections requiring antibiotic treatment.  This can provide improvement in nasal congestion, post-nasal drainage, facial pressure and nasal obstruction.  Surgery will NOT prevent you from ever having an infection again, so it usually only for patients who get infections 4 or more times yearly requiring antibiotics, or for infections that do not clear with antibiotics.  It will not cure nasal allergies, so patients with allergies may still require medication to treat their allergies after surgery. Surgery may improve headaches related to sinusitis, however, some people will continue to  require medication to control sinus headaches related to allergies.  Surgery will do nothing for other forms of headache (migraine, tension or cluster). ° °What Are the Risks of Endoscopic Sinus Surgery? ° Current techniques allow surgery to be performed safely with little risk, however, there are rare complications that patients should be aware of.  Because the sinuses are located around the eyes, there is risk of eye injury, including blindness, though again, this would be quite rare. This is usually a result of bleeding behind the eye during surgery, which puts the vision oat risk, though there are treatments to protect the vision and prevent permanent disrupted by surgery causing a leak of the spinal fluid that surrounds the brain.  More serious complications would include bleeding inside the brain cavity or damage to the brain.  Again, all of these complications are uncommon, and spinal fluid leaks can be safely managed surgically if they occur.  The most common complication of sinus surgery is bleeding from the nose, which may require packing or cauterization of the nose.  Continued sinus have polyps may experience recurrence of the polyps requiring revision surgery.  Alterations of sense of smell or injury to the tear ducts are also rare complications.  ° °What is the Surgery Like, and what is the Recovery? ° The Surgery usually takes a couple of hours to perform, and is usually performed under a general anesthetic (completely asleep).  Patients are usually discharged home after a couple of hours.  Sometimes during surgery it is necessary to pack the nose to control bleeding, and the packing is left in place for 24 - 48 hours, and removed by your surgeon.    If a septoplasty was performed during the procedure, there is often a splint placed which must be removed after 5-7 days.   °Discomfort: Pain is usually mild to moderate, and can be controlled by prescription pain medication or acetaminophen (Tylenol).   Aspirin, Ibuprofen (Advil, Motrin), or Naprosyn (Aleve) should be avoided, as they can cause increased bleeding.  Most patients feel sinus pressure like they have a bad head cold for several days.  Sleeping with your head elevated can help reduce swelling and facial pressure, as can ice packs over the face.  A humidifier may be helpful to keep the mucous and blood from drying in the nose.  ° °Diet: There are no specific diet restrictions, however, you should generally start with clear liquids and a light diet of bland foods because the anesthetic can cause some nausea.  Advance your diet depending on how your stomach feels.  Taking your pain medication with food will often help reduce stomach upset which pain medications can cause. ° °Nasal Saline Irrigation: It is important to remove blood clots and dried mucous from the nose as it is healing.  This is done by having you irrigate the nose at least 3 - 4 times daily with a salt water solution.  We recommend using NeilMed Sinus Rinse (available at the drug store).  Fill the squeeze bottle with the solution, bend over a sink, and insert the tip of the squeeze bottle into the nose ½ of an inch.  Point the tip of the squeeze bottle towards the inside corner of the eye on the same side your irrigating.  Squeeze the bottle and gently irrigate the nose.  If you bend forward as you do this, most of the fluid will flow back out of the nose, instead of down your throat.   The solution should be warm, near body temperature, when you irrigate.   Each time you irrigate, you should use a full squeeze bottle.  ° °Note that if you are instructed to use Nasal Steroid Sprays at any time after your surgery, irrigate with saline BEFORE using the steroid spray, so you do not wash it all out of the nose. °Another product, Nasal Saline Gel (such as AYR Nasal Saline Gel) can be applied in each nostril 3 - 4 times daily to moisture the nose and reduce scabbing or crusting. ° °Bleeding:   Bloody drainage from the nose can be expected for several days, and patients are instructed to irrigate their nose frequently with salt water to help remove mucous and blood clots.  The drainage may be dark red or brown, though some fresh blood may be seen intermittently, especially after irrigation.  Do not blow you nose, as bleeding may occur. If you must sneeze, keep your mouth open to allow air to escape through your mouth. ° °If heavy bleeding occurs: Irrigate the nose with saline to rinse out clots, then spray the nose 3 - 4 times with Afrin Nasal Decongestant Spray.  The spray will constrict the blood vessels to slow bleeding.  Pinch the lower half of your nose shut to apply pressure, and lay down with your head elevated.  Ice packs over the nose may help as well. If bleeding persists despite these measures, you should notify your doctor.  Do not use the Afrin routinely to control nasal congestion after surgery, as it can result in worsening congestion and may affect healing.  ° ° ° °Activity: Return to work varies among patients. Most patients will be   out of work at least 5 - 7 days to recover.  Patient may return to work after they are off of narcotic pain medication, and feeling well enough to perform the functions of their job.  Patients must avoid heavy lifting (over 10 pounds) or strenuous physical for 2 weeks after surgery, so your employer may need to assign you to light duty, or keep you out of work longer if light duty is not possible.  NOTE: you should not drive, operate dangerous machinery, do any mentally demanding tasks or make any important legal or financial decisions while on narcotic pain medication and recovering from the general anesthetic.    Call Your Doctor Immediately if You Have Any of the Following: 1. Bleeding that you cannot control with the above measures 2. Loss of vision, double vision, bulging of the eye or black eyes. 3. Fever over 101 degrees 4. Neck stiffness with  severe headache, fever, nausea and change in mental state. You are always encourage to call anytime with concerns, however, please call with requests for pain medication refills during office hours.  Office Endoscopy: During follow-up visits your doctor will remove any packing or splints that may have been placed and evaluate and clean your sinuses endoscopically.  Topical anesthetic will be used to make this as comfortable as possible, though you may want to take your pain medication prior to the visit.  How often this will need to be done varies from patient to patient.  After complete recovery from the surgery, you may need follow-up endoscopy from time to time, particularly if there is concern of recurrent infection or nasal polyps.  General Anesthesia, Adult, Care After These instructions provide you with information about caring for yourself after your procedure. Your health care provider may also give you more specific instructions. Your treatment has been planned according to current medical practices, but problems sometimes occur. Call your health care provider if you have any problems or questions after your procedure. What can I expect after the procedure? After the procedure, it is common to have:  Vomiting.  A sore throat.  Mental slowness. It is common to feel:  Nauseous.  Cold or shivery.  Sleepy.  Tired.  Sore or achy, even in parts of your body where you did not have surgery. Follow these instructions at home: For at least 24 hours after the procedure:  Do not:  Participate in activities where you could fall or become injured.  Drive.  Use heavy machinery.  Drink alcohol.  Take sleeping pills or medicines that cause drowsiness.  Make important decisions or sign legal documents.  Take care of children on your own.  Rest. Eating and drinking  If you vomit, drink water, juice, or soup when you can drink without vomiting.  Drink enough fluid to keep your  urine clear or pale yellow.  Make sure you have little or no nausea before eating solid foods.  Follow the diet recommended by your health care provider. General instructions  Have a responsible adult stay with you until you are awake and alert.  Return to your normal activities as told by your health care provider. Ask your health care provider what activities are safe for you.  Take over-the-counter and prescription medicines only as told by your health care provider.  If you smoke, do not smoke without supervision.  Keep all follow-up visits as told by your health care provider. This is important. Contact a health care provider if:  You continue to have  nausea or vomiting at home, and medicines are not helpful.  You cannot drink fluids or start eating again.  You cannot urinate after 8-12 hours.  You develop a skin rash.  You have fever.  You have increasing redness at the site of your procedure. Get help right away if:  You have difficulty breathing.  You have chest pain.  You have unexpected bleeding.  You feel that you are having a life-threatening or urgent problem. This information is not intended to replace advice given to you by your health care provider. Make sure you discuss any questions you have with your health care provider. Document Released: 12/06/2000 Document Revised: 02/02/2016 Document Reviewed: 08/14/2015 Elsevier Interactive Patient Education  2017 [employer]  Scopolamine skin patches REMOVE PATCH IN 72 HOURS AND IMMEDIATELY WASH YOUR HANDS What is this medicine? SCOPOLAMINE (skoe POL a meen) is used to prevent nausea and vomiting caused by motion sickness, anesthesia and surgery. This medicine may be used for other purposes; ask your health care provider or pharmacist if you have questions. COMMON BRAND NAME(S): Transderm Scop What should I tell my health care provider before I take this medicine? They need to know if you have any of these  conditions: -glaucoma -kidney or liver disease -an unusual or allergic reaction (especially skin allergy) to scopolamine, atropine, other medicines, foods, dyes, or preservatives -pregnant or trying to get pregnant -breast-feeding How should I use this medicine? This medicine is for external use only. Follow the directions on the prescription label. One patch contains enough medicine to prevent motion sickness for up to 3 days. Apply the patch at least 4 hours before you need it and only wear one disc at a time. Choose an area behind the ear, that is clean, dry, hairless and free from any cuts or irritation. Wipe the area with a clean dry tissue. Peel off the plastic backing of the skin patch, trying not to touch the adhesive side with your hands. Do not cut the patches. Firmly apply to the area you have chosen, with the metallic side of the patch to the skin and the tan-colored side showing. Once firmly in place, wash your hands well with soap and water. Remove the disc after 3 days, or sooner if you no longer need it. After removing the patch, wash your hands and the area behind your ear thoroughly with soap and water. The patch will still contain some medicine after use. To avoid accidental contact or ingestion by children or pets, fold the used patch in half with the sticky side together and throw away in the trash out of the reach of children and pets. If you need to use a second patch after you remove the first, place it behind the other ear. Talk to your pediatrician regarding the use of this medicine in children. Special care may be needed. Overdosage: If you think you have taken too much of this medicine contact a poison control center or emergency room at once. NOTE: This medicine is only for you. Do not share this medicine with others. What if I miss a dose? Make sure you apply the patch at least 4 hours before you need it. You can apply it the night before traveling. What may interact with  this medicine? -benztropine -bethanechol -medicines for anxiety or sleeping problems like diazepam or temazepam -medicines for hay fever and other allergies -medicines for mental depression -muscle relaxants This list may not describe all possible interactions. Give your health care provider a  list of all the medicines, herbs, non-prescription drugs, or dietary supplements you use. Also tell them if you smoke, drink alcohol, or use illegal drugs. Some items may interact with your medicine. What should I watch for while using this medicine? Keep the patch dry, if possible, to prevent it from falling off. Limited contact with water, however, as in bathing or swimming, will not affect the system. If the patch falls off, throw it away and put a new one behind the other ear. You may get drowsy or dizzy. Do not drive, use machinery, or do anything that needs mental alertness until you know how this medicine affects you. Do not stand or sit up quickly, especially if you are an older patient. This reduces the risk of dizzy or fainting spells. Alcohol may interfere with the effect of this medicine. Avoid alcoholic drinks. Your mouth may get dry. Chewing sugarless gum or sucking hard candy, and drinking plenty of water may help. Contact your doctor if the problem does not go away or is severe. This medicine may cause dry eyes and blurred vision. If you wear contact lenses you may feel some discomfort. Lubricating drops may help. See your eye doctor if the problem does not go away or is severe. If you are going to have a magnetic resonance imaging (MRI) procedure, tell your MRI technician if you have this patch on your body. It must be removed before a MRI. What side effects may I notice from receiving this medicine? Side effects that you should report to your doctor or health care professional as soon as possible: -agitation, nervousness, confusion -blurred vision and other eye problems -dizziness,  drowsiness -eye pain or redness in the whites of the eye -hallucinations -pain or difficulty passing urine -skin rash, itching -vomiting Side effects that usually do not require medical attention (report to your doctor or health care professional if they continue or are bothersome): -headache -nausea This list may not describe all possible side effects. Call your doctor for medical advice about side effects. You may report side effects to FDA at 1-800-FDA-1088. Where should I keep my medicine? Keep out of the reach of children. Store at room temperature between 20 and 25 degrees C (68 and 77 degrees F). Throw away any unused medicine after the expiration date. When you remove a patch, fold it and throw it in the trash as described above. NOTE: This sheet is a summary. It may not cover all possible information. If you have questions about this medicine, talk to your doctor, pharmacist, or health care provider.  2017 Elsevier/Gold Standard (2012-01-27 13:31:48)

## 2016-09-07 NOTE — Anesthesia Postprocedure Evaluation (Signed)
Anesthesia Post Note  Patient: Mikayla Briggs  Procedure(s) Performed: Procedure(s) (LRB): IMAGE GUIDED SINUS SURGERY (N/A) SEPTOPLASTY (N/A) TURBINATE REDUCTION/SUBMUCOSAL RESECTION (Bilateral) MAXILLARY ANTROSTOMY WITH TISSUE REMOVAL (Bilateral)  Patient location during evaluation: PACU Anesthesia Type: General Level of consciousness: awake and alert and oriented Pain management: pain level controlled Vital Signs Assessment: post-procedure vital signs reviewed and stable Respiratory status: spontaneous breathing and nonlabored ventilation Cardiovascular status: stable Postop Assessment: no signs of nausea or vomiting and adequate PO intake Anesthetic complications: no    Harolyn RutherfordJoshua Brinleigh Tew

## 2016-09-07 NOTE — Transfer of Care (Signed)
Immediate Anesthesia Transfer of Care Note  Patient: Mikayla Briggs  Procedure(s) Performed: Procedure(s) with comments: IMAGE GUIDED SINUS SURGERY (N/A) - STYKER GAVE DISK TO CECE 12-20 kp SEPTOPLASTY (N/A) TURBINATE REDUCTION/SUBMUCOSAL RESECTION (Bilateral) MAXILLARY ANTROSTOMY WITH TISSUE REMOVAL (Bilateral)  Patient Location: PACU  Anesthesia Type: General  Level of Consciousness: awake, alert  and patient cooperative  Airway and Oxygen Therapy: Patient Spontanous Breathing and Patient connected to supplemental oxygen  Post-op Assessment: Post-op Vital signs reviewed, Patient's Cardiovascular Status Stable, Respiratory Function Stable, Patent Airway and No signs of Nausea or vomiting  Post-op Vital Signs: Reviewed and stable  Complications: No apparent anesthesia complications

## 2016-09-07 NOTE — Anesthesia Preprocedure Evaluation (Signed)
Anesthesia Evaluation  Patient identified by MRN, date of birth, ID band Patient awake    Reviewed: Allergy & Precautions, NPO status , Patient's Chart, lab work & pertinent test results  Airway Mallampati: II  TM Distance: >3 FB Neck ROM: Full    Dental   Pulmonary neg pulmonary ROS,    Pulmonary exam normal        Cardiovascular negative cardio ROS Normal cardiovascular exam     Neuro/Psych  Headaches,    GI/Hepatic negative GI ROS, Neg liver ROS,   Endo/Other  negative endocrine ROS  Renal/GU negative Renal ROS     Musculoskeletal negative musculoskeletal ROS (+)   Abdominal (+) + obese,   Peds  Hematology negative hematology ROS (+)   Anesthesia Other Findings   Reproductive/Obstetrics negative OB ROS                             Anesthesia Physical Anesthesia Plan  ASA: II  Anesthesia Plan: General   Post-op Pain Management:    Induction: Intravenous  Airway Management Planned: Oral ETT  Additional Equipment:   Intra-op Plan:   Post-operative Plan:   Informed Consent: I have reviewed the patients History and Physical, chart, labs and discussed the procedure including the risks, benefits and alternatives for the proposed anesthesia with the patient or authorized representative who has indicated his/her understanding and acceptance.     Plan Discussed with: CRNA  Anesthesia Plan Comments:         Anesthesia Quick Evaluation

## 2016-09-08 ENCOUNTER — Encounter: Payer: Self-pay | Admitting: Unknown Physician Specialty

## 2016-09-09 LAB — SURGICAL PATHOLOGY

## 2016-09-21 ENCOUNTER — Other Ambulatory Visit: Payer: Self-pay | Admitting: Internal Medicine

## 2016-09-21 DIAGNOSIS — M546 Pain in thoracic spine: Secondary | ICD-10-CM

## 2016-09-21 DIAGNOSIS — M542 Cervicalgia: Secondary | ICD-10-CM

## 2016-10-08 ENCOUNTER — Other Ambulatory Visit (HOSPITAL_COMMUNITY): Payer: Self-pay | Admitting: Obstetrics and Gynecology

## 2016-10-08 DIAGNOSIS — Z3141 Encounter for fertility testing: Secondary | ICD-10-CM

## 2016-10-12 ENCOUNTER — Ambulatory Visit (HOSPITAL_COMMUNITY)
Admission: RE | Admit: 2016-10-12 | Discharge: 2016-10-12 | Disposition: A | Discharge status: Home / Self Care | Payer: PRIVATE HEALTH INSURANCE | Source: Ambulatory Visit | Attending: Obstetrics and Gynecology | Admitting: Obstetrics and Gynecology

## 2016-10-12 DIAGNOSIS — Z3141 Encounter for fertility testing: Secondary | ICD-10-CM | POA: Insufficient documentation

## 2016-10-12 MED ORDER — IOPAMIDOL (ISOVUE-300) INJECTION 61%
30.0000 mL | Freq: Once | INTRAVENOUS | Status: AC | PRN
  Administered 2016-10-12: 8 mL

## 2016-11-22 ENCOUNTER — Other Ambulatory Visit: Payer: Self-pay | Admitting: Internal Medicine

## 2017-01-24 ENCOUNTER — Telehealth: Payer: Self-pay | Admitting: *Deleted

## 2017-01-24 NOTE — Telephone Encounter (Signed)
Pt was unavailable--job phone -- lm for pt to retrun my call

## 2017-01-24 NOTE — Telephone Encounter (Signed)
Patient left a voicemail stating that she had knee surgery about 1 and 1/2 years ago and had injured her right foot after surgery. Patient thinks that she has injured her foot again and wants to know if she should come here or go ahead and try to get back in with her orthopedist Murphy-Wainer?

## 2017-01-24 NOTE — Telephone Encounter (Signed)
She can make an appt with Cathleen FearsMurphey Wainer. If they won't see her she can make an appt here.

## 2017-01-24 NOTE — Telephone Encounter (Signed)
Pt is aware--- pt will try to get appt with murphy but scheduled for Thurs in the meantime

## 2017-01-27 ENCOUNTER — Ambulatory Visit: Payer: PRIVATE HEALTH INSURANCE | Admitting: Internal Medicine

## 2017-03-17 ENCOUNTER — Ambulatory Visit (INDEPENDENT_AMBULATORY_CARE_PROVIDER_SITE_OTHER): Payer: PRIVATE HEALTH INSURANCE | Admitting: Family Medicine

## 2017-03-17 ENCOUNTER — Encounter: Payer: Self-pay | Admitting: Family Medicine

## 2017-03-17 VITALS — BP 102/78 | HR 101 | Temp 98.5°F | Ht 66.0 in | Wt 250.5 lb

## 2017-03-17 DIAGNOSIS — J209 Acute bronchitis, unspecified: Secondary | ICD-10-CM

## 2017-03-17 MED ORDER — DOXYCYCLINE HYCLATE 100 MG PO TABS: 100.0000 mg | ORAL_TABLET | Freq: Two times a day (BID) | ORAL | 0 refills | Status: AC

## 2017-03-17 NOTE — Progress Notes (Signed)
Dr. Karleen HampshireSpencer T. Carmita Boom, MD, CAQ Sports Medicine Primary Care and Sports Medicine [address] [place], [id number] Phone: [phone] Fax: [phone]  03/17/2017  Patient: Mikayla SeedsLauren Michelle Thompson-Brown, MRN: [id number], DOB: 02/04/1981, 36 y.o.  Primary Physician:  Lorre MunroeBaity, Regina W, NP   Chief Complaint  Patient presents with  . Wheezing    & tightness in chest  . Hoarse  . Sore Throat   Subjective:   Mikayla SeedsLauren Michelle Thompson-Brown is a 36 y.o. very pleasant female patient who presents with the following:  Patient presents for presents evaluation of myalgias, nasal congestion, productive cough, rhinorrhea , sneezing and sore throat. Symptoms began 2d ago and are gradually worsening since that time.    Woke up with wheezing and tightness in chest.  Never been a smoker.   Risk Factors:  The patient denies significant nausea, vomitting, diarrhea, rash, diffuse arthralgia or myalgia. They also deny high fever.   Past Medical History, Surgical History, Social History, Family History, Problem List, Medications, and Allergies have been reviewed and updated if relevant.  Patient Active Problem List   Diagnosis Date Noted  . Environmental allergies 01/07/2016  . Anxiety 10/24/2015  . Tremor 10/24/2015  . Severe obesity (BMI >= 40) (HCC) 04/10/2014    Past Medical History:  Diagnosis Date  . 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 Migraines   . 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 Seasonal allergies   . Wears contact lenses     Past Surgical History:  Procedure Laterality Date  . CHOLECYSTECTOMY  11/2003  . IMAGE GUIDED SINUS SURGERY N/A 09/07/2016   Procedure: IMAGE GUIDED SINUS SURGERY;  Surgeon: Linus Salmonshapman McQueen, MD;  [facility];  Service: ENT;  Laterality: N/A;  STYKER GAVE DISK TO CECE 12-20 kp  . KNEE ARTHROSCOPY Left 2000  . KNEE ARTHROSCOPY  WITH MEDIAL MENISECTOMY Right 10/10/2014   Procedure: RIGHT KNEE ARTHROSCOPY; RESECTION PLICA; CHONDROPLASTY, AND PARTIAL MEDIAL MENISCECTOMY;  Surgeon: Loreta Aveaniel F Murphy, MD;  Location: [facility];  Service: Orthopedics;  Laterality: Right;  . MAXILLARY ANTROSTOMY Bilateral 09/07/2016   Procedure: MAXILLARY ANTROSTOMY WITH TISSUE REMOVAL;  Surgeon: Linus Salmonshapman McQueen, MD;  [facility];  Service: ENT;  Laterality: Bilateral;  . NASAL TURBINATE REDUCTION Bilateral 09/07/2016   Procedure: TURBINATE REDUCTION/SUBMUCOSAL RESECTION;  Surgeon: Linus Salmonshapman McQueen, MD;  [facility];  Service: ENT;  Laterality: Bilateral;  . SEPTOPLASTY N/A 09/07/2016   Procedure: SEPTOPLASTY;  Surgeon: Linus Salmonshapman McQueen, MD;  [facility];  Service: ENT;  Laterality: N/A;  . TONSILLECTOMY AND ADENOIDECTOMY    . WISDOM TOOTH EXTRACTION      Social History   Social History  . Marital status: Married    Spouse name: N/A  . Number of children: 0  . Years of education: N/A   Occupational History  . CNA/ CPT    Social History Main Topics  . Smoking status: Never Smoker  . Smokeless tobacco: Never Used  . Alcohol use 0.0 oz/week     Comment: occasionally  . Drug use: No  . Sexual activity: Yes   Other Topics Concern  . Not on file   Social History Narrative  . No narrative on file    Family History  Problem Relation Age of Onset  . Arthritis Mother   . Ovarian cancer Mother   . Hyperlipidemia Mother   .  Hypertension Mother   . Irritable bowel syndrome Mother   . Arthritis Father   . Hypertension Father   . Asthma Father   . Asthma Sister   . Arthritis Paternal Grandmother     Allergies  Allergen Reactions  . Omeprazole Shortness Of Breath  . Penicillins Shortness Of Breath  . Shellfish Allergy Shortness Of Breath  . Iodine Itching and Swelling  . Soap Itching    ANYTHING WITH FRAGRANCE    Medication list reviewed and updated in  full in [facility].  ROS: GEN: Acute illness details above GI: Tolerating PO intake GU: maintaining adequate hydration and urination Pulm: No SOB Interactive and getting along well at home.  Otherwise, ROS is as per the HPI.  Objective:   BP 102/78   Pulse (!) 101   Temp 98.5 F (36.9 C) (Oral)   Ht 5\' 6"  (1.676 m)   Wt 250 lb 8 oz (113.6 kg)   LMP 03/14/2017   BMI 40.43 kg/m    GEN: A and O x 3. WDWN. NAD.    ENT: Nose clear, ext NML.  No LAD.  No JVD.  TM's clear. Oropharynx clear.  PULM: Normal WOB, no distress. No crackles, wheezes, rhonchi. CV: RRR, no M/G/R, No rubs, No JVD.   EXT: warm and well-perfused, No c/c/e. PSYCH: Pleasant and conversant.   Laboratory and Imaging Data: No results found.  Assessment and Plan:   Acute bronchitis, unspecified organism  At this point, we reviewed supportive care. Given the length of time and risk factors, treat with medications below. Reportedly wheezing, but none now. Given her IUI earlier in the week, I'm to hold off on any sterodis  Follow-up: No Follow-up on file.  New Prescriptions   DOXYCYCLINE (VIBRA-TABS) 100 MG TABLET    Take 1 tablet (100 mg total) by mouth 2 (two) times daily.   Modified Medications   No medications on file   No orders of the defined types were placed in this encounter.   Signed,  Elpidio Galea. Caren Garske, MD   Patient's Medications  New Prescriptions   DOXYCYCLINE (VIBRA-TABS) 100 MG TABLET    Take 1 tablet (100 mg total) by mouth 2 (two) times daily.  Previous Medications   BUTALBITAL-ACETAMINOPHEN-CAFFEINE (FIORICET, ESGIC) 50-325-40 MG TABLET    Take 1 tablet by mouth 2 (two) times daily as needed for headache.   CETIRIZINE (ZYRTEC) 10 MG TABLET    Take 10 mg by mouth daily.   CLOMIPHENE CITRATE (CLOMID PO)    Take 200 mg by mouth. Days 5 through 9   LEVOCETIRIZINE (XYZAL) 5 MG TABLET    TAKE ONE TABLET BY MOUTH EACH EVENING   MONTELUKAST (SINGULAIR) 10 MG TABLET    TAKE ONE  TABLET BY MOUTH EVERY NIGHT AT BEDTIME   MULTIVITAMIN-IRON-MINERALS-FOLIC ACID (CENTRUM) CHEWABLE TABLET    Chew 1 tablet by mouth daily.   OVIDREL 250 MCG/0.5ML INJECTION      Modified Medications   No medications on file  Discontinued Medications   OXYCODONE-ACETAMINOPHEN (ROXICET) 5-325 MG TABLET    Take 1-2 tablets by mouth every 4 (four) hours as needed for severe pain.   PROMETHAZINE (PHENERGAN) 25 MG TABLET    Take 1 tablet (25 mg total) by mouth every 8 (eight) hours as needed for nausea or vomiting.   SULFAMETHOXAZOLE-TRIMETHOPRIM (BACTRIM DS,SEPTRA DS) 800-160 MG TABLET    Take 1 tablet by mouth 2 (two) times daily.   SUMATRIPTAN (IMITREX) 100 MG TABLET  Take 1 tablet (100 mg total) by mouth every 2 (two) hours as needed for migraine. May repeat in 2 hours if headache persists or recurs.

## 2017-03-21 ENCOUNTER — Other Ambulatory Visit: Payer: Self-pay | Admitting: Internal Medicine

## 2017-03-29 ENCOUNTER — Other Ambulatory Visit: Payer: Self-pay | Admitting: Internal Medicine

## 2017-03-29 DIAGNOSIS — M542 Cervicalgia: Secondary | ICD-10-CM

## 2017-03-29 DIAGNOSIS — M546 Pain in thoracic spine: Secondary | ICD-10-CM

## 2017-06-21 ENCOUNTER — Other Ambulatory Visit: Payer: Self-pay | Admitting: Internal Medicine

## 2017-06-21 DIAGNOSIS — M542 Cervicalgia: Secondary | ICD-10-CM

## 2017-06-21 DIAGNOSIS — M546 Pain in thoracic spine: Secondary | ICD-10-CM

## 2017-06-21 NOTE — Telephone Encounter (Signed)
Pt called about the status of her medication refill request.  She said a request was sent over on Monday.  She only has two days left of the Singular.  She is just looking for a status on the refill request.  Can you please call her to review.

## 2017-06-23 NOTE — Telephone Encounter (Signed)
Pt is calling back asking why she only received 30 days on her rxs refilled yesterday. She is asking if she is due for an offce visit. Pt can be reached at [phone].

## 2017-06-24 NOTE — Telephone Encounter (Signed)
Spoke to pt and scheduled CPE 

## 2017-06-24 NOTE — Telephone Encounter (Signed)
She was probably only given a 30 day supply because someone filled it in my abscense, I'm assuming. However, she is due for her physical exam.

## 2017-07-03 ENCOUNTER — Encounter: Payer: Self-pay | Admitting: Emergency Medicine

## 2017-07-03 ENCOUNTER — Emergency Department
Admission: EM | Admit: 2017-07-03 | Discharge: 2017-07-03 | Disposition: A | Discharge status: Home / Self Care | Payer: PRIVATE HEALTH INSURANCE | Attending: Emergency Medicine | Admitting: Emergency Medicine

## 2017-07-03 DIAGNOSIS — R112 Nausea with vomiting, unspecified: Secondary | ICD-10-CM

## 2017-07-03 DIAGNOSIS — O9989 Other specified diseases and conditions complicating pregnancy, childbirth and the puerperium: Secondary | ICD-10-CM | POA: Insufficient documentation

## 2017-07-03 DIAGNOSIS — Z3A01 Less than 8 weeks gestation of pregnancy: Secondary | ICD-10-CM | POA: Diagnosis not present

## 2017-07-03 DIAGNOSIS — R51 Headache: Secondary | ICD-10-CM | POA: Insufficient documentation

## 2017-07-03 DIAGNOSIS — R197 Diarrhea, unspecified: Secondary | ICD-10-CM | POA: Diagnosis not present

## 2017-07-03 DIAGNOSIS — O218 Other vomiting complicating pregnancy: Secondary | ICD-10-CM | POA: Insufficient documentation

## 2017-07-03 DIAGNOSIS — R519 Headache, unspecified: Secondary | ICD-10-CM

## 2017-07-03 LAB — COMPREHENSIVE METABOLIC PANEL
ALBUMIN: 3.8 g/dL (ref 3.5–5.0)
ALK PHOS: 47 U/L (ref 38–126)
ALT: 22 U/L (ref 14–54)
AST: 27 U/L (ref 15–41)
Anion gap: 2 — ABNORMAL LOW (ref 5–15)
BILIRUBIN TOTAL: 0.9 mg/dL (ref 0.3–1.2)
BUN: 7 mg/dL (ref 6–20)
CO2: 23 mmol/L (ref 22–32)
CREATININE: 0.72 mg/dL (ref 0.44–1.00)
Calcium: 9 mg/dL (ref 8.9–10.3)
Chloride: 106 mmol/L (ref 101–111)
GFR calc Af Amer: >60 mL/min (ref >60)
GLUCOSE: 116 mg/dL — AB (ref 65–99)
POTASSIUM: 4.4 mmol/L (ref 3.5–5.1)
Sodium: 131 mmol/L — ABNORMAL LOW (ref 135–145)
TOTAL PROTEIN: 7.1 g/dL (ref 6.5–8.1)

## 2017-07-03 LAB — URINALYSIS, COMPLETE (UACMP) WITH MICROSCOPIC
Bacteria, UA: NONE SEEN
Bilirubin Urine: NEGATIVE
GLUCOSE, UA: NEGATIVE mg/dL
HGB URINE DIPSTICK: NEGATIVE
Ketones, ur: 5 mg/dL — AB
Leukocytes, UA: NEGATIVE
NITRITE: NEGATIVE
PH: 7 (ref 5.0–8.0)
Protein, ur: NEGATIVE mg/dL
SPECIFIC GRAVITY, URINE: 1.018 (ref 1.005–1.030)

## 2017-07-03 LAB — CBC
HCT: 38.8 % (ref 35.0–47.0)
HEMOGLOBIN: 13 g/dL (ref 12.0–16.0)
MCH: 28.9 pg (ref 26.0–34.0)
MCHC: 33.4 g/dL (ref 32.0–36.0)
MCV: 86.7 fL (ref 80.0–100.0)
PLATELETS: 275 10*3/uL (ref 150–440)
RBC: 4.48 MIL/uL (ref 3.80–5.20)
RDW: 13.9 % (ref 11.5–14.5)
WBC: 12.2 10*3/uL — ABNORMAL HIGH (ref 3.6–11.0)

## 2017-07-03 LAB — POCT PREGNANCY, URINE: Preg Test, Ur: POSITIVE — AB

## 2017-07-03 LAB — LIPASE, BLOOD: Lipase: 23 U/L (ref 11–51)

## 2017-07-03 LAB — INFLUENZA PANEL BY PCR (TYPE A & B)
INFLAPCR: NEGATIVE
INFLBPCR: NEGATIVE

## 2017-07-03 LAB — HCG, QUANTITATIVE, PREGNANCY: hCG, Beta Chain, Quant, S: 19 m[IU]/mL — ABNORMAL HIGH (ref <5)

## 2017-07-03 MED ORDER — ONDANSETRON 4 MG PO TBDP: 4.0000 mg | ORAL_TABLET | Freq: Three times a day (TID) | ORAL | 0 refills | Status: DC | PRN

## 2017-07-03 MED ORDER — SODIUM CHLORIDE 0.9 % IV BOLUS (SEPSIS)
1000.0000 mL | Freq: Once | INTRAVENOUS | Status: AC
  Administered 2017-07-03: 1000 mL via INTRAVENOUS

## 2017-07-03 MED ORDER — ONDANSETRON 4 MG PO TBDP
4.0000 mg | ORAL_TABLET | Freq: Once | ORAL | Status: AC | PRN
  Administered 2017-07-03: 4 mg via ORAL
  Filled 2017-07-03: qty 1

## 2017-07-03 MED ORDER — VITAMIN B-6 50 MG PO TABS: 50.0000 mg | ORAL_TABLET | Freq: Three times a day (TID) | ORAL | 0 refills | Status: DC | PRN

## 2017-07-03 MED ORDER — ONDANSETRON HCL 4 MG/2ML IJ SOLN
4.0000 mg | Freq: Once | INTRAMUSCULAR | Status: AC
  Administered 2017-07-03: 4 mg via INTRAVENOUS
  Filled 2017-07-03: qty 2

## 2017-07-03 NOTE — ED Triage Notes (Signed)
Patient presents to the ED with diarrhea x 6 and vomiting x 6 today.  Patient states, "everything is yellow bile, I'm really dehydated."  Patient reports upper abdominal pain.  Patient appears uncomfortable in triage.

## 2017-07-03 NOTE — ED Notes (Signed)
Pt observed in waiting room by RN drinking soda. Pt in NAD at this time. Will continue to monitor

## 2017-07-03 NOTE — Discharge Instructions (Signed)
CONGRALUTIONS on your pregnancy!  Return to the emergency department if you develop severe pain, lightheadedness or fainting, fever, vaginal bleeding, or any other symptoms concerning to you.

## 2017-07-03 NOTE — ED Provider Notes (Signed)
[facility] Emergency Department Provider Note  ____________________________________________  Time seen: Approximately 3:45 PM  I have reviewed the triage vital signs and the nursing notes.   HISTORY  Chief Complaint Abdominal Pain; Emesis; and Diarrhea    HPI Mikayla Briggs is a 36 y.o. 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 The patient reports that since this morning she has had multiple episodes of vomiting and nonbloody diarrhea. She is not had any associated abdominal pain, fever or chills, urinary symptoms or change in vaginal discharge or vaginal bleeding.No lightheadedness or syncope. The patient has had a headache which she treated with 1 g of Tylenol. She took 1 tablet of Imodium for her diarrhea.  Her husband had significant diarrhea 6 days ago, and the patient works in a health care setting.   Past Medical History:  Diagnosis Date  . 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 Migraines   . 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 Seasonal allergies   . Wears contact lenses     Patient Active Problem List   Diagnosis Date Noted  . Environmental allergies 01/07/2016  . Anxiety 10/24/2015  . Tremor 10/24/2015  . Severe obesity (BMI >= 40) (HCC) 04/10/2014    Past Surgical History:  Procedure Laterality Date  . CHOLECYSTECTOMY  11/2003  . IMAGE GUIDED SINUS SURGERY N/A 09/07/2016   Procedure: IMAGE GUIDED SINUS SURGERY;  Surgeon: Linus Salmons, MD;  [facility];  Service: ENT;  Laterality: N/A;  STYKER GAVE DISK TO CECE 12-20 kp  . KNEE ARTHROSCOPY Left 2000  . KNEE ARTHROSCOPY WITH MEDIAL MENISECTOMY Right 10/10/2014   Procedure: RIGHT KNEE ARTHROSCOPY; RESECTION PLICA; CHONDROPLASTY, AND PARTIAL MEDIAL  MENISCECTOMY;  Surgeon: Loreta Ave, MD;  Location: [facility];  Service: Orthopedics;  Laterality: Right;  . MAXILLARY ANTROSTOMY Bilateral 09/07/2016   Procedure: MAXILLARY ANTROSTOMY WITH TISSUE REMOVAL;  Surgeon: Linus Salmons, MD;  [facility];  Service: ENT;  Laterality: Bilateral;  . NASAL TURBINATE REDUCTION Bilateral 09/07/2016   Procedure: TURBINATE REDUCTION/SUBMUCOSAL RESECTION;  Surgeon: Linus Salmons, MD;  Location: [facility];  Service: ENT;  Laterality: Bilateral;  . SEPTOPLASTY N/A 09/07/2016   Procedure: SEPTOPLASTY;  Surgeon: Linus Salmons, MD;  Location: [facility];  Service: ENT;  Laterality: N/A;  . TONSILLECTOMY AND ADENOIDECTOMY    . WISDOM TOOTH EXTRACTION      Current Outpatient Rx  . Order #: [id number] Class: Historical Med  . Order #: [id number] Class: Historical Med  . Order #: [id number] Class: Historical Med  . Order #: [id number] Class: Normal  . Order #: [id number] Class: Normal  . Order #: [id number] Class: Historical Med  . Order #: [id number] Class: Print  . Order #: [id number] Class: Historical Med  . Order #: [id number] Class: Print    Allergies Omeprazole; Penicillins; Shellfish allergy; Iodine; and Soap  Family History  Problem Relation Age of Onset  . Arthritis Mother   . Ovarian cancer Mother   . Hyperlipidemia Mother   . Hypertension Mother   . Irritable bowel syndrome Mother   . Arthritis Father   . Hypertension Father   . Asthma Father   . Asthma Sister   . Arthritis Paternal Grandmother     Social History Social History  Substance Use Topics  . Smoking status: Never Smoker  . Smokeless  tobacco: Never Used  . Alcohol use 0.0 oz/week     Comment: occasionally    Review of Systems Constitutional: No fever/chills. No lightheadedness or syncope. Eyes: No visual changes. ENT: No sore throat. No congestion or rhinorrhea. Cardiovascular: Denies chest pain. Denies  palpitations. Respiratory: Denies shortness of breath.  No cough. Gastrointestinal: No abdominal pain.  Positive nausea, positive vomiting.  Positive diarrhea.  No constipation. Genitourinary: Negative for dysuria. No change in vaginal discharge. No vaginal bleeding. Musculoskeletal: Negative for back pain. Skin: Negative for rash. Neurological: Positive for headaches. No focal numbness, tingling or weakness.     ____________________________________________   PHYSICAL EXAM:  VITAL SIGNS: ED Triage Vitals  Enc Vitals Group     BP 07/03/17 1321 125/79     Pulse Rate 07/03/17 1321 (!) 112     Resp 07/03/17 1321 20     Temp 07/03/17 1321 98.9 F (37.2 C)     Temp Source 07/03/17 1321 Oral     SpO2 07/03/17 1321 98 %     Weight 07/03/17 1317 257 lb (116.6 kg)     Height 07/03/17 1317 5\' 7"  (1.702 m)     Head Circumference --      Peak Flow --      Pain Score 07/03/17 1316 9     Pain Loc --      Pain Edu? --      Excl. in GC? --     Constitutional: Alert and oriented. Well appearing and in no acute distress. Answers questions appropriately. Eyes: Conjunctivae are normal.  EOMI. No scleral icterus. Head: Atraumatic. Nose: No congestion/rhinnorhea. Mouth/Throat: Mucous membranes are mildly dry.  Neck: No stridor.  Supple.   Cardiovascular: Normal rate, regular rhythm. No murmurs, rubs or gallops.  Respiratory: Normal respiratory effort.  No accessory muscle use or retractions. Lungs CTAB.  No wheezes, rales or ronchi. Gastrointestinal: Obese. Soft, nontender and nondistended.  No guarding or rebound.  No peritoneal signs. GU: Deferred as the patient is not having any symptoms  Musculoskeletal: No LE edema.  Neurologic:  A&Ox3.  Speech is clear.  Face and smile are symmetric.  EOMI.  Moves all extremities well. Skin:  Skin is warm, dry and intact. No rash noted. Psychiatric: Mood and affect are normal. Speech and behavior are normal.  Normal  judgement.  ____________________________________________   LABS (all labs ordered are listed, but only abnormal results are displayed)  Labs Reviewed  COMPREHENSIVE METABOLIC PANEL - Abnormal; Notable for the following:       Result Value   Sodium 131 (*)    Glucose, Bld 116 (*)    Anion gap 2 (*)    All other components within normal limits  CBC - Abnormal; Notable for the following:    WBC 12.2 (*)    All other components within normal limits  URINALYSIS, COMPLETE (UACMP) WITH MICROSCOPIC - Abnormal; Notable for the following:    Color, Urine YELLOW (*)    APPearance CLEAR (*)    Ketones, ur 5 (*)    Squamous Epithelial / LPF 0-5 (*)    All other components within normal limits  HCG, QUANTITATIVE, PREGNANCY - Abnormal; Notable for the following:    hCG, Beta Chain, Quant, S 19 (*)    All other components within normal limits  POCT PREGNANCY, URINE - Abnormal; Notable for the following:    Preg Test, Ur POSITIVE (*)    All other components within normal limits  LIPASE, BLOOD  INFLUENZA PANEL BY  PCR (TYPE A & B)  POC URINE PREG, ED   ____________________________________________  EKG  Not indicated ____________________________________________  RADIOLOGY  No results found.  ____________________________________________   PROCEDURES  Procedure(s) performed: None  Procedures  Critical Care performed: No ____________________________________________   INITIAL IMPRESSION / ASSESSMENT AND PLAN / ED COURSE  Pertinent labs & imaging results that were available during my care of the patient were reviewed by me and considered in my medical decision making (see chart for details).  36 y.o. 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 Overall, the patient is hemodynamically stable and afebrile. Her abdominal exam is reassuring and there is no evidence for acute intra-abdominal surgical  pathology. 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 We'll check her for influenza given her pregnancy. Given that she is not having any GU symptoms and her hCG is only 19, no ultrasound is indicated as early pregnancy would not be seen with this lower quadrant and at timed intercourse which was only several days ago. The patient is following with an infertility specialist in [place], and will be in contact with him today for follow-up instructions. Here, I will treat the patient with intravenous fluids to help her hyponatremia, headache, and symptoms. We'll also give her an antiemetic and reevaluate her for final disposition. If the patient is feeling better and able to tolerate liquids, plan to discharge her home with close PMD as well as gynecology follow-up.  ----------------------------------------- 5:52 PM on 07/03/2017 -----------------------------------------  The patient's influenza testing is negative. She is feeling better at this time. She is not had any vomiting here and is able to tolerate clear liquid. I'll plan to discharge the patient home with close gynecology and PMD follow-up. Return precautions were discussed ____________________________________________  FINAL CLINICAL IMPRESSION(S) / ED DIAGNOSES  Final diagnoses:  Less than [redacted] weeks gestation of pregnancy  Nausea vomiting and diarrhea  Acute nonintractable headache, unspecified headache type         NEW MEDICATIONS STARTED DURING THIS VISIT:  New Prescriptions   ONDANSETRON (ZOFRAN ODT) 4 MG DISINTEGRATING TABLET    Take 1 tablet (4 mg total) by mouth every 8 (eight) hours as needed for nausea or vomiting.   PYRIDOXINE (VITAMIN B-6) 50 MG TABLET    Take 1 tablet (50 mg total) by mouth every 8 (eight) hours as needed.      Rockne Menghini, MD 07/03/17 [phone]

## 2017-07-05 ENCOUNTER — Ambulatory Visit (INDEPENDENT_AMBULATORY_CARE_PROVIDER_SITE_OTHER): Payer: PRIVATE HEALTH INSURANCE | Admitting: Internal Medicine

## 2017-07-05 ENCOUNTER — Encounter: Payer: Self-pay | Admitting: Internal Medicine

## 2017-07-05 VITALS — BP 114/78 | HR 79 | Temp 98.0°F | Ht 65.5 in | Wt 257.0 lb

## 2017-07-05 DIAGNOSIS — Z9109 Other allergy status, other than to drugs and biological substances: Secondary | ICD-10-CM | POA: Diagnosis not present

## 2017-07-05 DIAGNOSIS — Z3201 Encounter for pregnancy test, result positive: Secondary | ICD-10-CM

## 2017-07-05 DIAGNOSIS — Z0000 Encounter for general adult medical examination without abnormal findings: Secondary | ICD-10-CM | POA: Diagnosis not present

## 2017-07-05 LAB — LIPID PANEL
CHOLESTEROL: 144 mg/dL (ref 0–200)
HDL: 42 mg/dL (ref >39.00)
LDL Cholesterol: 80 mg/dL (ref 0–99)
NonHDL: 102.34
TRIGLYCERIDES: 113 mg/dL (ref 0.0–149.0)
Total CHOL/HDL Ratio: 3
VLDL: 22.6 mg/dL (ref 0.0–40.0)

## 2017-07-05 LAB — HCG, QUANTITATIVE, PREGNANCY: QUANTITATIVE HCG: 4.69 m[IU]/mL

## 2017-07-05 LAB — HEMOGLOBIN A1C: Hgb A1c MFr Bld: 5.5 % (ref 4.6–6.5)

## 2017-07-05 LAB — TSH: TSH: 1.36 u[IU]/mL (ref 0.35–4.50)

## 2017-07-05 MED ORDER — LEVOCETIRIZINE DIHYDROCHLORIDE 5 MG PO TABS: ORAL_TABLET | ORAL | 11 refills | Status: DC

## 2017-07-05 MED ORDER — MONTELUKAST SODIUM 10 MG PO TABS: 10.0000 mg | ORAL_TABLET | Freq: Every day | ORAL | 11 refills | Status: DC

## 2017-07-06 NOTE — Patient Instructions (Signed)

## 2017-07-06 NOTE — Assessment & Plan Note (Signed)
Controlled on Xyzal and Singulair, refilled today

## 2017-07-06 NOTE — Progress Notes (Signed)
Subjective:    Patient ID: Mikayla Briggs, female    DOB: 06-Mar-1981, 36 y.o.   MRN: [id number]  HPI  Pt presents to the clinic today for her annual exam. She reports she was in the ER Saturday. They did a urine HCG which was positive. They did a serum HCG which was low but positive. She is undergoing fertility treatments and recently had an injection to make her ovulate, which could cause a false positive result. She would like the serum HCG rechecked today.  Seasonal Allergies: Chronic. She reports she has had sinus surgery in the last year .She is taking Xyzal, and Singulair as needed with good rleief .  Flu: 06/2017 Tetanus: 2015, at work Pap Smear: 05/2017, [facility] Dentist: biannually  Diet: She does eat meat. She consumes fruits and veggies. She occassionally eats fried foods. She drinks mostly water. Exercise: None  Review of Systems      Past Medical History:  Diagnosis Date  . 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 Migraines   . 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 Seasonal allergies   . Wears contact lenses     Current Outpatient Prescriptions  Medication Sig Dispense Refill  . butalbital-acetaminophen-caffeine (FIORICET, ESGIC) 50-325-40 MG tablet Take 1 tablet by mouth 2 (two) times daily as needed for headache.    . cetirizine (ZYRTEC) 10 MG tablet Take 10 mg by mouth daily.    . ClomiPHENE Citrate (CLOMID PO) Take 200 mg by mouth. Days 5 through 9    . levocetirizine (XYZAL) 5 MG tablet TAKE ONE TABLET BY MOUTH EACH EVENING 30 tablet 11  . montelukast (SINGULAIR) 10 MG tablet Take 1 tablet (10 mg total) by mouth at bedtime. 30 tablet 11  . multivitamin-iron-minerals-folic acid (CENTRUM) chewable tablet Chew 1 tablet by mouth daily.    . ondansetron (ZOFRAN ODT) 4 MG disintegrating tablet Take 1 tablet (4 mg total) by mouth every 8 (eight)  hours as needed for nausea or vomiting. 20 tablet 0  . OVIDREL 250 MCG/0.5ML injection   0  . pyridOXINE (VITAMIN B-6) 50 MG tablet Take 1 tablet (50 mg total) by mouth every 8 (eight) hours as needed. 20 tablet 0   No current facility-administered medications for this visit.     Allergies  Allergen Reactions  . Omeprazole Shortness Of Breath  . Penicillins Shortness Of Breath  . Shellfish Allergy Shortness Of Breath  . Iodine Itching and Swelling  . Soap Itching    ANYTHING WITH FRAGRANCE    Family History  Problem Relation Age of Onset  . Arthritis Mother   . Ovarian cancer Mother   . Hyperlipidemia Mother   . Hypertension Mother   . Irritable bowel syndrome Mother   . Arthritis Father   . Hypertension Father   . Asthma Father   . Asthma Sister   . Arthritis Paternal Grandmother     Social History   Social History  . Marital status: Married    Spouse name: N/A  . Number of children: 0  . Years of education: N/A   Occupational History  . CNA/ CPT    Social History Main Topics  . Smoking status: Never Smoker  . Smokeless tobacco: Never Used  . Alcohol use 0.0 oz/week     Comment: occasionally  . Drug use: No  . Sexual activity: Yes   Other  Topics Concern  . Not on file   Social History Narrative  . No narrative on file     Constitutional: Pt reports weight gain. Denies fever, malaise, fatigue, headache.  HEENT: Denies eye pain, eye redness, ear pain, ringing in the ears, wax buildup, runny nose, nasal congestion, bloody nose, or sore throat. Respiratory: Denies difficulty breathing, shortness of breath, cough or sputum production.   Cardiovascular: Denies chest pain, chest tightness, palpitations or swelling in the hands or feet.  Gastrointestinal: Denies abdominal pain, bloating, constipation, diarrhea or blood in the stool.  GU: Denies urgency, frequency, pain with urination, burning sensation, blood in urine, odor or discharge. Musculoskeletal:  Denies decrease in range of motion, difficulty with gait, muscle pain or joint pain and swelling.  Skin: Denies redness, rashes, lesions or ulcercations.  Neurological: Denies dizziness, difficulty with memory, difficulty with speech or problems with balance and coordination.  Psych: Denies anxiety, depression, SI/HI.  No other specific complaints in a complete review of systems (except as listed in HPI above).  Objective:   Physical Exam   BP 114/78   Pulse 79   Temp 98 F (36.7 C) (Oral)   Ht 5' 5.5" (1.664 m)   Wt 257 lb (116.6 kg)   LMP 06/18/2017 (Exact Date)   SpO2 98%   BMI 42.12 kg/m  Wt Readings from Last 3 Encounters:  07/05/17 257 lb (116.6 kg)  07/03/17 257 lb (116.6 kg)  03/17/17 250 lb 8 oz (113.6 kg)    General: Appears her stated age, obese in NAD. Skin: Warm, dry and intact.  HEENT: Head: normal shape and size; Eyes: sclera white, no icterus, conjunctiva pink, PERRLA and EOMs intact; Ears: Tm's gray and intact, normal light reflex; Nose: mucosa pink and moist, septum midline; Throat/Mouth: Teeth present, mucosa pink and moist, no exudate, lesions or ulcerations noted.  Neck:  Neck supple, trachea midline. No masses, lumps or thyromegaly present.  Cardiovascular: Normal rate and rhythm. S1,S2 noted.  No murmur, rubs or gallops noted. No JVD or BLE edema.  Pulmonary/Chest: Normal effort and positive vesicular breath sounds. No respiratory distress. No wheezes, rales or ronchi noted.  Abdomen: Soft and nontender. Normal bowel sounds. No distention or masses noted. Liver, spleen and kidneys non palpable. Musculoskeletal: Strength 5/5 BUE/BLE. No difficulty with gait.  Neurological: Alert and oriented. Cranial nerves II-XII grossly intact. Coordination normal.  Psychiatric: Mood and affect normal. Behavior is normal. Judgment and thought content normal.    BMET    Component Value Date/Time   NA 131 (L) 07/03/2017 1314   NA 137 01/02/2014 1152   K 4.4 07/03/2017  1314   K 3.9 01/02/2014 1152   CL 106 07/03/2017 1314   CL 102 01/02/2014 1152   CO2 23 07/03/2017 1314   CO2 26 01/02/2014 1152   GLUCOSE 116 (H) 07/03/2017 1314   GLUCOSE 105 (H) 01/02/2014 1152   BUN 7 07/03/2017 1314   BUN 9 01/02/2014 1152   CREATININE 0.72 07/03/2017 1314   CREATININE 0.81 01/02/2014 1152   CALCIUM 9.0 07/03/2017 1314   CALCIUM 8.8 01/02/2014 1152   GFRNONAA >60 07/03/2017 1314   GFRNONAA >60 01/02/2014 1152   GFRAA >60 07/03/2017 1314   GFRAA >60 01/02/2014 1152    Lipid Panel     Component Value Date/Time   CHOL 144 07/05/2017 1306   TRIG 113.0 07/05/2017 1306   HDL 42.00 07/05/2017 1306   CHOLHDL 3 07/05/2017 1306   VLDL 22.6 07/05/2017 1306   LDLCALC  80 07/05/2017 1306    CBC    Component Value Date/Time   WBC 12.2 (H) 07/03/2017 1314   RBC 4.48 07/03/2017 1314   HGB 13.0 07/03/2017 1314   HGB 14.5 01/02/2014 1152   HCT 38.8 07/03/2017 1314   HCT 43.6 01/02/2014 1152   PLT 275 07/03/2017 1314   PLT 280 01/02/2014 1152   MCV 86.7 07/03/2017 1314   MCV 90 01/02/2014 1152   MCH 28.9 07/03/2017 1314   MCHC 33.4 07/03/2017 1314   RDW 13.9 07/03/2017 1314   RDW 12.7 01/02/2014 1152   LYMPHSABS 2.7 07/20/2016 1101   LYMPHSABS 2.4 01/02/2014 1152   MONOABS 0.7 07/20/2016 1101   MONOABS 0.7 01/02/2014 1152   EOSABS 0.6 07/20/2016 1101   EOSABS 0.4 01/02/2014 1152   BASOSABS 0.0 07/20/2016 1101   BASOSABS 0.1 01/02/2014 1152    Hgb A1C Lab Results  Component Value Date   HGBA1C 5.5 07/05/2017           Assessment & Plan:   Preventative Health Maintenance:  Flu shot UTD She reports her tetanus vaccine is UTD Pap smear UTD Encouraged her to consume a balanced diet and exercise regimen Advised her to see an eye doctor and dentist annually Will check CBC< CMET, Lipid, TSH, and A1C today  Positive Pregnancy Test:  Serum HCG today Follow up with your infertility specialist  RTC in 1 year, sooner if needed Nicki ReaperBAITY, Sheliah Fiorillo,  NP

## 2017-08-02 ENCOUNTER — Encounter: Payer: Self-pay | Admitting: Internal Medicine

## 2017-08-02 ENCOUNTER — Ambulatory Visit (INDEPENDENT_AMBULATORY_CARE_PROVIDER_SITE_OTHER): Payer: PRIVATE HEALTH INSURANCE | Admitting: Internal Medicine

## 2017-08-02 VITALS — BP 128/72 | HR 92 | Temp 98.5°F | Wt 256.2 lb

## 2017-08-02 DIAGNOSIS — R490 Dysphonia: Secondary | ICD-10-CM | POA: Diagnosis not present

## 2017-08-02 MED ORDER — METHYLPREDNISOLONE ACETATE 40 MG/ML IJ SUSP
80.0000 mg | Freq: Once | INTRAMUSCULAR | Status: AC
  Administered 2017-08-02: 80 mg via INTRAMUSCULAR

## 2017-08-02 NOTE — Addendum Note (Signed)
Addended by: Desmond DikeKNIGHT, Raaga Maeder H on: 08/02/2017 09:52 AM   Modules accepted: Orders

## 2017-08-02 NOTE — Patient Instructions (Signed)
Laryngitis Laryngitis is swelling (inflammation) of your vocal cords. This causes hoarseness, coughing, loss of voice, sore throat, or a dry throat. When your vocal cords are inflamed, your voice sounds different. Laryngitis can be temporary (acute) or long-term (chronic). Most cases of acute laryngitis improve with time. Chronic laryngitis is laryngitis that lasts for more than three weeks. Follow these instructions at home:  Drink enough fluid to keep your pee (urine) clear or pale yellow.  Breathe in moist air. Use a humidifier if you live in a dry climate.  Take medicines only as told by your doctor.  Do not smoke cigarettes or electronic cigarettes. If you need help quitting, ask your doctor.  Talk as little as possible. Also avoid whispering, which can cause vocal strain.  Write instead of talking. Do this until your voice is back to normal. Contact a doctor if:  You have a fever.  Your pain is worse.  You have trouble swallowing. Get help right away if:  You cough up blood.  You have trouble breathing. This information is not intended to replace advice given to you by your health care provider. Make sure you discuss any questions you have with your health care provider. Document Released: 08/19/2011 Document Revised: 02/05/2016 Document Reviewed: 02/12/2014 Elsevier Interactive Patient Education  2018 Elsevier Inc.  

## 2017-08-02 NOTE — Progress Notes (Signed)
Subjective:    Patient ID: Mikayla Briggs, female    DOB: 06-25-1981, 36 y.o.   MRN: [id number]  HPI  Pt presents to the clinic today with c/o hoarseness. She reports this started 1 week ago. She denies runny nose, nasal congestion, ear pain sore throat or cough. She denies fever, chills or body aches. She has not tried anything OTC for her symptoms. She has not had sick contacts. She does have allergies, and is taking Zyrtec, Xyzal and Singulair as prescribed.  Review of Systems      Past Medical History:  Diagnosis Date  . 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 Migraines   . 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 Seasonal allergies   . Wears contact lenses     Current Outpatient Medications  Medication Sig Dispense Refill  . butalbital-acetaminophen-caffeine (FIORICET, ESGIC) 50-325-40 MG tablet Take 1 tablet by mouth 2 (two) times daily as needed for headache.    . cetirizine (ZYRTEC) 10 MG tablet Take 10 mg by mouth daily.    Marland Kitchen. levocetirizine (XYZAL) 5 MG tablet TAKE ONE TABLET BY MOUTH EACH EVENING 30 tablet 11  . montelukast (SINGULAIR) 10 MG tablet Take 1 tablet (10 mg total) by mouth at bedtime. 30 tablet 11  . multivitamin-iron-minerals-folic acid (CENTRUM) chewable tablet Chew 1 tablet by mouth daily.    . ondansetron (ZOFRAN ODT) 4 MG disintegrating tablet Take 1 tablet (4 mg total) by mouth every 8 (eight) hours as needed for nausea or vomiting. 20 tablet 0  . pyridOXINE (VITAMIN B-6) 50 MG tablet Take 1 tablet (50 mg total) by mouth every 8 (eight) hours as needed. 20 tablet 0  . ClomiPHENE Citrate (CLOMID PO) Take 200 mg by mouth. Days 5 through 9    . OVIDREL 250 MCG/0.5ML injection   0   No current facility-administered medications for this visit.     Allergies  Allergen Reactions  . Omeprazole Shortness Of Breath  . Penicillins Shortness Of Breath    . Shellfish Allergy Shortness Of Breath  . Iodine Itching and Swelling  . Soap Itching    ANYTHING WITH FRAGRANCE    Family History  Problem Relation Age of Onset  . Arthritis Mother   . Ovarian cancer Mother   . Hyperlipidemia Mother   . Hypertension Mother   . Irritable bowel syndrome Mother   . Arthritis Father   . Hypertension Father   . Asthma Father   . Asthma Sister   . Arthritis Paternal Grandmother     Social History   Socioeconomic History  . Marital status: Married    Spouse name: Not on file  . Number of children: 0  . Years of education: Not on file  . Highest education level: Not on file  Social Needs  . Financial resource strain: Not on file  . Food insecurity - worry: Not on file  . Food insecurity - inability: Not on file  . Transportation needs - medical: Not on file  . Transportation needs - non-medical: Not on file  Occupational History  . Occupation: CNA/ CPT  Tobacco Use  . Smoking status: Never Smoker  . Smokeless tobacco: Never Used  Substance and Sexual Activity  . Alcohol use: Yes    Alcohol/week: 0.0 oz    Comment: occasionally  . Drug use: No  . Sexual activity: Yes  Other Topics Concern  .  Not on file  Social History Narrative  . Not on file     Constitutional: Denies fever, malaise, fatigue, headache or abrupt weight changes.  HEENT: Pt reports hoarseness. Denies eye pain, eye redness, ear pain, ringing in the ears, wax buildup, runny nose, nasal congestion, bloody nose, or sore throat. Respiratory: Denies difficulty breathing, shortness of breath, cough or sputum production.    No other specific complaints in a complete review of systems (except as listed in HPI above).  Objective:   Physical Exam   BP 128/72   Pulse 92   Temp 98.5 F (36.9 C) (Oral)   Wt 256 lb 4 oz (116.2 kg)   LMP 07/15/2017   SpO2 97%   BMI 41.99 kg/m  Wt Readings from Last 3 Encounters:  08/02/17 256 lb 4 oz (116.2 kg)  07/05/17 257 lb  (116.6 kg)  07/03/17 257 lb (116.6 kg)    General: Appears her stated age, obese in NAD. HEENT: Ears: Tm's gray and intact, normal light reflex; Throat/Mouth: Teeth present, mucosa pink and moist, + PND, no exudate, lesions or ulcerations noted.  Neck:  No adenopathy noted.  Cardiovascular: Normal rate and rhythm.  Pulmonary/Chest: Normal effort and positive vesicular breath sounds. No respiratory distress. No wheezes, rales or ronchi noted.   BMET    Component Value Date/Time   NA 131 (L) 07/03/2017 1314   NA 137 01/02/2014 1152   K 4.4 07/03/2017 1314   K 3.9 01/02/2014 1152   CL 106 07/03/2017 1314   CL 102 01/02/2014 1152   CO2 23 07/03/2017 1314   CO2 26 01/02/2014 1152   GLUCOSE 116 (H) 07/03/2017 1314   GLUCOSE 105 (H) 01/02/2014 1152   BUN 7 07/03/2017 1314   BUN 9 01/02/2014 1152   CREATININE 0.72 07/03/2017 1314   CREATININE 0.81 01/02/2014 1152   CALCIUM 9.0 07/03/2017 1314   CALCIUM 8.8 01/02/2014 1152   GFRNONAA >60 07/03/2017 1314   GFRNONAA >60 01/02/2014 1152   GFRAA >60 07/03/2017 1314   GFRAA >60 01/02/2014 1152    Lipid Panel     Component Value Date/Time   CHOL 144 07/05/2017 1306   TRIG 113.0 07/05/2017 1306   HDL 42.00 07/05/2017 1306   CHOLHDL 3 07/05/2017 1306   VLDL 22.6 07/05/2017 1306   LDLCALC 80 07/05/2017 1306    CBC    Component Value Date/Time   WBC 12.2 (H) 07/03/2017 1314   RBC 4.48 07/03/2017 1314   HGB 13.0 07/03/2017 1314   HGB 14.5 01/02/2014 1152   HCT 38.8 07/03/2017 1314   HCT 43.6 01/02/2014 1152   PLT 275 07/03/2017 1314   PLT 280 01/02/2014 1152   MCV 86.7 07/03/2017 1314   MCV 90 01/02/2014 1152   MCH 28.9 07/03/2017 1314   MCHC 33.4 07/03/2017 1314   RDW 13.9 07/03/2017 1314   RDW 12.7 01/02/2014 1152   LYMPHSABS 2.7 07/20/2016 1101   LYMPHSABS 2.4 01/02/2014 1152   MONOABS 0.7 07/20/2016 1101   MONOABS 0.7 01/02/2014 1152   EOSABS 0.6 07/20/2016 1101   EOSABS 0.4 01/02/2014 1152   BASOSABS 0.0  07/20/2016 1101   BASOSABS 0.1 01/02/2014 1152    Hgb A1C Lab Results  Component Value Date   HGBA1C 5.5 07/05/2017           Assessment & Plan:   Hoarseness:  Likely viral laryngitis 80 mg Depo IM today Continue Zyrtec, Xyzal and Singulair  Return precautions discussed Nicki ReaperBAITY, Rachna Schonberger, NP

## 2017-09-08 ENCOUNTER — Telehealth: Payer: Self-pay

## 2017-09-08 NOTE — Telephone Encounter (Signed)
No xray until evaluated.

## 2017-09-08 NOTE — Telephone Encounter (Signed)
Pt called back to check on this, and I did inform pt [facility].

## 2017-09-08 NOTE — Telephone Encounter (Signed)
Copied from CRM [phone]#[id number]. Topic: Quick Communication - See Telephone Encounter >> Sep 08, 2017  8:41 AM Herby AbrahamJohnson, Shiquita C wrote: CRM for notification. See Telephone encounter for: pt says that she was seen for laryngitis before Thanksgiving. Pt says that laryngitis went away and now has come back, pt says that she now also have rattling in chest, [facility]?   Pt does have an apt with Baity tomorrow but says that she would like to have x-ray today if possible. Please assist further    CB: [phone]    09/08/17.

## 2017-09-08 NOTE — Telephone Encounter (Signed)
Pt has appt with R Baity NP on 09/09/17 at 3 pm.

## 2017-09-09 ENCOUNTER — Ambulatory Visit: Payer: PRIVATE HEALTH INSURANCE | Admitting: Internal Medicine

## 2017-09-09 ENCOUNTER — Encounter: Payer: Self-pay | Admitting: Internal Medicine

## 2017-09-09 ENCOUNTER — Ambulatory Visit
Admission: RE | Admit: 2017-09-09 | Discharge: 2017-09-09 | Disposition: A | Discharge status: Home / Self Care | Payer: PRIVATE HEALTH INSURANCE | Source: Ambulatory Visit | Attending: Internal Medicine | Admitting: Internal Medicine

## 2017-09-09 ENCOUNTER — Telehealth: Payer: Self-pay | Admitting: Internal Medicine

## 2017-09-09 VITALS — BP 120/78 | HR 72 | Temp 98.2°F | Wt 262.0 lb

## 2017-09-09 DIAGNOSIS — R05 Cough: Secondary | ICD-10-CM

## 2017-09-09 DIAGNOSIS — R0989 Other specified symptoms and signs involving the circulatory and respiratory systems: Secondary | ICD-10-CM

## 2017-09-09 DIAGNOSIS — J040 Acute laryngitis: Secondary | ICD-10-CM

## 2017-09-09 DIAGNOSIS — R059 Cough, unspecified: Secondary | ICD-10-CM

## 2017-09-09 NOTE — Patient Instructions (Signed)
Laryngitis Laryngitis is swelling (inflammation) of your vocal cords. This causes hoarseness, coughing, loss of voice, sore throat, or a dry throat. When your vocal cords are inflamed, your voice sounds different. Laryngitis can be temporary (acute) or long-term (chronic). Most cases of acute laryngitis improve with time. Chronic laryngitis is laryngitis that lasts for more than three weeks. Follow these instructions at home:  Drink enough fluid to keep your pee (urine) clear or pale yellow.  Breathe in moist air. Use a humidifier if you live in a dry climate.  Take medicines only as told by your doctor.  Do not smoke cigarettes or electronic cigarettes. If you need help quitting, ask your doctor.  Talk as little as possible. Also avoid whispering, which can cause vocal strain.  Write instead of talking. Do this until your voice is back to normal. Contact a doctor if:  You have a fever.  Your pain is worse.  You have trouble swallowing. Get help right away if:  You cough up blood.  You have trouble breathing. This information is not intended to replace advice given to you by your health care provider. Make sure you discuss any questions you have with your health care provider. Document Released: 08/19/2011 Document Revised: 02/05/2016 Document Reviewed: 02/12/2014 Elsevier Interactive Patient Education  2018 Elsevier Inc.  

## 2017-09-09 NOTE — Progress Notes (Signed)
Subjective:    Patient ID: Mikayla SeedsLauren Michelle Thompson-Brown, female    DOB: 12/20/1980, 36 y.o.   MRN: [id number]  HPI  Pt presents to the clinic today with c/o hoarseness, cough and chest congestion. This started 3 days ago. The cough is productive of brown mucous. She feels like she is wheezing. She denies fever, chills, body aches or shortness of breath. She was seen 11/20 for viral laryngitis. She was given 80 mg Depo IM, advised to try salt water gargles and continue Zyrtec, Xyzal and Singulair. She reports her symptoms resolved, but the hoarseness returned 3 days ago. She has not tried any OTC meds for her symptoms. Her main concern is that she has had pneumonia before and she would like a chest xray today.  Review of Systems      Past Medical History:  Diagnosis Date  . 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 Migraines   . 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 Seasonal allergies   . Wears contact lenses     Current Outpatient Medications  Medication Sig Dispense Refill  . butalbital-acetaminophen-caffeine (FIORICET, ESGIC) 50-325-40 MG tablet Take 1 tablet by mouth 2 (two) times daily as needed for headache.    . cetirizine (ZYRTEC) 10 MG tablet Take 10 mg by mouth daily.    . ClomiPHENE Citrate (CLOMID PO) Take 200 mg by mouth. Days 5 through 9    . levocetirizine (XYZAL) 5 MG tablet TAKE ONE TABLET BY MOUTH EACH EVENING 30 tablet 11  . montelukast (SINGULAIR) 10 MG tablet Take 1 tablet (10 mg total) by mouth at bedtime. 30 tablet 11  . multivitamin-iron-minerals-folic acid (CENTRUM) chewable tablet Chew 1 tablet by mouth daily.    . ondansetron (ZOFRAN ODT) 4 MG disintegrating tablet Take 1 tablet (4 mg total) by mouth every 8 (eight) hours as needed for nausea or vomiting. 20 tablet 0  . OVIDREL 250 MCG/0.5ML injection   0  . pyridOXINE (VITAMIN B-6) 50 MG tablet Take 1 tablet (50  mg total) by mouth every 8 (eight) hours as needed. 20 tablet 0   No current facility-administered medications for this visit.     Allergies  Allergen Reactions  . Omeprazole Shortness Of Breath  . Penicillins Shortness Of Breath  . Shellfish Allergy Shortness Of Breath  . Iodine Itching and Swelling  . Soap Itching    ANYTHING WITH FRAGRANCE    Family History  Problem Relation Age of Onset  . Arthritis Mother   . Ovarian cancer Mother   . Hyperlipidemia Mother   . Hypertension Mother   . Irritable bowel syndrome Mother   . Arthritis Father   . Hypertension Father   . Asthma Father   . Asthma Sister   . Arthritis Paternal Grandmother     Social History   Socioeconomic History  . Marital status: Married    Spouse name: Not on file  . Number of children: 0  . Years of education: Not on file  . Highest education level: Not on file  Social Needs  . Financial resource strain: Not on file  . Food insecurity - worry: Not on file  . Food insecurity - inability: Not on file  . Transportation needs - medical: Not on file  . Transportation needs - non-medical: Not on file  Occupational History  . Occupation: CNA/ CPT  Tobacco Use  . Smoking status:  Never Smoker  . Smokeless tobacco: Never Used  Substance and Sexual Activity  . Alcohol use: Yes    Alcohol/week: 0.0 oz    Comment: occasionally  . Drug use: No  . Sexual activity: Yes  Other Topics Concern  . Not on file  Social History Narrative  . Not on file     Constitutional: Denies fever, malaise, fatigue, headache or abrupt weight changes.  HEENT: Pt reports hoarseness. Denies eye pain, eye redness, ear pain, ringing in the ears, wax buildup, runny nose, nasal congestion, bloody nose, or sore throat. Respiratory: Pt reports cough and  chest congestion. Denies difficulty breathing, shortness of breath.    No other specific complaints in a complete review of systems (except as listed in HPI above).  Objective:    Physical Exam  BP 120/78   Pulse 72   Temp 98.2 F (36.8 C) (Oral)   Wt 262 lb (118.8 kg)   LMP 09/06/2017   SpO2 99%   BMI 42.94 kg/m  Wt Readings from Last 3 Encounters:  09/09/17 262 lb (118.8 kg)  08/02/17 256 lb 4 oz (116.2 kg)  07/05/17 257 lb (116.6 kg)    General: Appears her stated age, in NAD. HEENT: Head: normal shape and size, no sinus tenderness today; Throat/Mouth: Teeth present, mucosa pink and moist, + PND, no exudate, lesions or ulcerations noted.  Neck:  No adenopathy noted. Cardiovascular: Normal rate and rhythm. S1,S2 noted.  No murmur, rubs or gallops noted.  Pulmonary/Chest: Normal effort and positive vesicular breath sounds. No respiratory distress. No wheezes, rales or ronchi noted.    BMET    Component Value Date/Time   NA 131 (L) 07/03/2017 1314   NA 137 01/02/2014 1152   K 4.4 07/03/2017 1314   K 3.9 01/02/2014 1152   CL 106 07/03/2017 1314   CL 102 01/02/2014 1152   CO2 23 07/03/2017 1314   CO2 26 01/02/2014 1152   GLUCOSE 116 (H) 07/03/2017 1314   GLUCOSE 105 (H) 01/02/2014 1152   BUN 7 07/03/2017 1314   BUN 9 01/02/2014 1152   CREATININE 0.72 07/03/2017 1314   CREATININE 0.81 01/02/2014 1152   CALCIUM 9.0 07/03/2017 1314   CALCIUM 8.8 01/02/2014 1152   GFRNONAA >60 07/03/2017 1314   GFRNONAA >60 01/02/2014 1152   GFRAA >60 07/03/2017 1314   GFRAA >60 01/02/2014 1152    Lipid Panel     Component Value Date/Time   CHOL 144 07/05/2017 1306   TRIG 113.0 07/05/2017 1306   HDL 42.00 07/05/2017 1306   CHOLHDL 3 07/05/2017 1306   VLDL 22.6 07/05/2017 1306   LDLCALC 80 07/05/2017 1306    CBC    Component Value Date/Time   WBC 12.2 (H) 07/03/2017 1314   RBC 4.48 07/03/2017 1314   HGB 13.0 07/03/2017 1314   HGB 14.5 01/02/2014 1152   HCT 38.8 07/03/2017 1314   HCT 43.6 01/02/2014 1152   PLT 275 07/03/2017 1314   PLT 280 01/02/2014 1152   MCV 86.7 07/03/2017 1314   MCV 90 01/02/2014 1152   MCH 28.9 07/03/2017 1314   MCHC  33.4 07/03/2017 1314   RDW 13.9 07/03/2017 1314   RDW 12.7 01/02/2014 1152   LYMPHSABS 2.7 07/20/2016 1101   LYMPHSABS 2.4 01/02/2014 1152   MONOABS 0.7 07/20/2016 1101   MONOABS 0.7 01/02/2014 1152   EOSABS 0.6 07/20/2016 1101   EOSABS 0.4 01/02/2014 1152   BASOSABS 0.0 07/20/2016 1101   BASOSABS 0.1 01/02/2014 1152  Hgb A1C Lab Results  Component Value Date   HGBA1C 5.5 07/05/2017            Assessment & Plan:  Laryngitis, Cough and Chest Congestion:  Likely viral Continue Zyrtec, Xyzal and Singulair today Chest xray ordered Advised her to try Mucinex 600 mg every 12 hours Delsym as needed for cough  Will follow up after chest xray, return precautions discussed Nicki Reaper, NP

## 2017-09-09 NOTE — Telephone Encounter (Signed)
[facility].  "No active cardiopulmomary disease."

## 2017-09-09 NOTE — Telephone Encounter (Signed)
Copied from CRM [phone]. Topic: Quick Communication - See Telephone Encounter >> Sep 09, 2017  4:04 PM Arlyss Gandyichardson, Bliss Behnke N, NT wrote: CRM for notification. See Telephone encounter for: Pt would like to have a call with her xray results and to see what the next steps are.  09/09/17.

## 2017-09-09 NOTE — Telephone Encounter (Signed)
Pt made aware via my chart

## 2017-09-09 NOTE — Telephone Encounter (Signed)
Pt is aware as instructed and expressed understanding 

## 2017-09-09 NOTE — Telephone Encounter (Signed)
Call pt:  Xray was negative. This is likely viral. Continue Zyrtec, Xyzal and Singulair. Delsym as needed for cough. She should let me know if symptoms persist or worsen. No need for abx at this time.

## 2018-02-17 ENCOUNTER — Ambulatory Visit: Payer: PRIVATE HEALTH INSURANCE | Admitting: Internal Medicine

## 2018-02-17 ENCOUNTER — Encounter: Payer: Self-pay | Admitting: Internal Medicine

## 2018-02-17 VITALS — BP 102/74 | HR 86 | Temp 98.7°F | Ht 65.5 in | Wt 250.0 lb

## 2018-02-17 DIAGNOSIS — R21 Rash and other nonspecific skin eruption: Secondary | ICD-10-CM

## 2018-02-17 MED ORDER — METHYLPREDNISOLONE ACETATE 40 MG/ML IJ SUSP
40.0000 mg | Freq: Once | INTRAMUSCULAR | Status: AC
  Administered 2018-02-17: 40 mg via INTRAMUSCULAR

## 2018-02-17 MED ORDER — BETAMETHASONE DIPROPIONATE 0.05 % EX CREA: TOPICAL_CREAM | Freq: Two times a day (BID) | CUTANEOUS | 0 refills | Status: DC | PRN

## 2018-02-17 NOTE — Addendum Note (Signed)
Addended by: Eual FinesBRIDGES, SHANNON P on: 02/17/2018 11:02 AM   Modules accepted: Orders

## 2018-02-17 NOTE — Assessment & Plan Note (Signed)
I suspect this is a form of eczema In health care---washes frequently and gloved often Will give depomedrol 40mg  due to severe pruritis now Diprolene cream for prn use Dermatologist if persists

## 2018-02-17 NOTE — Progress Notes (Signed)
Subjective:    Patient ID: Margaretha Seeds, female    DOB: 1981/02/08, 37 y.o.   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 butalbital-acetaminophen-caffeine (FIORICET, ESGIC) 50-325-40 MG tablet Take 1 tablet by mouth 2 (two) times daily as needed for headache.    . cetirizine (ZYRTEC) 10 MG tablet Take 10 mg by mouth daily.    . ClomiPHENE Citrate (CLOMID PO) Take 200 mg by mouth. Days 5 through 9    . levocetirizine (XYZAL) 5 MG tablet TAKE ONE TABLET BY MOUTH EACH EVENING 30 tablet 11  . montelukast (SINGULAIR) 10 MG tablet Take 1 tablet (10 mg total) by mouth at bedtime. 30 tablet 11  . multivitamin-iron-minerals-folic acid (CENTRUM) chewable tablet Chew 1 tablet by mouth daily.    Marland Kitchen pyridOXINE (VITAMIN B-6) 50 MG tablet Take 1 tablet (50 mg total) by mouth every 8 (eight) hours as needed. 20 tablet 0   No current facility-administered medications on file prior to visit.     Allergies  Allergen Reactions  . Omeprazole Shortness Of Breath  . Penicillins Shortness Of Breath  . Shellfish Allergy Shortness Of Breath  . Iodine Itching and Swelling  . Soap Itching    ANYTHING WITH FRAGRANCE    Past Medical History:  Diagnosis Date  . 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 Migraines   . 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 Seasonal allergies   . Wears contact lenses     Past Surgical History:  Procedure Laterality Date  .  CHOLECYSTECTOMY  11/2003  . IMAGE GUIDED SINUS SURGERY N/A 09/07/2016   Procedure: IMAGE GUIDED SINUS SURGERY;  Surgeon: Linus Salmons, MD;  Location: [facility];  Service: ENT;  Laterality: N/A;  STYKER GAVE DISK TO CECE 12-20 kp  . KNEE ARTHROSCOPY Left 2000  . KNEE ARTHROSCOPY WITH MEDIAL MENISECTOMY Right 10/10/2014   Procedure: RIGHT KNEE ARTHROSCOPY; RESECTION PLICA; CHONDROPLASTY, AND PARTIAL MEDIAL MENISCECTOMY;  Surgeon: Loreta Ave, MD;  Location: [facility];  Service: Orthopedics;  Laterality: Right;  . MAXILLARY ANTROSTOMY Bilateral 09/07/2016   Procedure: MAXILLARY ANTROSTOMY WITH TISSUE REMOVAL;  Surgeon: Linus Salmons, MD;  Location: [facility];  Service: ENT;  Laterality: Bilateral;  . NASAL TURBINATE REDUCTION Bilateral 09/07/2016   Procedure: TURBINATE REDUCTION/SUBMUCOSAL RESECTION;  Surgeon: Linus Salmons, MD;  Location: [facility];  Service: ENT;  Laterality: Bilateral;  . SEPTOPLASTY N/A 09/07/2016   Procedure: SEPTOPLASTY;  Surgeon: Linus Salmons, MD;  Location: [facility];  Service: ENT;  Laterality: N/A;  . TONSILLECTOMY AND ADENOIDECTOMY    . WISDOM TOOTH EXTRACTION      Family History  Problem Relation Age of Onset  . Arthritis Mother   . Ovarian cancer Mother   . Hyperlipidemia Mother   . Hypertension Mother   . Irritable bowel syndrome Mother   . Arthritis Father   . Hypertension Father   . Asthma Father   . Asthma Sister   . Arthritis Paternal  Grandmother     Social History   Socioeconomic History  . Marital status: Married    Spouse name: Not on file  . Number of children: 0  . Years of education: Not on file  . Highest education level: Not on file  Occupational History  . Occupation: CNA/ CPT  Social Needs  . Financial resource strain: Not on file  . Food insecurity:    Worry: Not on file    Inability: Not on file  . Transportation needs:    Medical: Not on file    Non-medical:  Not on file  Tobacco Use  . Smoking status: Never Smoker  . Smokeless tobacco: Never Used  Substance and Sexual Activity  . Alcohol use: Yes    Alcohol/week: 0.0 oz    Comment: occasionally  . Drug use: No  . Sexual activity: Yes  Lifestyle  . Physical activity:    Days per week: Not on file    Minutes per session: Not on file  . Stress: Not on file  Relationships  . Social connections:    Talks on phone: Not on file    Gets together: Not on file    Attends religious service: Not on file    Active member of club or organization: Not on file    Attends meetings of clubs or organizations: Not on file    Relationship status: Not on file  . Intimate partner violence:    Fear of current or ex partner: Not on file    Emotionally abused: Not on file    Physically abused: Not on file    Forced sexual activity: Not on file  Other Topics Concern  . Not on file  Social History Narrative  . Not on file   Review of Systems No fever Not sick    Objective:   Physical Exam  Constitutional: She appears well-developed. No distress.  Skin:  Several blisters on left palm only Don't look infectious           Assessment & Plan:

## 2018-03-05 ENCOUNTER — Encounter (HOSPITAL_COMMUNITY): Payer: Self-pay

## 2018-03-05 ENCOUNTER — Emergency Department (HOSPITAL_COMMUNITY)
Admission: EM | Admit: 2018-03-05 | Discharge: 2018-03-06 | Disposition: A | Discharge status: Home / Self Care | Payer: PRIVATE HEALTH INSURANCE | Attending: Emergency Medicine | Admitting: Emergency Medicine

## 2018-03-05 DIAGNOSIS — Z79899 Other long term (current) drug therapy: Secondary | ICD-10-CM | POA: Diagnosis not present

## 2018-03-05 DIAGNOSIS — R1011 Right upper quadrant pain: Secondary | ICD-10-CM | POA: Insufficient documentation

## 2018-03-05 DIAGNOSIS — R1030 Lower abdominal pain, unspecified: Secondary | ICD-10-CM | POA: Insufficient documentation

## 2018-03-05 DIAGNOSIS — R109 Unspecified abdominal pain: Secondary | ICD-10-CM

## 2018-03-05 LAB — CBC
HCT: 37.1 % (ref 36.0–46.0)
Hemoglobin: 11.5 g/dL — ABNORMAL LOW (ref 12.0–15.0)
MCH: 26.7 pg (ref 26.0–34.0)
MCHC: 31 g/dL (ref 30.0–36.0)
MCV: 86.1 fL (ref 78.0–100.0)
PLATELETS: 325 10*3/uL (ref 150–400)
RBC: 4.31 MIL/uL (ref 3.87–5.11)
RDW: 13.8 % (ref 11.5–15.5)
WBC: 10.6 10*3/uL — ABNORMAL HIGH (ref 4.0–10.5)

## 2018-03-05 LAB — URINALYSIS, ROUTINE W REFLEX MICROSCOPIC
BILIRUBIN URINE: NEGATIVE
Glucose, UA: NEGATIVE mg/dL
Hgb urine dipstick: NEGATIVE
KETONES UR: NEGATIVE mg/dL
Leukocytes, UA: NEGATIVE
NITRITE: NEGATIVE
PROTEIN: NEGATIVE mg/dL
Specific Gravity, Urine: 1.013 (ref 1.005–1.030)
pH: 6 (ref 5.0–8.0)

## 2018-03-05 LAB — I-STAT BETA HCG BLOOD, ED (MC, WL, AP ONLY)

## 2018-03-05 MED ORDER — SODIUM CHLORIDE 0.9 % IV BOLUS
1000.0000 mL | Freq: Once | INTRAVENOUS | Status: AC
  Administered 2018-03-06: 1000 mL via INTRAVENOUS

## 2018-03-05 MED ORDER — MORPHINE SULFATE (PF) 4 MG/ML IV SOLN
4.0000 mg | Freq: Once | INTRAVENOUS | Status: AC
  Administered 2018-03-06: 4 mg via INTRAVENOUS
  Filled 2018-03-05: qty 1

## 2018-03-05 NOTE — ED Triage Notes (Signed)
Pt reports that for the past two days she has been having lower abd pain and period cramping, reports heavy vaginal bleeding, denies n/v/d or dysuria

## 2018-03-05 NOTE — ED Notes (Signed)
Delay in lab draw pt in bathroom 

## 2018-03-06 ENCOUNTER — Emergency Department (HOSPITAL_COMMUNITY): Payer: PRIVATE HEALTH INSURANCE

## 2018-03-06 LAB — COMPREHENSIVE METABOLIC PANEL
ALBUMIN: 3.8 g/dL (ref 3.5–5.0)
ALT: 16 U/L (ref 14–54)
ANION GAP: 8 (ref 5–15)
AST: 21 U/L (ref 15–41)
Alkaline Phosphatase: 50 U/L (ref 38–126)
BILIRUBIN TOTAL: 0.3 mg/dL (ref 0.3–1.2)
BUN: 8 mg/dL (ref 6–20)
CHLORIDE: 106 mmol/L (ref 101–111)
CO2: 26 mmol/L (ref 22–32)
Calcium: 9.2 mg/dL (ref 8.9–10.3)
Creatinine, Ser: 0.76 mg/dL (ref 0.44–1.00)
GFR calc Af Amer: >60 mL/min (ref >60)
GFR calc non Af Amer: >60 mL/min (ref >60)
GLUCOSE: 104 mg/dL — AB (ref 65–99)
POTASSIUM: 3.9 mmol/L (ref 3.5–5.1)
Sodium: 140 mmol/L (ref 135–145)
TOTAL PROTEIN: 6.9 g/dL (ref 6.5–8.1)

## 2018-03-06 LAB — LIPASE, BLOOD: LIPASE: 42 U/L (ref 11–51)

## 2018-03-06 MED ORDER — IBUPROFEN 600 MG PO TABS: 600.0000 mg | ORAL_TABLET | Freq: Three times a day (TID) | ORAL | 0 refills | Status: DC | PRN

## 2018-03-06 MED ORDER — CYCLOBENZAPRINE HCL 10 MG PO TABS: 10.0000 mg | ORAL_TABLET | Freq: Three times a day (TID) | ORAL | 0 refills | Status: DC | PRN

## 2018-03-06 MED ORDER — HYDROCODONE-ACETAMINOPHEN 5-325 MG PO TABS: 1.0000 | ORAL_TABLET | ORAL | 0 refills | Status: DC | PRN

## 2018-03-06 NOTE — ED Provider Notes (Signed)
[facility] EMERGENCY DEPARTMENT Provider Note   CSN: [id number] Arrival date & time: 03/05/18  2254     History   Chief Complaint Chief Complaint  Patient presents with  . Abdominal Pain  . Vaginal Bleeding    HPI Mikayla Briggs is a 37 y.o. female.  HPI Patient is a 37 year old female presents the emergency department 2 days of worsening right flank and right-sided abdominal pain as well as lower abdominal discomfort.  Denies fevers and chills.  No nausea vomiting or diarrhea.  Denies dysuria or urinary frequency.  She is currently on her menstrual cycle.  Her last normal menstrual cycle prior to this was May 25.  She does report that her bleeding seems slightly heavier at this time.  She is trying to conceive with her husband at this time.  She has not checked a pregnancy test at home.  She reports moderate to severe right flank pain at this time as well.  No history of kidney stones.   Past Medical History:  Diagnosis Date  . 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 Migraines   . 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 Seasonal allergies   . Wears contact lenses     Patient Active Problem List   Diagnosis Date Noted  . Rash of hands 02/17/2018  . Environmental allergies 01/07/2016    Past Surgical History:  Procedure Laterality Date  . CHOLECYSTECTOMY  11/2003  . IMAGE GUIDED SINUS SURGERY N/A 09/07/2016   Procedure: IMAGE GUIDED SINUS SURGERY;  Surgeon: Linus Salmons, MD;  Location: [facility];  Service: ENT;  Laterality: N/A;  STYKER GAVE DISK TO CECE 12-20 kp  . KNEE ARTHROSCOPY Left 2000  . KNEE ARTHROSCOPY WITH MEDIAL MENISECTOMY Right 10/10/2014   Procedure: RIGHT KNEE ARTHROSCOPY; RESECTION PLICA; CHONDROPLASTY, AND PARTIAL MEDIAL MENISCECTOMY;  Surgeon: Loreta Ave, MD;  Location: [facility];  Service:  Orthopedics;  Laterality: Right;  . MAXILLARY ANTROSTOMY Bilateral 09/07/2016   Procedure: MAXILLARY ANTROSTOMY WITH TISSUE REMOVAL;  Surgeon: Linus Salmons, MD;  Location: [facility];  Service: ENT;  Laterality: Bilateral;  . NASAL TURBINATE REDUCTION Bilateral 09/07/2016   Procedure: TURBINATE REDUCTION/SUBMUCOSAL RESECTION;  Surgeon: Linus Salmons, MD;  Location: [facility];  Service: ENT;  Laterality: Bilateral;  . SEPTOPLASTY N/A 09/07/2016   Procedure: SEPTOPLASTY;  Surgeon: Linus Salmons, MD;  Location: [facility];  Service: ENT;  Laterality: N/A;  . TONSILLECTOMY AND ADENOIDECTOMY    . WISDOM TOOTH EXTRACTION       OB History   None      Home Medications    Prior to Admission medications   Medication Sig Start Date End Date Taking? Authorizing Provider  betamethasone dipropionate (DIPROLENE) 0.05 % cream Apply topically 2 (two) times daily as needed. 02/17/18   Karie Schwalbe, MD  butalbital-acetaminophen-caffeine (FIORICET, ESGIC) [phone] MG tablet Take 1 tablet by mouth 2 (two) times daily as needed for headache.    [provider]  cetirizine (ZYRTEC) 10 MG tablet Take 10 mg by mouth daily.    [provider]  cyclobenzaprine (FLEXERIL) 10 MG tablet Take 1 tablet (10 mg total) by mouth 3 (three) times daily as needed for muscle spasms. 03/06/18   Azalia Bilis, MD  HYDROcodone-acetaminophen (NORCO/VICODIN) 5-325 MG tablet Take 1 tablet by mouth every 4 (four) hours as needed for moderate pain. 03/06/18  Azalia Bilisampos, Tyreck Bell, MD  ibuprofen (ADVIL,MOTRIN) 600 MG tablet Take 1 tablet (600 mg total) by mouth every 8 (eight) hours as needed. 03/06/18   Azalia Bilisampos, Shakiera Edelson, MD  levocetirizine (XYZAL) 5 MG tablet TAKE ONE TABLET BY MOUTH EACH EVENING 07/05/17   Lorre MunroeBaity, Regina W, NP  montelukast (SINGULAIR) 10 MG tablet Take 1 tablet (10 mg total) by mouth at bedtime. 07/05/17   Lorre MunroeBaity, Regina W, NP  multivitamin-iron-minerals-folic acid (CENTRUM)  chewable tablet Chew 1 tablet by mouth daily.    [provider]  pyridOXINE (VITAMIN B-6) 50 MG tablet Take 1 tablet (50 mg total) by mouth every 8 (eight) hours as needed. 07/03/17   Rockne MenghiniNorman, Anne-Caroline, MD    Family History Family History  Problem Relation Age of Onset  . Arthritis Mother   . Ovarian cancer Mother   . Hyperlipidemia Mother   . Hypertension Mother   . Irritable bowel syndrome Mother   . Arthritis Father   . Hypertension Father   . Asthma Father   . Asthma Sister   . Arthritis Paternal Grandmother     Social History Social History   Tobacco Use  . Smoking status: Never Smoker  . Smokeless tobacco: Never Used  Substance Use Topics  . Alcohol use: Yes    Alcohol/week: 0.0 oz    Comment: occasionally  . Drug use: No     Allergies   Omeprazole; Penicillins; Shellfish allergy; Iodine; and Soap   Review of Systems Review of Systems  All other systems reviewed and are negative.    Physical Exam Updated Vital Signs BP 119/81 (BP Location: Right Arm)   Pulse (P) 66   Temp 98.3 F (36.8 C) (Oral)   Resp 16   Ht 5\' 6"  (1.676 m)   Wt 113.4 kg (250 lb)   LMP 03/03/2018   SpO2 98%   BMI 40.35 kg/m   Physical Exam  Constitutional: She is oriented to person, place, and time. She appears well-developed and well-nourished.  HENT:  Head: Normocephalic.  Eyes: EOM are normal.  Neck: Normal range of motion.  Cardiovascular: Normal rate and regular rhythm.  Pulmonary/Chest: Effort normal and breath sounds normal.  Abdominal: Soft. She exhibits no distension.  Mild suprapubic tenderness.  Genitourinary:  Genitourinary Comments: Right CVA tenderness  Musculoskeletal: Normal range of motion.  Neurological: She is alert and oriented to person, place, and time.  Psychiatric: She has a normal mood and affect.  Nursing note and vitals reviewed.    ED Treatments / Results  Labs (all labs ordered are listed, but only abnormal results are  displayed) Labs Reviewed  COMPREHENSIVE METABOLIC PANEL - Abnormal; Notable for the following components:      Result Value   Glucose, Bld 104 (*)    All other components within normal limits  CBC - Abnormal; Notable for the following components:   WBC 10.6 (*)    Hemoglobin 11.5 (*)    All other components within normal limits  LIPASE, BLOOD  URINALYSIS, ROUTINE W REFLEX MICROSCOPIC  I-STAT BETA HCG BLOOD, ED ([facility], [facility], [facility] ONLY)    EKG None  Radiology Ct Abdomen Pelvis Wo Contrast  Result Date: 03/06/2018 CLINICAL DATA:  37 year old female with abdominal pain. Heavy vaginal bleeding. EXAM: CT ABDOMEN AND PELVIS WITHOUT CONTRAST TECHNIQUE: Multidetector CT imaging of the abdomen and pelvis was performed following the standard protocol without IV contrast. COMPARISON:  Abdominal ultrasound dated 03/31/2015 FINDINGS: Evaluation of this exam is limited in the absence of intravenous contrast. Lower chest:  The visualized lung bases are clear. No intra-abdominal free air or free fluid. Hepatobiliary: The liver is unremarkable.  Cholecystectomy. Pancreas: Unremarkable. No pancreatic ductal dilatation or surrounding inflammatory changes. Spleen: Normal in size without focal abnormality. Adrenals/Urinary Tract: Adrenal glands are unremarkable. Kidneys are normal, without renal calculi, focal lesion, or hydronephrosis. Bladder is unremarkable. Stomach/Bowel: Stomach is within normal limits. Appendix appears normal. No evidence of bowel wall thickening, distention, or inflammatory changes. Vascular/Lymphatic: No significant vascular findings are present. No enlarged abdominal or pelvic lymph nodes. Reproductive: The uterus and ovaries are grossly unremarkable. A tampon is noted in the vagina. Other: No abdominal wall hernia or abnormality. No abdominopelvic ascites. Musculoskeletal: Degenerative changes at L5-S1. No acute osseous pathology. IMPRESSION: No acute intra-abdominal or pelvic pathology.  Electronically Signed   By: Elgie Collard M.D.   On: 03/06/2018 01:35    Procedures Procedures (including critical care time)  Medications Ordered in ED Medications  morphine 4 MG/ML injection 4 mg (4 mg Intravenous Given 03/06/18 0036)  sodium chloride 0.9 % bolus 1,000 mL (1,000 mLs Intravenous New Bag/Given 03/06/18 0036)     Initial Impression / Assessment and Plan / ED Course  I have reviewed the triage vital signs and the nursing notes.  Pertinent labs & imaging results that were available during my care of the patient were reviewed by me and considered in my medical decision making (see chart for details).     Symptomatic management while in the emergency department.  Labs urine and urine pregnancy without abnormality.  Patient given pain control here in the emergency department and is feeling better at this time.  Given ongoing discomfort and pain during the initial portion of the evaluation she underwent CT imaging of her abdomen and pelvis which demonstrates no acute intra-abdominal pathology to explain the patient's symptoms.  There are no signs of zoster at this time.  Patient understands return to the emergency department for new or worsening symptoms.  Close primary care follow-up.  Final Clinical Impressions(s) / ED Diagnoses   Final diagnoses:  Acute abdominal pain  Acute right flank pain    ED Discharge Orders        Ordered    ibuprofen (ADVIL,MOTRIN) 600 MG tablet  Every 8 hours PRN     03/06/18 0205    cyclobenzaprine (FLEXERIL) 10 MG tablet  3 times daily PRN     03/06/18 0205    HYDROcodone-acetaminophen (NORCO/VICODIN) 5-325 MG tablet  Every 4 hours PRN     03/06/18 0205       Azalia Bilis, MD 03/06/18 0210
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
## 2018-04-25 ENCOUNTER — Ambulatory Visit: Payer: PRIVATE HEALTH INSURANCE | Admitting: Family Medicine

## 2018-04-25 ENCOUNTER — Encounter: Payer: Self-pay | Admitting: Family Medicine

## 2018-04-25 VITALS — BP 120/82 | HR 101 | Temp 98.2°F | Wt 250.0 lb

## 2018-04-25 DIAGNOSIS — G43909 Migraine, unspecified, not intractable, without status migrainosus: Secondary | ICD-10-CM

## 2018-04-25 MED ORDER — ONDANSETRON 8 MG PO TBDP: 8.0000 mg | ORAL_TABLET | Freq: Three times a day (TID) | ORAL | 0 refills | Status: DC | PRN

## 2018-04-25 MED ORDER — PREDNISONE 20 MG PO TABS: ORAL_TABLET | ORAL | 0 refills | Status: DC

## 2018-04-25 MED ORDER — KETOROLAC TROMETHAMINE 60 MG/2ML IM SOLN
60.0000 mg | Freq: Once | INTRAMUSCULAR | Status: AC
  Administered 2018-04-25: 60 mg via INTRAMUSCULAR

## 2018-04-25 NOTE — Patient Instructions (Signed)
Migraine Headache A migraine headache is an intense, throbbing pain on one side or both sides of the head. Migraines may also cause other symptoms, such as nausea, vomiting, and sensitivity to light and noise. What are the causes? Doing or taking certain things may also trigger migraines, such as:  Alcohol.  Smoking.  Medicines, such as: ? Medicine used to treat chest pain (nitroglycerine). ? Birth control pills. ? Estrogen pills. ? Certain blood pressure medicines.  Aged cheeses, chocolate, or caffeine.  Foods or drinks that contain nitrates, glutamate, aspartame, or tyramine.  Physical activity.  Other things that may trigger a migraine include:  Menstruation.  Pregnancy.  Hunger.  Stress, lack of sleep, too much sleep, or fatigue.  Weather changes.  What increases the risk? The following factors may make you more likely to experience migraine headaches:  Age. Risk increases with age.  Family history of migraine headaches.  Being Caucasian.  Depression and anxiety.  Obesity.  Being a woman.  Having a hole in the heart (patent foramen ovale) or other heart problems.  What are the signs or symptoms? The main symptom of this condition is pulsating or throbbing pain. Pain may:  Happen in any area of the head, such as on one side or both sides.  Interfere with daily activities.  Get worse with physical activity.  Get worse with exposure to bright lights or loud noises.  Other symptoms may include:  Nausea.  Vomiting.  Dizziness.  General sensitivity to bright lights, loud noises, or smells.  Before you get a migraine, you may get warning signs that a migraine is developing (aura). An aura may include:  Seeing flashing lights or having blind spots.  Seeing bright spots, halos, or zigzag lines.  Having tunnel vision or blurred vision.  Having numbness or a tingling feeling.  Having trouble talking.  Having muscle weakness.  How is this  diagnosed? A migraine headache can be diagnosed based on:  Your symptoms.  A physical exam.  Tests, such as CT scan or MRI of the head. These imaging tests can help rule out other causes of headaches.  Taking fluid from the spine (lumbar puncture) and analyzing it (cerebrospinal fluid analysis, or CSF analysis).  How is this treated? A migraine headache is usually treated with medicines that:  Relieve pain.  Relieve nausea.  Prevent migraines from coming back.  Treatment may also include:  Acupuncture.  Lifestyle changes like avoiding foods that trigger migraines.  Follow these instructions at home: Medicines  Take over-the-counter and prescription medicines only as told by your health care provider.  Do not drive or use heavy machinery while taking prescription pain medicine.  To prevent or treat constipation while you are taking prescription pain medicine, your health care provider may recommend that you: ? Drink enough fluid to keep your urine clear or pale yellow. ? Take over-the-counter or prescription medicines. ? Eat foods that are high in fiber, such as fresh fruits and vegetables, whole grains, and beans. ? Limit foods that are high in fat and processed sugars, such as fried and sweet foods. Lifestyle  Avoid alcohol use.  Do not use any products that contain nicotine or tobacco, such as cigarettes and e-cigarettes. If you need help quitting, ask your health care provider.  Get at least 8 hours of sleep every night.  Limit your stress. General instructions   Keep a journal to find out what may trigger your migraine headaches. For example, write down: ? What you eat and   drink. ? How much sleep you get. ? Any change to your diet or medicines.  If you have a migraine: ? Avoid things that make your symptoms worse, such as bright lights. ? It may help to lie down in a dark, quiet room. ? Do not drive or use heavy machinery. ? Ask your health care provider  what activities are safe for you while you are experiencing symptoms.  Keep all follow-up visits as told by your health care provider. This is important. Contact a health care provider if:  You develop symptoms that are different or more severe than your usual migraine symptoms. Get help right away if:  Your migraine becomes severe.  You have a fever.  You have a stiff neck.  You have vision loss.  Your muscles feel weak or like you cannot control them.  You start to lose your balance often.  You develop trouble walking.  You faint. This information is not intended to replace advice given to you by your health care provider. Make sure you discuss any questions you have with your health care provider. Document Released: 08/30/2005 Document Revised: 03/19/2016 Document Reviewed: 02/16/2016 Elsevier Interactive Patient Education  2017 [employer]   

## 2018-04-25 NOTE — Progress Notes (Signed)
Subjective:     Patient ID: Mikayla Briggs, female   DOB: 13-May-1981, 37 y.o.   MRN: [id number]  HPI Patient seen as a work in with acute headache. Onset around 3:30 AM today. She has history of migraine headache without aura. This headache is fairly typical of previous headaches. Location is left occipital with radiation toward parietal region. She's had some nausea without vomiting. Throbbing quality. No photosensitivity. No focal neurologic symptoms.  She took 2 Fioricet this morning without relief. This was prescribed by her GYN. She has not taking any other medications today. She had taken Imitrex in the past but is trying to avoid triptan's as she and her husband are trying to get pregnant currently. She has done well in the past with Toradol injections  Migraine trigger unclear. She thinks increased stress may be related. She recently took over care/custody of a niece.  Past Medical History:  Diagnosis Date  . 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 Migraines   . 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 Seasonal allergies   . Wears contact lenses    Past Surgical History:  Procedure Laterality Date  . CHOLECYSTECTOMY  11/2003  . IMAGE GUIDED SINUS SURGERY N/A 09/07/2016   Procedure: IMAGE GUIDED SINUS SURGERY;  Surgeon: Linus Salmonshapman McQueen, MD;  [facility];  Service: ENT;  Laterality: N/A;  STYKER GAVE DISK TO CECE 12-20 kp  . KNEE ARTHROSCOPY Left 2000  . KNEE ARTHROSCOPY WITH MEDIAL MENISECTOMY Right 10/10/2014   Procedure: RIGHT KNEE ARTHROSCOPY; RESECTION PLICA; CHONDROPLASTY, AND PARTIAL MEDIAL MENISCECTOMY;  Surgeon: Loreta Aveaniel F Murphy, MD;  Location: [facility];  Service: Orthopedics;  Laterality: Right;  . MAXILLARY ANTROSTOMY Bilateral 09/07/2016   Procedure: MAXILLARY ANTROSTOMY WITH TISSUE REMOVAL;  Surgeon: Linus Salmonshapman McQueen, MD;  [facility];  Service: ENT;  Laterality: Bilateral;  . NASAL TURBINATE REDUCTION Bilateral 09/07/2016   Procedure: TURBINATE REDUCTION/SUBMUCOSAL RESECTION;  Surgeon: Linus Salmonshapman McQueen, MD;  [facility];  Service: ENT;  Laterality: Bilateral;  . SEPTOPLASTY N/A 09/07/2016   Procedure: SEPTOPLASTY;  Surgeon: Linus Salmonshapman McQueen, MD;  [facility];  Service: ENT;  Laterality: N/A;  . TONSILLECTOMY AND ADENOIDECTOMY    . WISDOM TOOTH EXTRACTION      reports that she has never smoked. She has never used smokeless tobacco. She reports that she drinks alcohol. She reports that she does not use drugs. family history includes Arthritis in her father, mother, and paternal grandmother; Asthma in her father and sister; Hyperlipidemia in her mother; Hypertension in her father and mother; Irritable bowel syndrome in her mother; Ovarian cancer in her mother. Allergies  Allergen Reactions  . 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   . Omeprazole Shortness Of Breath  . Penicillins Shortness Of Breath  . Shellfish Allergy Shortness Of Breath  . Iodine Itching and Swelling  . Soap Itching    ANYTHING WITH FRAGRANCE     Review of Systems  Constitutional: Negative for chills and fever.  Respiratory: Negative for shortness of breath.   Cardiovascular: Negative for chest pain.  Gastrointestinal: Positive for nausea. Negative for vomiting.  Neurological: Positive for headaches. Negative for dizziness and seizures.       Objective:   Physical Exam  Constitutional: She is oriented to person, place, and time. She appears well-developed and well-nourished.  HENT:  Head: Normocephalic and atraumatic.  Eyes: Pupils are equal, round, and reactive to light. EOM are normal.  Neck: Neck supple.  Cardiovascular: Normal rate and regular rhythm.  Pulmonary/Chest:  Effort normal and breath sounds normal.  Neurological: She is alert and oriented to person, place, and time. No cranial nerve deficit. Coordination normal.       Assessment:     Migraine headache without aura.    Plan:     -Toradol 60 mg IM -Prescription for Zofran 8 mg ODT 1 every 8 hours as needed for nausea and vomiting -Prescription for prednisone 20 mg 2 tablets once daily for 3 days if headache not fully relieved with the Toradol -discussed potential triggers for migraine. -Follow-up with primary for any persistent or recurrent headaches  Kristian CoveyBruce W Kerra Guilfoil MD [facility]

## 2018-04-26 NOTE — Patient Instructions (Addendum)
Your procedure is scheduled on:  Friday, 8/23  [facility] at: 6 am  Pick up the phone at the desk and dial [phone].  Call this number if you have problems the morning of surgery: [phone].  Remember: Do NOT eat food or Do NOT drink clear liquids (including water) after midnight Thursday  Take these medicines the morning of surgery with a SIP OF WATER:  zyrtec  Stop herbal medications, vitamin supplements, Ibuprofen/NSAIDS at this time.  Do NOT wear jewelry (body piercing), metal hair clips/bobby pins, make-up, or nail polish. Do NOT wear lotions, powders, or perfumes.  You may wear deoderant. Do NOT shave for 48 hours prior to surgery. Do NOT bring valuables to the hospital. Contacts may not be worn into surgery.  Have a responsible adult drive you home and stay with you for 24 hours after your procedure.  Home with Husband Mikayla Briggs cell [phone]

## 2018-05-01 ENCOUNTER — Encounter (HOSPITAL_COMMUNITY): Payer: Self-pay

## 2018-05-01 ENCOUNTER — Encounter (HOSPITAL_COMMUNITY)
Admission: RE | Admit: 2018-05-01 | Discharge: 2018-05-01 | Disposition: A | Discharge status: Home / Self Care | Payer: PRIVATE HEALTH INSURANCE | Source: Ambulatory Visit | Attending: Obstetrics and Gynecology | Admitting: Obstetrics and Gynecology

## 2018-05-01 ENCOUNTER — Other Ambulatory Visit: Payer: Self-pay

## 2018-05-01 DIAGNOSIS — Z01812 Encounter for preprocedural laboratory examination: Secondary | ICD-10-CM | POA: Insufficient documentation

## 2018-05-01 LAB — CBC
HEMATOCRIT: 35.4 % — AB (ref 36.0–46.0)
HEMOGLOBIN: 11.6 g/dL — AB (ref 12.0–15.0)
MCH: 27.6 pg (ref 26.0–34.0)
MCHC: 32.8 g/dL (ref 30.0–36.0)
MCV: 84.1 fL (ref 78.0–100.0)
Platelets: 282 10*3/uL (ref 150–400)
RBC: 4.21 MIL/uL (ref 3.87–5.11)
RDW: 14.8 % (ref 11.5–15.5)
WBC: 11.6 10*3/uL — ABNORMAL HIGH (ref 4.0–10.5)

## 2018-05-01 LAB — TYPE AND SCREEN
ABO/RH(D): A NEG
Antibody Screen: NEGATIVE

## 2018-05-01 LAB — ABO/RH: ABO/RH(D): A NEG

## 2018-05-01 NOTE — H&P (Signed)
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 She reports mild pelvic cramps. Has been evaluated by Dr . Elesa Hackereaton - infertility and he recommends laparoscopy to investigate for endometriosis.  Past Medical History:  Diagnosis Date  . 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 Migraines   . 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 Seasonal allergies   . Wears contact lenses    Past Surgical History:  Procedure Laterality Date  . CHOLECYSTECTOMY  11/2003  . IMAGE GUIDED SINUS SURGERY N/A 09/07/2016   Procedure: IMAGE GUIDED SINUS SURGERY;  Surgeon: Linus Salmonshapman McQueen, MD;  [facility];  Service: ENT;  Laterality: N/A;  STYKER GAVE DISK TO CECE 12-20 kp  . KNEE ARTHROSCOPY Left 2000  . KNEE ARTHROSCOPY WITH MEDIAL MENISECTOMY Right 10/10/2014   Procedure: RIGHT KNEE ARTHROSCOPY; RESECTION PLICA; CHONDROPLASTY, AND PARTIAL MEDIAL MENISCECTOMY;  Surgeon: Loreta Aveaniel F Murphy, MD;  Location: [facility];  Service: Orthopedics;  Laterality: Right;  . MAXILLARY ANTROSTOMY Bilateral 09/07/2016   Procedure: MAXILLARY ANTROSTOMY WITH TISSUE REMOVAL;  Surgeon: Linus Salmonshapman McQueen, MD;  [facility];  Service: ENT;  Laterality: Bilateral;  . NASAL TURBINATE REDUCTION Bilateral 09/07/2016   Procedure: TURBINATE REDUCTION/SUBMUCOSAL RESECTION;  Surgeon: Linus Salmonshapman McQueen, MD;  [facility];  Service: ENT;  Laterality: Bilateral;  . SEPTOPLASTY N/A 09/07/2016   Procedure: SEPTOPLASTY;  Surgeon: Linus Salmonshapman McQueen, MD;  [facility];  Service: ENT;  Laterality: N/A;  . TONSILLECTOMY AND ADENOIDECTOMY    . WISDOM TOOTH EXTRACTION     Prior to Admission medications   Medication Sig Start Date End Date Taking? Authorizing Provider  betamethasone dipropionate (DIPROLENE) 0.05 % cream Apply topically 2 (two) times daily as needed. Patient taking  differently: Apply 1 application topically 2 (two) times daily as needed (eczema).  02/17/18  Yes Karie SchwalbeLetvak, Richard I, MD  butalbital-acetaminophen-caffeine (FIORICET, ESGIC) [phone] MG tablet Take 1 tablet by mouth 2 (two) times daily as needed for headache.   Yes [provider]  cetirizine (ZYRTEC) 10 MG tablet Take 10 mg by mouth daily.   Yes [provider]  levocetirizine (XYZAL) 5 MG tablet TAKE ONE TABLET BY MOUTH EACH EVENING 07/05/17  Yes Baity, Salvadore Oxfordegina W, NP  montelukast (SINGULAIR) 10 MG tablet Take 1 tablet (10 mg total) by mouth at bedtime. 07/05/17  Yes Lorre MunroeBaity, Regina W, NP  Multiple Vitamin (MULTIVITAMIN WITH MINERALS) TABS tablet Take 1 tablet by mouth at bedtime.   Yes [provider]  ondansetron (ZOFRAN ODT) 8 MG disintegrating tablet Take 1 tablet (8 mg total) by mouth every 8 (eight) hours as needed for nausea or vomiting. 04/25/18  Yes Burchette, Elberta FortisBruce W, MD  pyridOXINE (VITAMIN B-6) 50 MG tablet Take 1 tablet (50 mg total) by mouth every 8 (eight) hours as needed. Patient taking differently: Take 50 mg by mouth daily as needed (stomach pain).  07/03/17  Yes Rockne MenghiniNorman, Anne-Caroline, MD  cyclobenzaprine (FLEXERIL) 10 MG tablet Take 1 tablet (10 mg total) by mouth 3 (three) times daily as needed for muscle spasms. Patient not taking: Reported on 04/27/2018 03/06/18   Azalia Bilisampos, Kevin, MD  HYDROcodone-acetaminophen (NORCO/VICODIN) 5-325 MG tablet Take 1 tablet by mouth every 4 (four) hours as needed for moderate pain. Patient not taking: Reported on 04/27/2018 03/06/18   Azalia Bilisampos, Kevin, MD  ibuprofen (ADVIL,MOTRIN) 600 MG tablet Take 1 tablet (600 mg total) by  mouth every 8 (eight) hours as needed. Patient not taking: Reported on 04/27/2018 03/06/18   Azalia Bilisampos, Kevin, MD  predniSONE (DELTASONE) 20 MG tablet Take two tablets once daily for three days. 04/25/18   Burchette, Elberta FortisBruce W, MD   Allergies . Ivp dye [iodinated diagnostic agents]; Omeprazole; Penicillins; Shellfish  allergy; Iodine; and Soap  VSS General alert and oriented Lung CTAB Car RRR Abdomen soft and non tender Pelvic unremarkable \ IMPRESSION: Infertility   PLAN: Diagnostic Laparoscopy, Possible Fulguration of endometriosis Chromopertubation  Risks reviewed Consent signed

## 2018-05-05 ENCOUNTER — Ambulatory Visit (HOSPITAL_COMMUNITY)
Admission: AD | Admit: 2018-05-05 | Discharge: 2018-05-05 | Disposition: A | Discharge status: Home / Self Care | Payer: PRIVATE HEALTH INSURANCE | Source: Ambulatory Visit | Attending: Obstetrics and Gynecology | Admitting: Obstetrics and Gynecology

## 2018-05-05 ENCOUNTER — Encounter (HOSPITAL_COMMUNITY)
Admission: AD | Disposition: A | Discharge status: Home / Self Care | Payer: Self-pay | Source: Ambulatory Visit | Attending: Obstetrics and Gynecology

## 2018-05-05 ENCOUNTER — Encounter (HOSPITAL_COMMUNITY): Payer: Self-pay | Admitting: Anesthesiology

## 2018-05-05 ENCOUNTER — Encounter (HOSPITAL_COMMUNITY): Payer: Self-pay

## 2018-05-05 ENCOUNTER — Other Ambulatory Visit: Payer: Self-pay

## 2018-05-05 DIAGNOSIS — Z91041 Radiographic dye allergy status: Secondary | ICD-10-CM | POA: Insufficient documentation

## 2018-05-05 DIAGNOSIS — Z91013 Allergy to seafood: Secondary | ICD-10-CM | POA: Diagnosis not present

## 2018-05-05 DIAGNOSIS — Z538 Procedure and treatment not carried out for other reasons: Secondary | ICD-10-CM | POA: Diagnosis not present

## 2018-05-05 DIAGNOSIS — Z888 Allergy status to other drugs, medicaments and biological substances status: Secondary | ICD-10-CM | POA: Diagnosis not present

## 2018-05-05 DIAGNOSIS — Z880 Allergy status to penicillin: Secondary | ICD-10-CM | POA: Diagnosis not present

## 2018-05-05 DIAGNOSIS — N979 Female infertility, unspecified: Secondary | ICD-10-CM | POA: Diagnosis present

## 2018-05-05 SURGERY — LAPAROSCOPY, DIAGNOSTIC
Anesthesia: General

## 2018-05-05 MED ORDER — SODIUM CHLORIDE 0.9 % IV SOLN: 2.0000 g | INTRAVENOUS | Status: DC

## 2018-05-05 MED ORDER — LACTATED RINGERS IV SOLN: INTRAVENOUS | Status: DC

## 2018-05-05 MED ORDER — LACTATED RINGERS IV SOLN
INTRAVENOUS | Status: DC
  Administered 2018-05-05: 125 mL/h via INTRAVENOUS

## 2018-05-05 MED ORDER — SCOPOLAMINE 1 MG/3DAYS TD PT72
1.0000 | MEDICATED_PATCH | Freq: Once | TRANSDERMAL | Status: DC
  Administered 2018-05-05: 1.5 mg via TRANSDERMAL

## 2018-05-05 MED ORDER — SCOPOLAMINE 1 MG/3DAYS TD PT72
MEDICATED_PATCH | TRANSDERMAL | Status: AC
  Administered 2018-05-05: 1.5 mg via TRANSDERMAL
  Filled 2018-05-05: qty 1

## 2018-05-05 NOTE — Anesthesia Preprocedure Evaluation (Deleted)
Anesthesia Evaluation Anesthesia Physical Anesthesia Plan Anesthesia Quick Evaluation  

## 2018-05-05 NOTE — Progress Notes (Signed)
Spoke with:  Yvonne NPO:  After Midnight, no gum, candy, or mints   Arrival time:  0545AM Labs:  (CBC and T&S 05/01/18 in epic) AM medications: Zyrtec Pre op orders: NO Ride home:  Derryl HarborLonnie (husband) [phone]

## 2018-05-05 NOTE — H&P (Signed)
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 She reports mild pelvic cramps. Has been evaluated by Dr. Elesa Hacker - infertility and he recommends laparoscopy to investigate for endometriosis.  Past Medical History:  Diagnosis Date  . 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 Migraines   . 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 Seasonal allergies   . Wears contact lenses    Past Surgical History:  Procedure Laterality Date  . CHOLECYSTECTOMY  11/2003  . IMAGE GUIDED SINUS SURGERY N/A 09/07/2016   Procedure: IMAGE GUIDED SINUS SURGERY;  Surgeon: Linus Salmons, MD;  [facility];  Service: ENT;  Laterality: N/A;  STYKER GAVE DISK TO CECE 12-20 kp  . KNEE ARTHROSCOPY Left 2000  . KNEE ARTHROSCOPY WITH MEDIAL MENISECTOMY Right 10/10/2014   Procedure: RIGHT KNEE ARTHROSCOPY; RESECTION PLICA; CHONDROPLASTY, AND PARTIAL MEDIAL MENISCECTOMY;  Surgeon: Loreta Ave, MD;  Location: [facility];  Service: Orthopedics;  Laterality: Right;  . MAXILLARY ANTROSTOMY Bilateral 09/07/2016   Procedure: MAXILLARY ANTROSTOMY WITH TISSUE REMOVAL;  Surgeon: Linus Salmons, MD;  Location: [facility];  Service: ENT;  Laterality: Bilateral;  . NASAL TURBINATE REDUCTION Bilateral 09/07/2016   Procedure: TURBINATE REDUCTION/SUBMUCOSAL RESECTION;  Surgeon: Linus Salmons, MD;  [facility];  Service: ENT;  Laterality: Bilateral;  . SEPTOPLASTY N/A 09/07/2016   Procedure: SEPTOPLASTY;  Surgeon: Linus Salmons, MD;  [facility];  Service: ENT;  Laterality: N/A;  . TONSILLECTOMY AND ADENOIDECTOMY    . WISDOM TOOTH EXTRACTION     Prior to Admission medications   Medication Sig Start Date End Date Taking? Authorizing Provider  betamethasone dipropionate (DIPROLENE) 0.05 % cream Apply topically 2 (two) times daily as needed. Patient taking  differently: Apply 1 application topically 2 (two) times daily as needed (eczema).  02/17/18   Karie Schwalbe, MD  butalbital-acetaminophen-caffeine (FIORICET, ESGIC) [phone] MG tablet Take 1 tablet by mouth 2 (two) times daily as needed for headache.    [provider]  cetirizine (ZYRTEC) 10 MG tablet Take 10 mg by mouth daily.    [provider]  cyclobenzaprine (FLEXERIL) 10 MG tablet Take 1 tablet (10 mg total) by mouth 3 (three) times daily as needed for muscle spasms. Patient not taking: Reported on 04/27/2018 03/06/18   Azalia Bilis, MD  HYDROcodone-acetaminophen (NORCO/VICODIN) 5-325 MG tablet Take 1 tablet by mouth every 4 (four) hours as needed for moderate pain. Patient not taking: Reported on 04/27/2018 03/06/18   Azalia Bilis, MD  ibuprofen (ADVIL,MOTRIN) 600 MG tablet Take 1 tablet (600 mg total) by mouth every 8 (eight) hours as needed. Patient not taking: Reported on 04/27/2018 03/06/18   Azalia Bilis, MD  levocetirizine Elita Boone) 5 MG tablet TAKE ONE TABLET BY MOUTH EACH EVENING 07/05/17   Lorre Munroe, NP  montelukast (SINGULAIR) 10 MG tablet Take 1 tablet (10 mg total) by mouth at bedtime. 07/05/17   Lorre Munroe, NP  Multiple Vitamin (MULTIVITAMIN WITH MINERALS) TABS tablet Take 1 tablet by mouth at bedtime.    [provider]  ondansetron (ZOFRAN ODT) 8 MG disintegrating tablet Take 1 tablet (8 mg total) by mouth every 8 (eight) hours as needed for nausea or vomiting. 04/25/18   Burchette, Elberta Fortis, MD  predniSONE (DELTASONE) 20 MG tablet Take two tablets once daily for three days. 04/25/18   Burchette, Elberta Fortis, MD  pyridOXINE (VITAMIN  B-6) 50 MG tablet Take 1 tablet (50 mg total) by mouth every 8 (eight) hours as needed. Patient taking differently: Take 50 mg by mouth daily as needed (stomach pain).  07/03/17   Rockne MenghiniNorman, Anne-Caroline, MD   Allergies Iodine; Ivp dye [iodinated diagnostic agents]; Omeprazole; Penicillins; Shellfish allergy; and  Soap Family History  Problem Relation Age of Onset  . Arthritis Mother   . Ovarian cancer Mother   . Hyperlipidemia Mother   . Hypertension Mother   . Irritable bowel syndrome Mother   . Arthritis Father   . Hypertension Father   . Asthma Father   . Asthma Sister   . Arthritis Paternal Grandmother    Social History   Socioeconomic History  . Marital status: Married    Spouse name: Derryl HarborLonnie  . Number of children: 0  . Years of education: Not on file  . Highest education level: Not on file  Occupational History  . Occupation: CNA/ CPT  Social Needs  . Financial resource strain: Not on file  . Food insecurity:    Worry: Not on file    Inability: Not on file  . Transportation needs:    Medical: Not on file    Non-medical: Not on file  Tobacco Use  . Smoking status: Never Smoker  . Smokeless tobacco: Never Used  Substance and Sexual Activity  . Alcohol use: Yes    Alcohol/week: 0.0 standard drinks    Comment: occasionally  . Drug use: No  . Sexual activity: Yes    Birth control/protection: None  Lifestyle  . Physical activity:    Days per week: Not on file    Minutes per session: Not on file  . Stress: Not on file  Relationships  . Social connections:    Talks on phone: Not on file    Gets together: Not on file    Attends religious service: Not on file    Active member of club or organization: Not on file    Attends meetings of clubs or organizations: Not on file    Relationship status: Not on file  Other Topics Concern  . Not on file  Social History Narrative  . Not on file   LMP 04/24/2018 (Exact Date)  No results found for this or any previous visit (from the past 24 hour(s)). General alert and oriented Lung CTAB Car RRR Abd non tender Pelvic unremarkable  IMPRESSION: Infertility  PLAN Diagnostic Laparoscopy Chromopertubation Risks reviewed Consent signed

## 2018-05-05 NOTE — Progress Notes (Signed)
Patient surgery cancelled due to air condition issues in the OR, reason explained to patient by Dr. Vincente PoliGrewal and Vista MinkNick Cranston, OR manager. IV discontinued, patient discharged home with husband ambulatory.

## 2018-05-07 MED ORDER — GENTAMICIN SULFATE 40 MG/ML IJ SOLN
INTRAVENOUS | Status: AC
  Administered 2018-05-08: 400 mg via INTRAVENOUS
  Filled 2018-05-07 (×4): qty 10

## 2018-05-08 ENCOUNTER — Ambulatory Visit (HOSPITAL_BASED_OUTPATIENT_CLINIC_OR_DEPARTMENT_OTHER): Payer: PRIVATE HEALTH INSURANCE | Admitting: Anesthesiology

## 2018-05-08 ENCOUNTER — Encounter (HOSPITAL_BASED_OUTPATIENT_CLINIC_OR_DEPARTMENT_OTHER)
Admission: RE | Disposition: A | Discharge status: Home / Self Care | Payer: Self-pay | Source: Ambulatory Visit | Attending: Obstetrics and Gynecology

## 2018-05-08 ENCOUNTER — Encounter (HOSPITAL_BASED_OUTPATIENT_CLINIC_OR_DEPARTMENT_OTHER): Payer: Self-pay | Admitting: Emergency Medicine

## 2018-05-08 ENCOUNTER — Ambulatory Visit (HOSPITAL_BASED_OUTPATIENT_CLINIC_OR_DEPARTMENT_OTHER)
Admission: RE | Admit: 2018-05-08 | Discharge: 2018-05-08 | Disposition: A | Discharge status: Home / Self Care | Payer: PRIVATE HEALTH INSURANCE | Source: Ambulatory Visit | Attending: Obstetrics and Gynecology | Admitting: Obstetrics and Gynecology

## 2018-05-08 DIAGNOSIS — N803 Endometriosis of pelvic peritoneum: Secondary | ICD-10-CM | POA: Insufficient documentation

## 2018-05-08 DIAGNOSIS — Z6841 Body Mass Index (BMI) 40.0 and over, adult: Secondary | ICD-10-CM | POA: Diagnosis not present

## 2018-05-08 DIAGNOSIS — N979 Female infertility, unspecified: Secondary | ICD-10-CM | POA: Insufficient documentation

## 2018-05-08 DIAGNOSIS — Z79899 Other long term (current) drug therapy: Secondary | ICD-10-CM | POA: Insufficient documentation

## 2018-05-08 HISTORY — PX: CHROMOPERTUBATION: SHX6288

## 2018-05-08 HISTORY — PX: LAPAROSCOPY: SHX197

## 2018-05-08 LAB — POCT I-STAT, CHEM 8
BUN: 4 mg/dL — ABNORMAL LOW (ref 6–20)
CREATININE: 0.6 mg/dL (ref 0.44–1.00)
Calcium, Ion: 1.21 mmol/L (ref 1.15–1.40)
Chloride: 103 mmol/L (ref 98–111)
GLUCOSE: 99 mg/dL (ref 70–99)
HCT: 37 % (ref 36.0–46.0)
HEMOGLOBIN: 12.6 g/dL (ref 12.0–15.0)
Potassium: 3.7 mmol/L (ref 3.5–5.1)
Sodium: 139 mmol/L (ref 135–145)
TCO2: 23 mmol/L (ref 22–32)

## 2018-05-08 LAB — POCT PREGNANCY, URINE: PREG TEST UR: NEGATIVE

## 2018-05-08 SURGERY — LAPAROSCOPY, DIAGNOSTIC
Anesthesia: General

## 2018-05-08 MED ORDER — SOD CITRATE-CITRIC ACID 500-334 MG/5ML PO SOLN
30.0000 mL | Freq: Once | ORAL | Status: AC
  Administered 2018-05-08: 30 mL via ORAL
  Filled 2018-05-08: qty 30

## 2018-05-08 MED ORDER — FENTANYL CITRATE (PF) 250 MCG/5ML IJ SOLN
INTRAMUSCULAR | Status: AC
  Filled 2018-05-08: qty 5

## 2018-05-08 MED ORDER — SCOPOLAMINE 1 MG/3DAYS TD PT72
1.0000 | MEDICATED_PATCH | Freq: Once | TRANSDERMAL | Status: DC
  Administered 2018-05-08: 1.5 mg via TRANSDERMAL
  Filled 2018-05-08: qty 1

## 2018-05-08 MED ORDER — LACTATED RINGERS IV SOLN
INTRAVENOUS | Status: DC
  Administered 2018-05-08: 10:00:00 via INTRAVENOUS
  Filled 2018-05-08: qty 1000

## 2018-05-08 MED ORDER — ROCURONIUM BROMIDE 100 MG/10ML IV SOLN
INTRAVENOUS | Status: AC
  Filled 2018-05-08: qty 1

## 2018-05-08 MED ORDER — FAMOTIDINE 20 MG PO TABS
20.0000 mg | ORAL_TABLET | Freq: Every day | ORAL | Status: DC
  Administered 2018-05-08: 20 mg via ORAL
  Filled 2018-05-08: qty 1

## 2018-05-08 MED ORDER — MIDAZOLAM HCL 2 MG/2ML IJ SOLN
INTRAMUSCULAR | Status: AC
  Filled 2018-05-08: qty 2

## 2018-05-08 MED ORDER — MIDAZOLAM HCL 2 MG/2ML IJ SOLN
INTRAMUSCULAR | Status: DC | PRN
  Administered 2018-05-08: 2 mg via INTRAVENOUS

## 2018-05-08 MED ORDER — LACTATED RINGERS IV SOLN
INTRAVENOUS | Status: DC
  Administered 2018-05-08 (×2): via INTRAVENOUS
  Filled 2018-05-08: qty 1000

## 2018-05-08 MED ORDER — KETOROLAC TROMETHAMINE 30 MG/ML IJ SOLN
INTRAMUSCULAR | Status: DC | PRN
  Administered 2018-05-08: 30 mg via INTRAVENOUS

## 2018-05-08 MED ORDER — FENTANYL CITRATE (PF) 100 MCG/2ML IJ SOLN
INTRAMUSCULAR | Status: AC
  Filled 2018-05-08: qty 2

## 2018-05-08 MED ORDER — DEXAMETHASONE SODIUM PHOSPHATE 10 MG/ML IJ SOLN
INTRAMUSCULAR | Status: AC
  Filled 2018-05-08: qty 1

## 2018-05-08 MED ORDER — LIDOCAINE 2% (20 MG/ML) 5 ML SYRINGE
INTRAMUSCULAR | Status: AC
  Filled 2018-05-08: qty 5

## 2018-05-08 MED ORDER — FENTANYL CITRATE (PF) 100 MCG/2ML IJ SOLN
INTRAMUSCULAR | Status: DC | PRN
  Administered 2018-05-08: 25 ug via INTRAVENOUS
  Administered 2018-05-08: 50 ug via INTRAVENOUS
  Administered 2018-05-08: 25 ug via INTRAVENOUS
  Administered 2018-05-08: 50 ug via INTRAVENOUS

## 2018-05-08 MED ORDER — LIDOCAINE 2% (20 MG/ML) 5 ML SYRINGE
INTRAMUSCULAR | Status: DC | PRN
  Administered 2018-05-08: 60 mg via INTRAVENOUS

## 2018-05-08 MED ORDER — GABAPENTIN 300 MG PO CAPS
300.0000 mg | ORAL_CAPSULE | Freq: Once | ORAL | Status: AC
  Administered 2018-05-08: 300 mg via ORAL
  Filled 2018-05-08: qty 1

## 2018-05-08 MED ORDER — FENTANYL CITRATE (PF) 100 MCG/2ML IJ SOLN
25.0000 ug | INTRAMUSCULAR | Status: DC | PRN
  Administered 2018-05-08 (×3): 50 ug via INTRAVENOUS
  Filled 2018-05-08: qty 1

## 2018-05-08 MED ORDER — ROCURONIUM BROMIDE 10 MG/ML (PF) SYRINGE
PREFILLED_SYRINGE | INTRAVENOUS | Status: DC | PRN
  Administered 2018-05-08: 50 mg via INTRAVENOUS

## 2018-05-08 MED ORDER — DEXAMETHASONE SODIUM PHOSPHATE 10 MG/ML IJ SOLN
INTRAMUSCULAR | Status: DC | PRN
  Administered 2018-05-08: 10 mg via INTRAVENOUS

## 2018-05-08 MED ORDER — OXYCODONE HCL 5 MG PO TABS
5.0000 mg | ORAL_TABLET | Freq: Once | ORAL | Status: AC
  Administered 2018-05-08: 5 mg via ORAL
  Filled 2018-05-08: qty 1

## 2018-05-08 MED ORDER — PROPOFOL 10 MG/ML IV BOLUS
INTRAVENOUS | Status: DC | PRN
  Administered 2018-05-08: 200 mg via INTRAVENOUS

## 2018-05-08 MED ORDER — SCOPOLAMINE 1 MG/3DAYS TD PT72
MEDICATED_PATCH | TRANSDERMAL | Status: AC
  Filled 2018-05-08: qty 1

## 2018-05-08 MED ORDER — FAMOTIDINE 20 MG PO TABS
ORAL_TABLET | ORAL | Status: AC
  Filled 2018-05-08: qty 1

## 2018-05-08 MED ORDER — SUGAMMADEX SODIUM 200 MG/2ML IV SOLN
INTRAVENOUS | Status: AC
  Filled 2018-05-08: qty 2

## 2018-05-08 MED ORDER — ONDANSETRON HCL 4 MG/2ML IJ SOLN
INTRAMUSCULAR | Status: AC
  Filled 2018-05-08: qty 2

## 2018-05-08 MED ORDER — PROPOFOL 10 MG/ML IV BOLUS
INTRAVENOUS | Status: AC
  Filled 2018-05-08: qty 40

## 2018-05-08 MED ORDER — ONDANSETRON HCL 4 MG/2ML IJ SOLN
INTRAMUSCULAR | Status: DC | PRN
  Administered 2018-05-08: 4 mg via INTRAVENOUS

## 2018-05-08 MED ORDER — PROMETHAZINE HCL 25 MG/ML IJ SOLN
6.2500 mg | INTRAMUSCULAR | Status: DC | PRN
  Filled 2018-05-08: qty 1

## 2018-05-08 MED ORDER — SUGAMMADEX SODIUM 200 MG/2ML IV SOLN
INTRAVENOUS | Status: DC | PRN
  Administered 2018-05-08: 230 mg via INTRAVENOUS

## 2018-05-08 MED ORDER — OXYCODONE HCL 5 MG PO TABS
ORAL_TABLET | ORAL | Status: AC
  Filled 2018-05-08: qty 1

## 2018-05-08 MED ORDER — ACETAMINOPHEN 500 MG PO TABS
ORAL_TABLET | ORAL | Status: AC
  Filled 2018-05-08: qty 2

## 2018-05-08 MED ORDER — ACETAMINOPHEN 500 MG PO TABS
1000.0000 mg | ORAL_TABLET | Freq: Once | ORAL | Status: AC
  Administered 2018-05-08: 1000 mg via ORAL
  Filled 2018-05-08: qty 2

## 2018-05-08 MED ORDER — SODIUM CHLORIDE 0.9 % IR SOLN
Status: DC | PRN
  Administered 2018-05-08: 1000 mL

## 2018-05-08 MED ORDER — SOD CITRATE-CITRIC ACID 500-334 MG/5ML PO SOLN
ORAL | Status: AC
  Filled 2018-05-08: qty 15

## 2018-05-08 MED ORDER — GABAPENTIN 300 MG PO CAPS
ORAL_CAPSULE | ORAL | Status: AC
  Filled 2018-05-08: qty 1

## 2018-05-08 MED ORDER — KETOROLAC TROMETHAMINE 30 MG/ML IJ SOLN
INTRAMUSCULAR | Status: AC
  Filled 2018-05-08: qty 1

## 2018-05-08 MED ORDER — BUPIVACAINE HCL (PF) 0.25 % IJ SOLN
INTRAMUSCULAR | Status: DC | PRN
  Administered 2018-05-08: 6 mL

## 2018-05-08 MED ORDER — WHITE PETROLATUM EX OINT
TOPICAL_OINTMENT | CUTANEOUS | Status: AC
  Filled 2018-05-08: qty 5

## 2018-05-08 SURGICAL SUPPLY — 42 items
APPLICATOR ARISTA FLEXITIP XL (MISCELLANEOUS) IMPLANT
APPLICATOR COTTON TIP 6IN STRL (MISCELLANEOUS) IMPLANT
BAG RETRIEVAL 10MM (BASKET)
BLADE CLIPPER SURG (BLADE) IMPLANT
CANISTER SUCT 3000ML PPV (MISCELLANEOUS) ×3 IMPLANT
CATH ROBINSON RED A/P 16FR (CATHETERS) ×3 IMPLANT
CLOSURE WOUND 1/4X4 (GAUZE/BANDAGES/DRESSINGS)
COVER MAYO STAND STRL (DRAPES) ×3 IMPLANT
DERMABOND ADVANCED (GAUZE/BANDAGES/DRESSINGS) ×2
DERMABOND ADVANCED .7 DNX12 (GAUZE/BANDAGES/DRESSINGS) ×1 IMPLANT
DRSG COVADERM PLUS 2X2 (GAUZE/BANDAGES/DRESSINGS) IMPLANT
DRSG OPSITE POSTOP 3X4 (GAUZE/BANDAGES/DRESSINGS) IMPLANT
DRSG TELFA 3X8 NADH (GAUZE/BANDAGES/DRESSINGS) IMPLANT
ELECT REM PT RETURN 9FT ADLT (ELECTROSURGICAL) ×3
ELECTRODE REM PT RTRN 9FT ADLT (ELECTROSURGICAL) ×1 IMPLANT
GLOVE BIO SURGEON STRL SZ 6.5 (GLOVE) ×4 IMPLANT
GLOVE BIO SURGEONS STRL SZ 6.5 (GLOVE) ×2
GOWN STRL REUS W/ TWL LRG LVL3 (GOWN DISPOSABLE) ×2 IMPLANT
GOWN STRL REUS W/TWL LRG LVL3 (GOWN DISPOSABLE) ×4
HEMOSTAT ARISTA ABSORB 3G PWDR (MISCELLANEOUS) IMPLANT
KIT TURNOVER CYSTO (KITS) ×3 IMPLANT
NEEDLE HYPO 25X1 1.5 SAFETY (NEEDLE) ×3 IMPLANT
NEEDLE INSUFFLATION 120MM (ENDOMECHANICALS) ×3 IMPLANT
NS IRRIG 500ML POUR BTL (IV SOLUTION) ×3 IMPLANT
PACK LAPAROSCOPY BASIN (CUSTOM PROCEDURE TRAY) ×3 IMPLANT
PACK TRENDGUARD 450 HYBRID PRO (MISCELLANEOUS) ×1 IMPLANT
PAD OB MATERNITY 4.3X12.25 (PERSONAL CARE ITEMS) ×3 IMPLANT
PAD PREP 24X48 CUFFED NSTRL (MISCELLANEOUS) ×3 IMPLANT
SCISSORS LAP 5X35 DISP (ENDOMECHANICALS) IMPLANT
STRIP CLOSURE SKIN 1/4X4 (GAUZE/BANDAGES/DRESSINGS) IMPLANT
SUT VIC AB 3-0 PS2 18 (SUTURE) ×2
SUT VIC AB 3-0 PS2 18XBRD (SUTURE) ×1 IMPLANT
SUT VICRYL 0 UR6 27IN ABS (SUTURE) ×3 IMPLANT
SYS BAG RETRIEVAL 10MM (BASKET)
SYSTEM BAG RETRIEVAL 10MM (BASKET) IMPLANT
TOWEL OR 17X24 6PK STRL BLUE (TOWEL DISPOSABLE) ×6 IMPLANT
TRAY FOLEY W/BAG SLVR 14FR (SET/KITS/TRAYS/PACK) IMPLANT
TRENDGUARD 450 HYBRID PRO PACK (MISCELLANEOUS) ×3
TROCAR OPTI TIP 5M 100M (ENDOMECHANICALS) ×3 IMPLANT
TROCAR XCEL DIL TIP R 11M (ENDOMECHANICALS) ×3 IMPLANT
TUBING INSUF HEATED (TUBING) ×3 IMPLANT
WARMER LAPAROSCOPE (MISCELLANEOUS) ×3 IMPLANT

## 2018-05-08 NOTE — Progress Notes (Signed)
H and P on the chart No changes Will proceed with LSC, Chromopertubation Risks reviewed Consent signed

## 2018-05-08 NOTE — Anesthesia Postprocedure Evaluation (Signed)
Anesthesia Post Note  Patient: Mikayla Briggs  Procedure(s) Performed: LAPAROSCOPY DIAGNOSTIC (N/A ) CHROMOPERTUBATION (N/A )     Patient location during evaluation: PACU Anesthesia Type: General Level of consciousness: awake and alert, awake and oriented Pain management: pain level controlled Vital Signs Assessment: post-procedure vital signs reviewed and stable Respiratory status: spontaneous breathing, nonlabored ventilation and respiratory function stable Cardiovascular status: blood pressure returned to baseline and stable Postop Assessment: no apparent nausea or vomiting Anesthetic complications: no    Last Vitals:  Vitals:   05/08/18 0915 05/08/18 1140  BP: 107/73 121/80  Pulse: 82 65  Resp: 16 13  Temp:  36.8 C  SpO2: 93% 100%    Last Pain:  Vitals:   05/08/18 1130  TempSrc:   PainSc: 3                  Cecile HearingStephen Edward Willim Turnage

## 2018-05-08 NOTE — Transfer of Care (Signed)
Immediate Anesthesia Transfer of Care Note  Patient: Mikayla Briggs  Procedure(s) Performed: Procedure(s) (LRB): LAPAROSCOPY DIAGNOSTIC (N/A) CHROMOPERTUBATION (N/A)  Patient Location: PACU  Anesthesia Type: General  Level of Consciousness: awake, oriented, sedated and patient cooperative  Airway & Oxygen Therapy: Patient Spontanous Breathing and Patient connected to face mask oxygen  Post-op Assessment: Report given to PACU RN and Post -op Vital signs reviewed and stable  Post vital signs: Reviewed and stable  Complications: No apparent anesthesia complications Last Vitals:  Vitals Value Taken Time  BP 121/72 05/08/2018  8:30 AM  Temp    Pulse 73 05/08/2018  8:33 AM  Resp 12 05/08/2018  8:33 AM  SpO2 100 % 05/08/2018  8:33 AM  Vitals shown include unvalidated device data.  Last Pain:  Vitals:   05/08/18 0541  TempSrc: Oral

## 2018-05-08 NOTE — Discharge Instructions (Signed)

## 2018-05-08 NOTE — Brief Op Note (Signed)
05/08/2018  8:20 AM  PATIENT:  Mikayla Briggs  37 y.o. female  PRE-OPERATIVE DIAGNOSIS:  Primary Infertility   POST-OPERATIVE DIAGNOSIS:  1. Primary infertility  2.  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 Patent left fallopian tube 4. Slow fill and spill of right fallopian tube  PROCEDURE: Diagnostic Laparoscopy Chromopertubation Fulguration of endometriotic implants   SURGEON:  Surgeon(s) and Role:    * Marcelle OverlieGrewal, Rashema Seawright, MD - Primary  PHYSICIAN ASSISTANT:   ASSISTANTS: none   ANESTHESIA:   local and general  EBL:  10 mL   BLOOD ADMINISTERED:none  DRAINS: none   LOCAL MEDICATIONS USED:  LIDOCAINE   SPECIMEN:  No Specimen  DISPOSITION OF SPECIMEN:  N/A  COUNTS:  YES  TOURNIQUET:  * No tourniquets in log *  DICTATION: .Other Dictation: Dictation Number DICTATED  PLAN OF CARE: Discharge to home after PACU  PATIENT DISPOSITION:  PACU - hemodynamically stable.   Delay start of Pharmacological VTE agent (>24hrs) due to surgical blood loss or risk of bleeding: yes

## 2018-05-08 NOTE — Anesthesia Procedure Notes (Signed)
Procedure Name: Intubation Date/Time: 05/08/2018 7:34 AM Performed by: Suan Halter, CRNA Pre-anesthesia Checklist: Patient identified, Emergency Drugs available, Suction available and Patient being monitored Patient Re-evaluated:Patient Re-evaluated prior to induction Oxygen Delivery Method: Circle system utilized Preoxygenation: Pre-oxygenation with 100% oxygen Induction Type: IV induction Ventilation: Mask ventilation without difficulty Laryngoscope Size: Mac and 3 Grade View: Grade I Tube type: Oral Tube size: 7.0 mm Number of attempts: 1 Airway Equipment and Method: Stylet and Oral airway Placement Confirmation: ETT inserted through vocal cords under direct vision,  positive ETCO2 and breath sounds checked- equal and bilateral Secured at: 22 cm Tube secured with: Tape Dental Injury: Teeth and Oropharynx as per pre-operative assessment

## 2018-05-08 NOTE — Anesthesia Preprocedure Evaluation (Addendum)
Anesthesia Evaluation  Patient identified by MRN, date of birth, ID band Patient awake    Reviewed: Allergy & Precautions, NPO status , Patient's Chart, lab work & pertinent test results  Airway Mallampati: III  TM Distance: >3 FB Neck ROM: Full    Dental  (+) Teeth Intact, Dental Advisory Given   Pulmonary neg pulmonary ROS,    Pulmonary exam normal breath sounds clear to auscultation       Cardiovascular Exercise Tolerance: Good negative cardio ROS Normal cardiovascular exam Rhythm:Regular Rate:Normal     Neuro/Psych  Headaches,    GI/Hepatic negative GI ROS, Neg liver ROS,   Endo/Other  Morbid obesity  Renal/GU negative Renal ROS     Musculoskeletal negative musculoskeletal ROS (+)   Abdominal   Peds  Hematology negative hematology ROS (+)   Anesthesia Other Findings Day of surgery medications reviewed with the patient.  Reproductive/Obstetrics Endometriosis                             Anesthesia Physical Anesthesia Plan  ASA: III  Anesthesia Plan: General   Post-op Pain Management:    Induction: Intravenous  PONV Risk Score and Plan: 4 or greater and Scopolamine patch - Pre-op, Midazolam, Ondansetron, Dexamethasone and Diphenhydramine  Airway Management Planned: Oral ETT  Additional Equipment:   Intra-op Plan:   Post-operative Plan: Extubation in OR  Informed Consent: I have reviewed the patients History and Physical, chart, labs and discussed the procedure including the risks, benefits and alternatives for the proposed anesthesia with the patient or authorized representative who has indicated his/her understanding and acceptance.   Dental advisory given  Plan Discussed with: CRNA  Anesthesia Plan Comments:        Anesthesia Quick Evaluation

## 2018-05-09 ENCOUNTER — Encounter (HOSPITAL_BASED_OUTPATIENT_CLINIC_OR_DEPARTMENT_OTHER): Payer: Self-pay | Admitting: Obstetrics and Gynecology

## 2018-05-10 NOTE — Op Note (Signed)
NAME: Germain OsgoodHOMPSON-BROWN, Starly[facility] LPMICHELLE MEDICAL RECORD [id number] ACCOUNT [id number] DATE OF BIRTH:April 09, 1981 FACILITY: [facility] LOCATION: [facility] PHYSICIAN:Anesha Hackert Rosita FireL. Ethelda Deangelo, MD  OPERATIVE REPORT  DATE OF PROCEDURE:  05/08/2018  PREOPERATIVE DIAGNOSIS:  Primary infertility, endometriosis of the pelvis.  POSTOPERATIVE DIAGNOSIS:  Primary infertility, endometriosis of the pelvis.  PROCEDURES:   1.  Diagnostic laparoscopy,   2.  Fulguration of endometriotic implants. 3.  Chromopertubation.  SURGEON:  Marcelle OverlieMichelle Trejon Duford.  ANESTHESIA:  General.  ESTIMATED BLOOD LOSS:  Less than 10 mL.  COMPLICATIONS:  None.  DRAINS:  None.  DESCRIPTION OF PROCEDURE:  The patient was taken to the operating room after she was appropriately consented and all risks of the procedure were discussed with the patient.  She was intubated and a tilt test was performed.  She was prepped and draped and  placed in the lithotomy position.  The uterine manipulator was inserted with an acorn cannula, so that I could perform the chromopertubation.  Attention was turned to the abdomen, where a small incision was made at the umbilicus.  The Veress needle was  inserted.  Pneumoperitoneum was performed easily.  The Veress needle was removed and an 11 mm trocar was introduced into the peritoneal cavity.  The camera was introduced through the trocar sheath.  The patient was gently placed in Trendelenburg  position.  We then inspected the pelvis and revealed the following:  Her uterus appeared of normal, of normal size and contour.  Her ovaries were unremarkable.  There were no adhesions and no brownish deposits on the surface.  There were 3 endometriotic  powder burn implants on the vesicouterine peritoneum more towards the left of the patient and using the Kleppinger forceps, I was able to fulgurate the endometriosis.  The fallopian tubes were inspected as well and using chromopertubation, I could see  normal fill and spill of  the left fallopian tube, but on the right fallopian tube, I could see that the tube distended and the distal 2/3 of the tube appeared slightly swollen, but I could never confirm an actual spillage on that side.  I even moved the  tube around and stretched it out to see if that would help, but if there is any spillage there,  It would have been very minor.  I do think there is some delayed spillage on the right fallopian tube.  After all this was done, of course photographs were  taken during this time, I do believe from inspection feel that her left fallopian tube would be easier to conceive than the right.  This will be forwarded to Dr. August Saucerean, her infertility physician.  The pneumoperitoneum was released.  The trocar was  removed.  All instruments were removed from the patient and from the vagina.  She was extubated and she went to recovery room in stable condition.  Prior to that, her incision was closed with 3-0 Vicryl and Dermabond was applied.  All sponge, lap counts  were correct.  AN/NUANCE  D:05/10/2018 T:05/10/2018 JOB:[id number]

## 2018-06-14 ENCOUNTER — Other Ambulatory Visit: Payer: Self-pay | Admitting: Obstetrics and Gynecology

## 2018-06-14 ENCOUNTER — Ambulatory Visit
Admission: RE | Admit: 2018-06-14 | Discharge: 2018-06-14 | Disposition: A | Discharge status: Home / Self Care | Payer: PRIVATE HEALTH INSURANCE | Source: Ambulatory Visit | Attending: Obstetrics and Gynecology | Admitting: Obstetrics and Gynecology

## 2018-06-14 DIAGNOSIS — M25551 Pain in right hip: Secondary | ICD-10-CM

## 2018-06-22 ENCOUNTER — Ambulatory Visit: Payer: PRIVATE HEALTH INSURANCE | Admitting: Internal Medicine

## 2018-07-07 ENCOUNTER — Ambulatory Visit
Admission: RE | Admit: 2018-07-07 | Discharge: 2018-07-07 | Disposition: A | Discharge status: Home / Self Care | Payer: PRIVATE HEALTH INSURANCE | Source: Ambulatory Visit | Attending: Orthopedic Surgery | Admitting: Orthopedic Surgery

## 2018-07-07 ENCOUNTER — Other Ambulatory Visit: Payer: Self-pay | Admitting: Orthopedic Surgery

## 2018-07-07 ENCOUNTER — Other Ambulatory Visit: Payer: Self-pay

## 2018-07-07 DIAGNOSIS — M25551 Pain in right hip: Secondary | ICD-10-CM

## 2018-07-07 DIAGNOSIS — Z9109 Other allergy status, other than to drugs and biological substances: Secondary | ICD-10-CM

## 2018-07-07 MED ORDER — LEVOCETIRIZINE DIHYDROCHLORIDE 5 MG PO TABS: ORAL_TABLET | ORAL | 0 refills | Status: DC

## 2018-07-07 MED ORDER — MONTELUKAST SODIUM 10 MG PO TABS: 10.0000 mg | ORAL_TABLET | Freq: Every day | ORAL | 0 refills | Status: DC

## 2018-07-07 MED ORDER — METHYLPREDNISOLONE ACETATE 40 MG/ML INJ SUSP (RADIOLOG
120.0000 mg | Freq: Once | INTRAMUSCULAR | Status: AC
  Administered 2018-07-07: 120 mg via INTRA_ARTICULAR

## 2018-07-09 ENCOUNTER — Encounter: Payer: Self-pay | Admitting: Internal Medicine

## 2018-07-09 DIAGNOSIS — Z9109 Other allergy status, other than to drugs and biological substances: Secondary | ICD-10-CM

## 2018-07-10 MED ORDER — MONTELUKAST SODIUM 10 MG PO TABS: 10.0000 mg | ORAL_TABLET | Freq: Every day | ORAL | 0 refills | Status: DC

## 2018-07-10 MED ORDER — LEVOCETIRIZINE DIHYDROCHLORIDE 5 MG PO TABS: ORAL_TABLET | ORAL | 0 refills | Status: DC

## 2018-07-11 ENCOUNTER — Ambulatory Visit: Payer: PRIVATE HEALTH INSURANCE | Admitting: Internal Medicine

## 2018-07-20 ENCOUNTER — Encounter: Payer: Self-pay | Admitting: Internal Medicine

## 2018-07-20 ENCOUNTER — Ambulatory Visit (INDEPENDENT_AMBULATORY_CARE_PROVIDER_SITE_OTHER): Payer: PRIVATE HEALTH INSURANCE | Admitting: Internal Medicine

## 2018-07-20 VITALS — BP 122/78 | HR 82 | Temp 98.2°F | Ht 64.5 in | Wt 255.0 lb

## 2018-07-20 DIAGNOSIS — R519 Headache, unspecified: Secondary | ICD-10-CM

## 2018-07-20 DIAGNOSIS — R51 Headache: Secondary | ICD-10-CM

## 2018-07-20 DIAGNOSIS — Z0000 Encounter for general adult medical examination without abnormal findings: Secondary | ICD-10-CM | POA: Diagnosis not present

## 2018-07-20 DIAGNOSIS — N926 Irregular menstruation, unspecified: Secondary | ICD-10-CM | POA: Diagnosis not present

## 2018-07-20 DIAGNOSIS — Z9109 Other allergy status, other than to drugs and biological substances: Secondary | ICD-10-CM

## 2018-07-20 DIAGNOSIS — N979 Female infertility, unspecified: Secondary | ICD-10-CM | POA: Diagnosis not present

## 2018-07-20 LAB — COMPREHENSIVE METABOLIC PANEL
ALBUMIN: 4.3 g/dL (ref 3.5–5.2)
ALT: 15 U/L (ref 0–35)
AST: 19 U/L (ref 0–37)
Alkaline Phosphatase: 45 U/L (ref 39–117)
BILIRUBIN TOTAL: 0.6 mg/dL (ref 0.2–1.2)
BUN: 9 mg/dL (ref 6–23)
CALCIUM: 9.5 mg/dL (ref 8.4–10.5)
CO2: 27 meq/L (ref 19–32)
CREATININE: 0.7 mg/dL (ref 0.40–1.20)
Chloride: 103 mEq/L (ref 96–112)
GFR: 100.02 mL/min (ref >60.00)
Glucose, Bld: 95 mg/dL (ref 70–99)
Potassium: 4 mEq/L (ref 3.5–5.1)
Sodium: 136 mEq/L (ref 135–145)
Total Protein: 7.2 g/dL (ref 6.0–8.3)

## 2018-07-20 LAB — CBC
HCT: 38.5 % (ref 36.0–46.0)
HEMOGLOBIN: 12.8 g/dL (ref 12.0–15.0)
MCHC: 33.3 g/dL (ref 30.0–36.0)
MCV: 86 fl (ref 78.0–100.0)
PLATELETS: 295 10*3/uL (ref 150.0–400.0)
RBC: 4.47 Mil/uL (ref 3.87–5.11)
RDW: 14.2 % (ref 11.5–15.5)
WBC: 10.5 10*3/uL (ref 4.0–10.5)

## 2018-07-20 LAB — HEMOGLOBIN A1C: HEMOGLOBIN A1C: 5.6 % (ref 4.6–6.5)

## 2018-07-20 LAB — HCG, QUANTITATIVE, PREGNANCY: QUANTITATIVE HCG: 264.25 m[IU]/mL

## 2018-07-20 LAB — TSH: TSH: 1.27 u[IU]/mL (ref 0.35–4.50)

## 2018-07-20 NOTE — Progress Notes (Signed)
Subjective:    Patient ID: Mikayla Briggs, female    DOB: 09/26/1980, 37 y.o.   MRN: [id number]  HPI  Patient presents to the clinic today for her annual exam.  She is also due to follow-up chronic conditions.  Frequent Headaches: She reports she got a daith piercing about 2 months ago and her headaches have improved significantly.  She takes Fioricet as needed with good relief.  Seasonal Allergies: Seem to be occurring all year long.  Controlled on Zyrtec, Xyzal and Singulair.  She would like refills of these medications today.  Infertility: She reports her work-up has been negative so far.  She reports she is 2 days late on her menses and would like a blood pregnancy test today. She continues to follow with gyn.  Flu: 06/2018 Tetanus :2015 Pap Smear: 06/2018 Dentist: biannually  Diet: She does eat meat.  She consumes fruits and veggies daily.  She occasionally eats fried foods.  She drinks mostly water, coffee. Exercise: None  Review of Systems      Past Medical History:  Diagnosis Date  . 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 Migraines   . 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 Seasonal allergies   . Wears contact lenses     Current Outpatient Medications  Medication Sig Dispense Refill  . betamethasone dipropionate (DIPROLENE) 0.05 % cream Apply topically 2 (two) times daily as needed. (Patient taking differently: Apply 1 application topically 2 (two) times daily as needed (eczema). ) 30 g 0  . butalbital-acetaminophen-caffeine (FIORICET, ESGIC) 50-325-40 MG tablet Take 1 tablet by mouth 2 (two) times daily as needed for headache.    . cetirizine (ZYRTEC) 10 MG tablet Take 10 mg by mouth daily.    Marland Kitchen levocetirizine (XYZAL) 5 MG tablet TAKE ONE TABLET BY MOUTH EACH EVENING 30 tablet 0  . montelukast (SINGULAIR) 10 MG tablet Take 1 tablet (10 mg total) by mouth  at bedtime. 30 tablet 0  . Multiple Vitamin (MULTIVITAMIN WITH MINERALS) TABS tablet Take 1 tablet by mouth at bedtime.    . pyridOXINE (VITAMIN B-6) 50 MG tablet Take 1 tablet (50 mg total) by mouth every 8 (eight) hours as needed. (Patient taking differently: Take 50 mg by mouth daily as needed (stomach pain). ) 20 tablet 0   No current facility-administered medications for this visit.     Allergies  Allergen Reactions  . Iodine Itching and Swelling  . 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   . Omeprazole Anaphylaxis and Shortness Of Breath  . Penicillins Anaphylaxis and Shortness Of Breath    Has patient had a PCN reaction causing immediate rash, facial/tongue/throat swelling, SOB or lightheadedness with hypotension: Yes Has patient had a PCN reaction causing severe rash involving mucus membranes or skin necrosis: No Has patient had a PCN reaction that required hospitalization: Unknown Has patient had a PCN reaction occurring within the last 10 years: No If all of the above answers are "NO", then may proceed with Cephalosporin use.   . Shellfish Allergy Anaphylaxis and Shortness Of Breath  . Soap Itching    ANYTHING WITH FRAGRANCE    Family History  Problem Relation Age of Onset  . Arthritis Mother   . Ovarian cancer Mother   .  Hyperlipidemia Mother   . Hypertension Mother   . Irritable bowel syndrome Mother   . Arthritis Father   . Hypertension Father   . Asthma Father   . Asthma Sister   . Arthritis Paternal Grandmother     Social History   Socioeconomic History  . Marital status: Married    Spouse name: Marc Morgans  . Number of children: 0  . Years of education: Not on file  . Highest education level: Not on file  Occupational History  . Occupation: CNA/ CPT  Social Needs  . Financial resource strain: Not on file  . Food insecurity:    Worry: Not on file     Inability: Not on file  . Transportation needs:    Medical: Not on file    Non-medical: Not on file  Tobacco Use  . Smoking status: Never Smoker  . Smokeless tobacco: Never Used  Substance and Sexual Activity  . Alcohol use: Yes    Alcohol/week: 0.0 standard drinks    Comment: occasionally  . Drug use: No  . Sexual activity: Yes    Birth control/protection: None  Lifestyle  . Physical activity:    Days per week: Not on file    Minutes per session: Not on file  . Stress: Not on file  Relationships  . Social connections:    Talks on phone: Not on file    Gets together: Not on file    Attends religious service: Not on file    Active member of club or organization: Not on file    Attends meetings of clubs or organizations: Not on file    Relationship status: Not on file  . Intimate partner violence:    Fear of current or ex partner: Not on file    Emotionally abused: Not on file    Physically abused: Not on file    Forced sexual activity: Not on file  Other Topics Concern  . Not on file  Social History Narrative  . Not on file     Constitutional: Patient reports intermittent headache.  Denies fever, malaise, fatigue, or abrupt weight changes.  HEENT: Denies eye pain, eye redness, ear pain, ringing in the ears, wax buildup, runny nose, nasal congestion, bloody nose, or sore throat. Respiratory: Denies difficulty breathing, shortness of breath, cough or sputum production.   Cardiovascular: Denies chest pain, chest tightness, palpitations or swelling in the hands or feet.  Gastrointestinal: Denies abdominal pain, bloating, constipation, diarrhea or blood in the stool.  GU: Patient reports light menses.  Denies urgency, frequency, pain with urination, burning sensation, blood in urine, odor or discharge. Musculoskeletal: Denies decrease in range of motion, difficulty with gait, muscle pain or joint pain and swelling.  Skin: Denies redness, rashes, lesions or ulcercations.    Neurological: Denies dizziness, difficulty with memory, difficulty with speech or problems with balance and coordination.  Psych: Denies anxiety, depression, SI/HI.  No other specific complaints in a complete review of systems (except as listed in HPI above).  Objective:   Physical Exam  BP 122/78   Pulse 82   Temp 98.2 F (36.8 C) (Oral)   Ht 5' 4.5" (1.638 m)   Wt 255 lb (115.7 kg)   LMP 06/23/2018 (Approximate)   SpO2 98%   BMI 43.09 kg/m  Wt Readings from Last 3 Encounters:  07/20/18 255 lb (115.7 kg)  05/08/18 253 lb 11.2 oz (115.1 kg)  05/01/18 253 lb 8 oz (115 kg)    General: Appears her stated  age, obese in NAD. Skin: Warm, dry and intact. No rashes, lesions or ulcerations noted. HEENT: Head: normal shape and size; Eyes: sclera white, no icterus, conjunctiva pink, PERRLA and EOMs intact; Ears: Tm's gray and intact, normal light reflex;  Throat/Mouth: Teeth present, mucosa pink and moist, no exudate, lesions or ulcerations noted.  Neck:  Neck supple, trachea midline. No masses, lumps or thyromegaly present.  Cardiovascular: Normal rate and rhythm. S1,S2 noted.  No murmur, rubs or gallops noted. No JVD or BLE edema.  Pulmonary/Chest: Normal effort and positive vesicular breath sounds. No respiratory distress. No wheezes, rales or ronchi noted.  Abdomen: Soft and nontender. Normal bowel sounds. No distention or masses noted. Liver, spleen and kidneys non palpable. Musculoskeletal: Strength 5/5 BUE/BLE. No difficulty with gait.  Neurological: Alert and oriented. Cranial nerves II-XII grossly intact. Coordination normal.  Psychiatric: Mood and affect normal. Behavior is normal. Judgment and thought content normal.     BMET    Component Value Date/Time   NA 139 05/08/2018 0609   NA 137 01/02/2014 1152   K 3.7 05/08/2018 0609   K 3.9 01/02/2014 1152   CL 103 05/08/2018 0609   CL 102 01/02/2014 1152   CO2 26 03/05/2018 2321   CO2 26 01/02/2014 1152   GLUCOSE 99  05/08/2018 0609   GLUCOSE 105 (H) 01/02/2014 1152   BUN 4 (L) 05/08/2018 0609   BUN 9 01/02/2014 1152   CREATININE 0.60 05/08/2018 0609   CREATININE 0.81 01/02/2014 1152   CALCIUM 9.2 03/05/2018 2321   CALCIUM 8.8 01/02/2014 1152   GFRNONAA >60 03/05/2018 2321   GFRNONAA >60 01/02/2014 1152   GFRAA >60 03/05/2018 2321   GFRAA >60 01/02/2014 1152    Lipid Panel     Component Value Date/Time   CHOL 144 07/05/2017 1306   TRIG 113.0 07/05/2017 1306   HDL 42.00 07/05/2017 1306   CHOLHDL 3 07/05/2017 1306   VLDL 22.6 07/05/2017 1306   LDLCALC 80 07/05/2017 1306    CBC    Component Value Date/Time   WBC 11.6 (H) 05/01/2018 1135   RBC 4.21 05/01/2018 1135   HGB 12.6 05/08/2018 0609   HGB 14.5 01/02/2014 1152   HCT 37.0 05/08/2018 0609   HCT 43.6 01/02/2014 1152   PLT 282 05/01/2018 1135   PLT 280 01/02/2014 1152   MCV 84.1 05/01/2018 1135   MCV 90 01/02/2014 1152   MCH 27.6 05/01/2018 1135   MCHC 32.8 05/01/2018 1135   RDW 14.8 05/01/2018 1135   RDW 12.7 01/02/2014 1152   LYMPHSABS 2.7 07/20/2016 1101   LYMPHSABS 2.4 01/02/2014 1152   MONOABS 0.7 07/20/2016 1101   MONOABS 0.7 01/02/2014 1152   EOSABS 0.6 07/20/2016 1101   EOSABS 0.4 01/02/2014 1152   BASOSABS 0.0 07/20/2016 1101   BASOSABS 0.1 01/02/2014 1152    Hgb A1C Lab Results  Component Value Date   HGBA1C 5.5 07/05/2017            Assessment & Plan:   Preventative Health Maintenance:  Flu UTD Tetanus UTD Pap smear UTD Encouraged her to consume a balanced diet and exercise regimen Advised her to see a dentist at least annually Copy of most recent lipid profile reviewed and scanned to chart Will check CBC, C met, TSH, A1c and serum hCG today  RTC in 1 year sooner, if needed Webb Silversmith, NP

## 2018-07-20 NOTE — Patient Instructions (Signed)

## 2018-07-21 ENCOUNTER — Encounter: Payer: Self-pay | Admitting: Internal Medicine

## 2018-07-21 DIAGNOSIS — R519 Headache, unspecified: Secondary | ICD-10-CM | POA: Insufficient documentation

## 2018-07-21 DIAGNOSIS — G43909 Migraine, unspecified, not intractable, without status migrainosus: Secondary | ICD-10-CM | POA: Insufficient documentation

## 2018-07-21 DIAGNOSIS — N979 Female infertility, unspecified: Secondary | ICD-10-CM | POA: Insufficient documentation

## 2018-07-21 DIAGNOSIS — R51 Headache: Secondary | ICD-10-CM

## 2018-07-21 MED ORDER — CETIRIZINE HCL 10 MG PO TABS: 10.0000 mg | ORAL_TABLET | Freq: Every day | ORAL | 3 refills | Status: DC

## 2018-07-21 MED ORDER — BUTALBITAL-APAP-CAFFEINE 50-325-40 MG PO TABS: 1.0000 | ORAL_TABLET | Freq: Two times a day (BID) | ORAL | 5 refills | Status: DC | PRN

## 2018-07-21 MED ORDER — MONTELUKAST SODIUM 10 MG PO TABS: 10.0000 mg | ORAL_TABLET | Freq: Every day | ORAL | 3 refills | Status: DC

## 2018-07-21 MED ORDER — LEVOCETIRIZINE DIHYDROCHLORIDE 5 MG PO TABS: ORAL_TABLET | ORAL | 3 refills | Status: DC

## 2018-07-21 NOTE — Assessment & Plan Note (Signed)
Improved with daith piercing Continue Fioricet as needed, refilled today

## 2018-07-21 NOTE — Assessment & Plan Note (Signed)
Serum hCG today She will continue to follow with GYN

## 2018-07-21 NOTE — Assessment & Plan Note (Signed)
Controlled on multiple meds, refill today Will monitor

## 2018-08-06 ENCOUNTER — Encounter (HOSPITAL_COMMUNITY): Payer: Self-pay

## 2018-08-06 ENCOUNTER — Emergency Department (HOSPITAL_COMMUNITY)
Admission: EM | Admit: 2018-08-06 | Discharge: 2018-08-06 | Disposition: A | Discharge status: Home / Self Care | Payer: PRIVATE HEALTH INSURANCE | Attending: Emergency Medicine | Admitting: Emergency Medicine

## 2018-08-06 DIAGNOSIS — Z3A01 Less than 8 weeks gestation of pregnancy: Secondary | ICD-10-CM | POA: Insufficient documentation

## 2018-08-06 DIAGNOSIS — M25511 Pain in right shoulder: Secondary | ICD-10-CM | POA: Diagnosis not present

## 2018-08-06 DIAGNOSIS — R55 Syncope and collapse: Secondary | ICD-10-CM

## 2018-08-06 DIAGNOSIS — R6884 Jaw pain: Secondary | ICD-10-CM

## 2018-08-06 DIAGNOSIS — W010XXA Fall on same level from slipping, tripping and stumbling without subsequent striking against object, initial encounter: Secondary | ICD-10-CM | POA: Diagnosis not present

## 2018-08-06 DIAGNOSIS — O219 Vomiting of pregnancy, unspecified: Secondary | ICD-10-CM | POA: Insufficient documentation

## 2018-08-06 DIAGNOSIS — W19XXXA Unspecified fall, initial encounter: Secondary | ICD-10-CM

## 2018-08-06 DIAGNOSIS — Z79899 Other long term (current) drug therapy: Secondary | ICD-10-CM | POA: Insufficient documentation

## 2018-08-06 DIAGNOSIS — O9989 Other specified diseases and conditions complicating pregnancy, childbirth and the puerperium: Secondary | ICD-10-CM | POA: Insufficient documentation

## 2018-08-06 DIAGNOSIS — O26811 Pregnancy related exhaustion and fatigue, first trimester: Secondary | ICD-10-CM | POA: Insufficient documentation

## 2018-08-06 DIAGNOSIS — M542 Cervicalgia: Secondary | ICD-10-CM

## 2018-08-06 NOTE — Discharge Instructions (Addendum)
Follow-up with your OB/GYN.  As we discussed if symptoms do not improve CT of neck and CT maxillofacial could be appropriate to evaluate the areas of injury.  But due to the pregnancy is also appropriate to delay that evaluation.  The likelihood of's of significant injury from the vasovagal syncopal episode is small.

## 2018-08-06 NOTE — ED Triage Notes (Signed)
Pt presents for evaluation of R shoulder, arm and neck pain and possible syncopal event while vomiting today. Pt is [redacted] weeks pregnant. AxO x4.

## 2018-08-06 NOTE — ED Provider Notes (Signed)
[facility] EMERGENCY DEPARTMENT Provider Note   CSN: [id number] Arrival date & time: 08/06/18  1036     History   Chief Complaint Chief Complaint  Patient presents with  . Fall    HPI Shalandria Barb Shear is a 37 y.o. female.  Patient is [redacted] weeks pregnant.  Due date is March 29, 2019.  Gravida 1 para 0.  Patient in the early hours of the morning.  Got up because she was feeling nauseated.  She was vomiting sitting on the commode.  Vomiting into a bucket.  When she passed out.  Her husband heard her hit the ground.  He got there immediately stated that she was not completely out but then she became more alert.  She fell from a sitting position.  Her complaint is right shoulder arm neck pain and right jaw pain.  No low back pain no abdominal pain no difficulty breathing.  No other extremity can complaints.  Patient states the pain is worse currently and the most pain is in the right lateral neck and jaw area.  Also does have a bruise on her right upper arm.  No pain to the elbow or forearm or hand.  Patient had a recent OB/GYN visit.  Did have confirmation of intrauterine pregnancy.  Not having any vaginal bleeding.  Patient without any headache.  No further nausea or vomiting.  No double vision.  No change in her bite.  Teeth line up properly.  No difficulty opening her mouth.     Past Medical History:  Diagnosis Date  . 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 Migraines   . 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 Seasonal allergies   . Wears contact lenses     Patient Active Problem List   Diagnosis Date Noted  . Frequent headaches 07/21/2018  . Infertility, female 07/21/2018  . Environmental allergies 01/07/2016    Past Surgical History:  Procedure Laterality Date  . CHOLECYSTECTOMY  11/2003  . CHROMOPERTUBATION N/A 05/08/2018   Procedure:  CHROMOPERTUBATION;  Surgeon: Marcelle Overlie, MD;  [facility];  Service: Gynecology;  Laterality: N/A;  . 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 KNEE ARTHROSCOPY Left 2000  . KNEE ARTHROSCOPY WITH MEDIAL MENISECTOMY Right 10/10/2014   Procedure: RIGHT KNEE ARTHROSCOPY; RESECTION PLICA; CHONDROPLASTY, AND PARTIAL MEDIAL MENISCECTOMY;  Surgeon: Loreta Ave, MD;  Location: [facility];  Service: Orthopedics;  Laterality: Right;  . LAPAROSCOPY N/A 05/08/2018   Procedure: LAPAROSCOPY DIAGNOSTIC;  Surgeon: Marcelle Overlie, MD;  Location: [facility];  Service: Gynecology;  Laterality: N/A;  fulgeration on endometriosis  . MAXILLARY ANTROSTOMY Bilateral 09/07/2016   Procedure: MAXILLARY ANTROSTOMY WITH TISSUE REMOVAL;  Surgeon: Linus Salmons, MD;  Location: [facility];  Service: ENT;  Laterality: Bilateral;  . NASAL TURBINATE REDUCTION Bilateral 09/07/2016   Procedure: TURBINATE REDUCTION/SUBMUCOSAL RESECTION;  Surgeon: Linus Salmons, MD;  Location: [facility];  Service: ENT;  Laterality: Bilateral;  . SEPTOPLASTY N/A 09/07/2016   Procedure: SEPTOPLASTY;  Surgeon: Linus Salmons, MD;  Location: [facility];  Service: ENT;  Laterality: N/A;  . TONSILLECTOMY AND ADENOIDECTOMY    . WISDOM TOOTH EXTRACTION  OB History   None      Home Medications    Prior to Admission medications   Medication Sig Start Date End Date Taking? Authorizing Provider  betamethasone dipropionate (DIPROLENE) 0.05 % cream Apply topically 2 (two) times daily as needed. Patient taking differently: Apply 1 application topically 2 (two) times daily as needed (eczema).  02/17/18   Karie Schwalbe, MD  butalbital-acetaminophen-caffeine (FIORICET, ESGIC)  [phone] MG tablet Take 1 tablet by mouth 2 (two) times daily as needed for headache. 07/21/18   Lorre Munroe, NP  cetirizine (ZYRTEC) 10 MG tablet Take 1 tablet (10 mg total) by mouth daily. 07/21/18   Lorre Munroe, NP  levocetirizine (XYZAL) 5 MG tablet TAKE ONE TABLET BY MOUTH EACH EVENING 07/21/18   Lorre Munroe, NP  montelukast (SINGULAIR) 10 MG tablet Take 1 tablet (10 mg total) by mouth at bedtime. 07/21/18   Lorre Munroe, NP  Multiple Vitamin (MULTIVITAMIN WITH MINERALS) TABS tablet Take 1 tablet by mouth at bedtime.    [provider]  pyridOXINE (VITAMIN B-6) 50 MG tablet Take 1 tablet (50 mg total) by mouth every 8 (eight) hours as needed. Patient taking differently: Take 50 mg by mouth daily as needed (stomach pain).  07/03/17   Rockne Menghini, MD    Family History Family History  Problem Relation Age of Onset  . Arthritis Mother   . Ovarian cancer Mother   . Hyperlipidemia Mother   . Hypertension Mother   . Irritable bowel syndrome Mother   . Arthritis Father   . Hypertension Father   . Asthma Father   . Asthma Sister   . Arthritis Paternal Grandmother     Social History Social History   Tobacco Use  . Smoking status: Never Smoker  . Smokeless tobacco: Never Used  Substance Use Topics  . Alcohol use: Yes    Alcohol/week: 0.0 standard drinks    Comment: occasionally  . Drug use: No     Allergies   Iodine; Ivp dye [iodinated diagnostic agents]; Omeprazole; Penicillins; Shellfish allergy; and Soap   Review of Systems Review of Systems  Constitutional: Negative for fever.  HENT: Positive for facial swelling. Negative for congestion and trouble swallowing.   Eyes: Negative for visual disturbance.  Respiratory: Negative for shortness of breath.   Cardiovascular: Negative for chest pain.  Gastrointestinal: Positive for nausea and vomiting. Negative for abdominal pain.  Musculoskeletal: Positive for neck pain. Negative for back pain.    Neurological: Positive for syncope. Negative for facial asymmetry, speech difficulty, weakness, numbness and headaches.     Physical Exam Updated Vital Signs BP 138/83 (BP Location: Right Arm)   Pulse 74   Temp 97.9 F (36.6 C) (Oral)   Resp 18   LMP 06/22/2018 (Exact Date)   SpO2 100%   Physical Exam  Constitutional: She is oriented to person, place, and time. She appears well-developed and well-nourished. No distress.  HENT:  Head: Normocephalic.  Mouth/Throat: Oropharynx is clear and moist.  Swelling along the margin of the jaw and a little bit up around the TMJ area.  Mouth opens and closes well.  Teeth align properly.  No tenderness or swelling around the right orbit.  Eyes: Pupils are equal, round, and reactive to light. Conjunctivae and EOM are normal.  Neck: Normal range of motion. Neck supple.  No tenderness to palpation posteriorly.  Good range of motion.  Some mild tenderness to palpation right lateral.  Also some swelling to that area.  Cardiovascular: Normal rate, regular rhythm and normal heart sounds.  Pulmonary/Chest: Effort normal and breath sounds normal. No respiratory distress. She exhibits no tenderness.  Abdominal: Soft. Bowel sounds are normal. There is no tenderness.  Musculoskeletal: Normal range of motion.  Mild tenderness to palpation right shoulder collarbone area.  Good range of motion to the shoulder elbow wrist and fingers.  No deformity to the collarbone.  Large area of bruising to the triceps area.  Measuring probably about 7 cm.  No tenderness to palpation of the humerus.  No deformity of the humerus.  Neurological: She is alert and oriented to person, place, and time. No cranial nerve deficit or sensory deficit. She exhibits normal muscle tone. Coordination normal.  Skin: Skin is warm.  Nursing note and vitals reviewed.    ED Treatments / Results  Labs (all labs ordered are listed, but only abnormal results are displayed) Labs Reviewed - No  data to display  EKG None  Radiology No results found.  Procedures Procedures (including critical care time)  Medications Ordered in ED Medications - No data to display   Initial Impression / Assessment and Plan / ED Course  I have reviewed the triage vital signs and the nursing notes.  Pertinent labs & imaging results that were available during my care of the patient were reviewed by me and considered in my medical decision making (see chart for details).    Patient with a syncopal episode from the sitting position.  Most likely vasovagal since she was vomiting.  Is been fine since.  She fell striking the right side of her face neck and arm.  Clinically no evidence of any fractures to the humerus or collarbone.  Good range of motion of the neck no posterior tenderness.  No numbness or weakness to the right finger area.  Radial pulses 2+.  No deformity over the collarbone.  No abdominal tenderness.  Patient would prefer not to have x-rays or CT scans which is understandable since she is [redacted] weeks pregnant.  Based on her evaluation clinically it is unlikely to have significant injury.  Patient would prefer to follow-up with her doctors.  If symptoms do not show signs of improving then she will get further studies.  Initial concern was more for a jaw fracture or injury.  Not too concerned about cervical spine injury.  Based on her findings.  Based on Nexus criteria for C-spine injury.  Patient had no posterior midline cervical tenderness.  No evidence of intoxication.  Normal level of alertness.  No focal neurological deficits.  No significant painful distracting injuries.  Final Clinical Impressions(s) / ED Diagnoses   Final diagnoses:  Vasovagal syncope  Neck pain  Fall, initial encounter  Jaw pain    ED Discharge Orders    None       Vanetta MuldersZackowski, Atziri Zubiate, MD 08/06/18 [phone]

## 2018-08-17 LAB — OB RESULTS CONSOLE GBS: GBS: POSITIVE

## 2018-09-10 ENCOUNTER — Encounter (HOSPITAL_COMMUNITY): Payer: Self-pay

## 2018-09-10 ENCOUNTER — Inpatient Hospital Stay (HOSPITAL_COMMUNITY)
Admission: AD | Admit: 2018-09-10 | Discharge: 2018-09-10 | Disposition: A | Discharge status: Home / Self Care | Payer: PRIVATE HEALTH INSURANCE | Source: Ambulatory Visit | Attending: Obstetrics and Gynecology | Admitting: Obstetrics and Gynecology

## 2018-09-10 DIAGNOSIS — Z3A11 11 weeks gestation of pregnancy: Secondary | ICD-10-CM | POA: Diagnosis not present

## 2018-09-10 DIAGNOSIS — Z8669 Personal history of other diseases of the nervous system and sense organs: Secondary | ICD-10-CM | POA: Diagnosis not present

## 2018-09-10 DIAGNOSIS — G43909 Migraine, unspecified, not intractable, without status migrainosus: Secondary | ICD-10-CM | POA: Insufficient documentation

## 2018-09-10 DIAGNOSIS — O26891 Other specified pregnancy related conditions, first trimester: Secondary | ICD-10-CM | POA: Insufficient documentation

## 2018-09-10 DIAGNOSIS — G43009 Migraine without aura, not intractable, without status migrainosus: Secondary | ICD-10-CM

## 2018-09-10 DIAGNOSIS — O219 Vomiting of pregnancy, unspecified: Secondary | ICD-10-CM

## 2018-09-10 DIAGNOSIS — R110 Nausea: Secondary | ICD-10-CM | POA: Insufficient documentation

## 2018-09-10 DIAGNOSIS — Z8759 Personal history of other complications of pregnancy, childbirth and the puerperium: Secondary | ICD-10-CM

## 2018-09-10 LAB — URINALYSIS, ROUTINE W REFLEX MICROSCOPIC
BILIRUBIN URINE: NEGATIVE
GLUCOSE, UA: NEGATIVE mg/dL
HGB URINE DIPSTICK: NEGATIVE
KETONES UR: NEGATIVE mg/dL
Leukocytes, UA: NEGATIVE
Nitrite: NEGATIVE
PH: 8 (ref 5.0–8.0)
PROTEIN: NEGATIVE mg/dL
Specific Gravity, Urine: 1.011 (ref 1.005–1.030)

## 2018-09-10 LAB — POCT PREGNANCY, URINE: Preg Test, Ur: POSITIVE — AB

## 2018-09-10 MED ORDER — ONDANSETRON 8 MG PO TBDP
8.0000 mg | ORAL_TABLET | Freq: Once | ORAL | Status: AC
  Administered 2018-09-10: 8 mg via ORAL
  Filled 2018-09-10: qty 1

## 2018-09-10 MED ORDER — BUTALBITAL-APAP-CAFFEINE 50-325-40 MG PO TABS
2.0000 | ORAL_TABLET | Freq: Once | ORAL | Status: AC
  Administered 2018-09-10: 2 via ORAL
  Filled 2018-09-10: qty 2

## 2018-09-10 MED ORDER — ONDANSETRON 8 MG PO TBDP: 8.0000 mg | ORAL_TABLET | Freq: Three times a day (TID) | ORAL | 2 refills | Status: AC | PRN

## 2018-09-10 MED ORDER — PROMETHAZINE HCL 25 MG PO TABS
25.0000 mg | ORAL_TABLET | Freq: Once | ORAL | Status: AC
  Administered 2018-09-10: 25 mg via ORAL
  Filled 2018-09-10: qty 1

## 2018-09-10 NOTE — Discharge Instructions (Signed)
Recurrent Migraine Headache    Migraines are a type of headache, and they are usually stronger and more sudden than normal headaches (tension headaches). Migraines are characterized by an intense pulsing, throbbing pain that is usually only present on one side of the head. Sometimes, migraine headaches can cause nausea, vomiting, sensitivity to light and sound, and vision changes. Recurrent migraines keep coming back (recurring). A migraine can last from 4 hours up to 3 days.  What are the causes?  The exact cause of this condition is not known. However, a migraine may be caused when nerves in the brain become irritated and release chemicals that cause inflammation of blood vessels. This inflammation causes pain.  Certain things may also trigger migraines, such as:  · A disruption in your regular eating and sleeping schedule.  · Smoking.  · Stress.  · Menstruation.  · Certain foods and drinks, such as:  ? Aged cheese.  ? Chocolate.  ? Alcohol.  ? Caffeine.  ? Foods or drinks that contain nitrates, glutamate, aspartame, MSG, or tyramine.  · Lack of sleep.  · Hunger.  · Physical exertion.  · Fatigue.  · High altitude.  · Weather changes.  · Medicines, such as:  ? Nitroglycerin, which is used to treat chest pain.  ? Birth control pills.  ? Estrogen.  ? Some blood pressure medicines.  What are the signs or symptoms?  Symptoms of this condition vary for each person and may include:  · Pain that is usually only present on one side of the head. In some cases, the pain may be on both sides of the head or around the head or neck.  · Pulsating or throbbing pain.  · Severe pain that prevents daily activities.  · Pain that is aggravated by any physical activity.  · Nausea, vomiting, or both.  · Dizziness.  · Pain with exposure to bright lights, loud noises, or activity.  · General sensitivity to bright lights, loud noises, or smells.  Before you get a migraine, you may get warning signs that a migraine is coming (aura). An aura  may include:  · Seeing flashing lights.  · Seeing bright spots, halos, or zigzag lines.  · Having tunnel vision or blurred vision.  · Having numbness or a tingling feeling.  · Having trouble talking.  · Having muscle weakness.  · Smelling a certain odor.  How is this diagnosed?  This condition is often diagnosed based on:  · Your symptoms and medical history.  · A physical exam.  You may also have tests, including:  · A CT scan or MRI of your brain. These imaging tests cannot diagnose migraines, but they can help to rule out other causes of headaches.  · Blood tests.  How is this treated?  This condition is treated with:  · Medicines. These are used for:  ? Lessening pain and nausea.  ? Preventing recurrent migraines.  · Lifestyle changes, such as changes to your diet or sleeping patterns.  · Behavior therapy, such as relaxation training or biofeedback. Biofeedback is a treatment that involves teaching you to relax and use your brain to lower your heart rate and control your breathing.  Follow these instructions at home:  Medicines  · Take over-the-counter and prescription medicines only as told by your health care provider.  · Do not drive or use heavy machinery while taking prescription pain medicine.  Lifestyle  · Do not use any products that contain nicotine or tobacco, such as   cigarettes and e-cigarettes. If you need help quitting, ask your health care provider.  · Limit alcohol intake to no more than 1 drink a day for nonpregnant women and 2 drinks a day for men. One drink equals 12 oz of beer, 5 oz of wine, or 1½ oz of hard liquor.  · Get 7-9 hours of sleep each night, or the amount of sleep recommended by your health care provider.  · Limit your stress. Talk with your health care provider if you need help with stress management.  · Maintain a healthy weight. If you need help losing weight, ask your health care provider.  · Exercise regularly. Aim for 150 minutes of moderate-intensity exercise (walking,  biking, yoga) or 75 minutes of vigorous exercise (running, circuit training, swimming) each week.  General instructions    · Keep a journal to find out what triggers your migraine headaches so you can avoid these triggers. For example, write down:  ? What you eat and drink.  ? How much sleep you get.  ? Any change to your diet or medicines.  · Lie down in a dark, quiet room when you have a migraine.  · Try placing a cool towel over your head when you have a migraine.  · Keep lights dim, if bright lights bother you and make your migraines worse.  · Keep all follow-up visits as told by your health care provider. This is important.  Contact a health care provider if:  · Your pain does not improve, even with medicine.  · Your migraines continue to return, even with medicine.  · You have a fever.  · You have weight loss.  Get help right away if:  · Your migraine becomes severe and medicine does not help.  · You have a stiff neck.  · You have a loss of vision.  · You have muscle weakness or loss of muscle control.  · You start losing your balance or have trouble walking.  · You feel faint or you pass out.  · You develop new, severe symptoms.  · You start having abrupt severe headaches that last for a second or less, like a thunderclap.  Summary  · Migraine headaches are usually stronger and more sudden than normal headaches (tension headaches). Migraines are characterized by an intense pulsing, throbbing pain that is usually only present on one side of the head.  · The exact cause of this condition is not known. However, a migraine may be caused when nerves in the brain become irritated and release chemicals that cause inflammation of blood vessels.  · Certain things may trigger migraines, such as changes to diet or sleeping patterns, smoking, certain foods, alcohol, stress, and certain medicines.  · Sometimes, migraine headaches can cause nausea, vomiting, sensitivity to light and sound, and vision changes.  · Migraines  are often diagnosed based on your symptoms, medical history, and a physical exam.  This information is not intended to replace advice given to you by your health care provider. Make sure you discuss any questions you have with your health care provider.  Document Released: 05/25/2001 Document Revised: 06/11/2016 Document Reviewed: 06/11/2016  Elsevier Interactive Patient Education © 2019 [employer] Inc[employer]

## 2018-09-10 NOTE — MAU Provider Note (Signed)
Chief Complaint: Headache and Nausea   First Provider Initiated Contact with Patient 09/10/18 1840     SUBJECTIVE HPI: Mikayla Briggs is a 37 y.o. G1P0 at [redacted]w[redacted]d who presents to [facility] reporting Right-sided HA similar to chronic migraines and nausea. Has seen PCP and Dr. Vincente Poli for Migraines in the past. States Dr. Vincente Poli Rx'd Fioricet and it helps sometimes. Has Rx, but didn't take any for this episode.   Location: Right-temple Quality: Pounding Severity: 8-9/10 on pain scale Duration: ~12 hours Course: Unchanged Timing: Constant Modifying factors: No improvement w/ Tylenol. Last dose this morning. Hasn't tried Fioricet.  Associated signs and symptoms: Pos for nausea, photophobia. Neg for fever, chills, neck pain, vision changes, difficulties w speech or gait, mental status change, weakness.  Past Medical History:  Diagnosis Date  . 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 Migraines   . 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 Seasonal allergies   . Wears contact lenses    OB History  Gravida Para Term Preterm AB Living  1            SAB TAB Ectopic Multiple Live Births               # Outcome Date GA Lbr Len/2nd Weight Sex Delivery Anes PTL Lv  1 Current            Past Surgical History:  Procedure Laterality Date  . CHOLECYSTECTOMY  11/2003  . CHROMOPERTUBATION N/A 05/08/2018   Procedure: CHROMOPERTUBATION;  Surgeon: Marcelle Overlie, MD;  Location: [facility];  Service: Gynecology;  Laterality: N/A;  . 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 KNEE ARTHROSCOPY Left 2000  . KNEE ARTHROSCOPY WITH MEDIAL MENISECTOMY Right 10/10/2014   Procedure: RIGHT KNEE ARTHROSCOPY;  RESECTION PLICA; CHONDROPLASTY, AND PARTIAL MEDIAL MENISCECTOMY;  Surgeon: Loreta Ave, MD;  Location: [facility];  Service: Orthopedics;  Laterality: Right;  . LAPAROSCOPY N/A 05/08/2018   Procedure: LAPAROSCOPY DIAGNOSTIC;  Surgeon: Marcelle Overlie, MD;  Location: [facility];  Service: Gynecology;  Laterality: N/A;  fulgeration on endometriosis  . MAXILLARY ANTROSTOMY Bilateral 09/07/2016   Procedure: MAXILLARY ANTROSTOMY WITH TISSUE REMOVAL;  Surgeon: Linus Salmons, MD;  Location: [facility];  Service: ENT;  Laterality: Bilateral;  . NASAL TURBINATE REDUCTION Bilateral 09/07/2016   Procedure: TURBINATE REDUCTION/SUBMUCOSAL RESECTION;  Surgeon: Linus Salmons, MD;  Location: [facility];  Service: ENT;  Laterality: Bilateral;  . SEPTOPLASTY N/A 09/07/2016   Procedure: SEPTOPLASTY;  Surgeon: Linus Salmons, MD;  Location: [facility];  Service: ENT;  Laterality: N/A;  . TONSILLECTOMY AND ADENOIDECTOMY    . WISDOM TOOTH EXTRACTION     Social History   Socioeconomic History  . Marital status: Married    Spouse name: Derryl Harbor  . Number of children: 0  . Years of education: Not on file  . Highest education level: Not on file  Occupational History  . Occupation: CNA/ CPT  Social Needs  . Financial resource strain: Not on file  . Food insecurity:    Worry: Not on file    Inability: Not on file  . Transportation needs:    Medical: Not on file  Non-medical: Not on file  Tobacco Use  . Smoking status: Never Smoker  . Smokeless tobacco: Never Used  Substance and Sexual Activity  . Alcohol use: Yes    Alcohol/week: 0.0 standard drinks    Comment: occasionally  . Drug use: No  . Sexual activity: Yes    Birth control/protection: None  Lifestyle  . Physical activity:    Days per week: Not on file    Minutes per session: Not on file  . Stress: Not on file  Relationships  . Social connections:    Talks on phone: Not on  file    Gets together: Not on file    Attends religious service: Not on file    Active member of club or organization: Not on file    Attends meetings of clubs or organizations: Not on file    Relationship status: Not on file  . Intimate partner violence:    Fear of current or ex partner: Not on file    Emotionally abused: Not on file    Physically abused: Not on file    Forced sexual activity: Not on file  Other Topics Concern  . Not on file  Social History Narrative  . Not on file   Family History  Problem Relation Age of Onset  . Arthritis Mother   . Ovarian cancer Mother   . Hyperlipidemia Mother   . Hypertension Mother   . Irritable bowel syndrome Mother   . Arthritis Father   . Hypertension Father   . Asthma Father   . Asthma Sister   . Arthritis Paternal Grandmother    No current facility-administered medications on file prior to encounter.    Current Outpatient Medications on File Prior to Encounter  Medication Sig Dispense Refill  . betamethasone dipropionate (DIPROLENE) 0.05 % cream Apply topically 2 (two) times daily as needed. (Patient taking differently: Apply 1 application topically 2 (two) times daily as needed (eczema). ) 30 g 0  . butalbital-acetaminophen-caffeine (FIORICET, ESGIC) 50-325-40 MG tablet Take 1 tablet by mouth 2 (two) times daily as needed for headache. 14 tablet 5  . cetirizine (ZYRTEC) 10 MG tablet Take 1 tablet (10 mg total) by mouth daily. 90 tablet 3  . levocetirizine (XYZAL) 5 MG tablet TAKE ONE TABLET BY MOUTH EACH EVENING 90 tablet 3  . montelukast (SINGULAIR) 10 MG tablet Take 1 tablet (10 mg total) by mouth at bedtime. 90 tablet 3  . Multiple Vitamin (MULTIVITAMIN WITH MINERALS) TABS tablet Take 1 tablet by mouth at bedtime.    . pyridOXINE (VITAMIN B-6) 50 MG tablet Take 1 tablet (50 mg total) by mouth every 8 (eight) hours as needed. (Patient taking differently: Take 50 mg by mouth daily as needed (stomach pain). ) 20 tablet 0    Allergies  Allergen Reactions  . Iodine Itching and Swelling  . 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   . Omeprazole Anaphylaxis and Shortness Of Breath  . Penicillins Anaphylaxis and Shortness Of Breath    Has patient had a PCN reaction causing immediate rash, facial/tongue/throat swelling, SOB or lightheadedness with hypotension: Yes Has patient had a PCN reaction causing severe rash involving mucus membranes or skin necrosis: No Has patient had a PCN reaction that required hospitalization: Unknown Has patient had a PCN reaction occurring within the last 10 years: No If all of the above answers  are "NO", then may proceed with Cephalosporin use.   . Shellfish Allergy Anaphylaxis and Shortness Of Breath  . Soap Itching    ANYTHING WITH FRAGRANCE    I have reviewed patient's Past Medical Hx, Surgical Hx, Family Hx, Social Hx, medications and allergies.   Review of Systems  Constitutional: Negative for chills and fever.  HENT: Negative for sinus pressure and sinus pain.   Eyes: Positive for photophobia. Negative for visual disturbance.  Gastrointestinal: Positive for nausea. Negative for abdominal pain, diarrhea and vomiting.  Genitourinary: Positive for menstrual problem. Negative for vaginal bleeding and vaginal discharge.  Musculoskeletal: Negative for neck pain and neck stiffness.  Neurological: Positive for headaches. Negative for dizziness, speech difficulty and weakness.    OBJECTIVE Patient Vitals for the past 24 hrs:  BP Temp Temp src Pulse Resp Height Weight  09/10/18 2030 129/82 - - 92 - - -  09/10/18 1731 133/78 98 F (36.7 C) Oral (!) 105 20 5' 5.5" (1.664 m) 120.8 kg   Constitutional: Well-developed, well-nourished female in mild distress. Mildly-ill appearing.  Cardiovascular: normal rate Respiratory: normal rate and effort.  GI: Abd soft,  non-tender, gravid appropriate for gestational age.  Neurologic: Alert and oriented x 4. Normal speech and gait. GU: Deferred  LAB RESULTS Results for orders placed or performed during the hospital encounter of 09/10/18 (from the past 24 hour(s))  Urinalysis, Routine w reflex microscopic     Status: None   Collection Time: 09/10/18  5:39 PM  Result Value Ref Range   Color, Urine YELLOW YELLOW   APPearance CLEAR CLEAR   Specific Gravity, Urine 1.011 1.005 - 1.030   pH 8.0 5.0 - 8.0   Glucose, UA NEGATIVE NEGATIVE mg/dL   Hgb urine dipstick NEGATIVE NEGATIVE   Bilirubin Urine NEGATIVE NEGATIVE   Ketones, ur NEGATIVE NEGATIVE mg/dL   Protein, ur NEGATIVE NEGATIVE mg/dL   Nitrite NEGATIVE NEGATIVE   Leukocytes, UA NEGATIVE NEGATIVE  Pregnancy, urine POC     Status: Abnormal   Collection Time: 09/10/18  5:44 PM  Result Value Ref Range   Preg Test, Ur POSITIVE (A) NEGATIVE    IMAGING Pos cardiac activity per informal BS US.  MAU COURSE Orders Placed This Encounter  Procedures  . Urinalysis, Routine w reflex microscopic  . Pregnancy, urine POC  . Discharge patient   Meds ordered this encounter  Medications  . butalbital-acetaminophen-caffeine (FIORICET, ESGIC) 50-325-40 MG per tablet 2 tablet  . promethazine (PHENERGAN) tablet 25 mg  . ondansetron (ZOFRAN-ODT) disintegrating tablet 8 mg  . ondansetron (ZOFRAN-ODT) 8 MG disintegrating tablet    Sig: Take 1 tablet (8 mg total) by mouth every 8 (eight) hours as needed for nausea or vomiting.    Dispense:  20 tablet    Refill:  2    Order Specific Question:   Supervising Provider    Answer:   Conan BowensDAVIS, KELLY M [[id number]]   HA decreased to 3/10 after Fioricet. Nausea had resolved, but vomiting once when she was up to go to the bathroom. Zofran ODT given per pt request. States she feels much better. Requesting D/C and refill of Zofran. States she has Fioricet at home w/ refills.   MDM - Chronic migraine HA improved significantly w/  Fioricet. No thunderclap HA or evidence of emergent intracranial process. Recommend F/U w/ PCP if Migraines are not responding to Fioricet. Also recomd PO hydration, caffeine and resting in a dark room when Migraines occur. May use Fioricet during pregnancy.  - N/V either  migraine or pregnancy-related. Pt states Zofran ODT works well. Requests refill-->given.  ASSESSMENT 1. Migraine without aura and without status migrainosus, not intractable   2. H/O migraine during pregnancy   3. Nausea/vomiting in pregnancy     PLAN Discharge home in stable condition. HA precautions Follow-up Information    Marcelle Overlie, MD Follow up in 1 week(s).   Specialty:  Obstetrics and Gynecology Why:  as scheduled or sooner as needed if symptoms worsen Contact information: [address] [place] [address] [phone]        [facility] Follow up.   Specialty:  Obstetrics and Gynecology Why:  as needed in pregnancy emergencies Contact information: [address] [id number] mc [place] [address] [phone]         Allergies as of 09/10/2018      Reactions   Iodine Itching, Swelling   Ivp Dye [iodinated Diagnostic Agents] Anaphylaxis   Dripped IVP dye on her arm after taking an IV out of a patient and had angioedema within 15 mins.    Omeprazole Anaphylaxis, Shortness Of Breath   Penicillins Anaphylaxis, Shortness Of Breath   Has patient had a PCN reaction causing immediate rash, facial/tongue/throat swelling, SOB or lightheadedness with hypotension: Yes Has patient had a PCN reaction causing severe rash involving mucus membranes or skin necrosis: No Has patient had a PCN reaction that required hospitalization: Unknown Has patient had a PCN reaction occurring within the last 10 years: No If all of the above answers are "NO", then may proceed with Cephalosporin use.   Shellfish Allergy Anaphylaxis, Shortness Of Breath   Soap  Itching   ANYTHING WITH FRAGRANCE      Medication List    TAKE these medications   betamethasone dipropionate 0.05 % cream Commonly known as:  DIPROLENE Apply topically 2 (two) times daily as needed. What changed:    how much to take  reasons to take this   butalbital-acetaminophen-caffeine 50-325-40 MG tablet Commonly known as:  FIORICET, ESGIC Take 1 tablet by mouth 2 (two) times daily as needed for headache.   cetirizine 10 MG tablet Commonly known as:  ZYRTEC Take 1 tablet (10 mg total) by mouth daily.   levocetirizine 5 MG tablet Commonly known as:  XYZAL TAKE ONE TABLET BY MOUTH EACH EVENING   montelukast 10 MG tablet Commonly known as:  SINGULAIR Take 1 tablet (10 mg total) by mouth at bedtime.   multivitamin with minerals Tabs tablet Take 1 tablet by mouth at bedtime.   ondansetron 8 MG disintegrating tablet Commonly known as:  ZOFRAN-ODT Take 1 tablet (8 mg total) by mouth every 8 (eight) hours as needed for nausea or vomiting.   pyridOXINE 50 MG tablet Commonly known as:  VITAMIN B-6 Take 1 tablet (50 mg total) by mouth every 8 (eight) hours as needed. What changed:    when to take this  reasons to take this        Katrinka Blazing [place], [place] 09/10/2018  8:36 PM

## 2018-09-10 NOTE — MAU Note (Signed)
Woke up this AM with a migraine on the right side, has tried tylenol with no relief. States the pain is making her nauseated  No abdominal pain, no bleeding, no discharge  LMP 06/22/18

## 2018-10-16 ENCOUNTER — Inpatient Hospital Stay (HOSPITAL_COMMUNITY)
Admission: AD | Admit: 2018-10-16 | Discharge: 2018-10-16 | Disposition: A | Discharge status: Home / Self Care | Payer: PRIVATE HEALTH INSURANCE | Attending: Obstetrics and Gynecology | Admitting: Obstetrics and Gynecology

## 2018-10-16 ENCOUNTER — Other Ambulatory Visit: Payer: Self-pay

## 2018-10-16 ENCOUNTER — Encounter (HOSPITAL_COMMUNITY): Payer: Self-pay | Admitting: *Deleted

## 2018-10-16 DIAGNOSIS — Z880 Allergy status to penicillin: Secondary | ICD-10-CM | POA: Diagnosis not present

## 2018-10-16 DIAGNOSIS — Z3A16 16 weeks gestation of pregnancy: Secondary | ICD-10-CM | POA: Insufficient documentation

## 2018-10-16 DIAGNOSIS — O26892 Other specified pregnancy related conditions, second trimester: Secondary | ICD-10-CM | POA: Insufficient documentation

## 2018-10-16 DIAGNOSIS — Z659 Problem related to unspecified psychosocial circumstances: Secondary | ICD-10-CM

## 2018-10-16 DIAGNOSIS — R030 Elevated blood-pressure reading, without diagnosis of hypertension: Secondary | ICD-10-CM | POA: Insufficient documentation

## 2018-10-16 DIAGNOSIS — O09512 Supervision of elderly primigravida, second trimester: Secondary | ICD-10-CM | POA: Insufficient documentation

## 2018-10-16 DIAGNOSIS — O162 Unspecified maternal hypertension, second trimester: Secondary | ICD-10-CM

## 2018-10-16 LAB — URINALYSIS, ROUTINE W REFLEX MICROSCOPIC
BILIRUBIN URINE: NEGATIVE
Glucose, UA: NEGATIVE mg/dL
Hgb urine dipstick: NEGATIVE
Ketones, ur: NEGATIVE mg/dL
Leukocytes, UA: NEGATIVE
NITRITE: NEGATIVE
Protein, ur: NEGATIVE mg/dL
Specific Gravity, Urine: 1.01 (ref 1.005–1.030)
pH: 6.5 (ref 5.0–8.0)

## 2018-10-16 NOTE — Discharge Instructions (Signed)
Hypertension During Pregnancy ° °Hypertension is also called high blood pressure. High blood pressure means that the force of your blood moving in your body is too strong. When you are pregnant, this condition should be watched carefully. It can cause problems for you and your baby. °Follow these instructions at home: °Eating and drinking ° °· Drink enough fluid to keep your pee (urine) pale yellow. °· Avoid caffeine. °Lifestyle °· Do not use any products that contain nicotine or tobacco, such as cigarettes and e-cigarettes. If you need help quitting, ask your doctor. °· Do not use alcohol or drugs. °· Avoid stress. °· Rest and get plenty of sleep. °General instructions °· Take over-the-counter and prescription medicines only as told by your doctor. °· While lying down, lie on your left side. This keeps pressure off your major blood vessels. °· While sitting or lying down, raise (elevate) your feet. Try putting some pillows under your lower legs. °· Exercise regularly. Ask your doctor what kinds of exercise are best for you. °· Keep all prenatal and follow-up visits as told by your doctor. This is important. °Contact a doctor if: °· You have symptoms that your doctor told you to watch for, such as: °? Throwing up (vomiting). °? Feeling sick to your stomach (nausea). °? Headache. °Get help right away if you have: °· Very bad belly pain that does not get better with treatment. °· A very bad headache that does not get better. °· Throwing up that does not get better with treatment. °· Sudden, fast weight gain. °· Sudden swelling in your hands, ankles, or face. °· Bleeding from your vagina. °· Blood in your pee. °· Fewer movements from your baby than usual. °· Blurry vision. °· Double vision. °· Muscle twitching. °· Sudden muscle tightening (spasms). °· Trouble breathing. °· Blue fingernails or lips. °Summary °· Hypertension is also called high blood pressure. High blood pressure means that the force of your blood moving  in your body is too strong. °· When you are pregnant, this condition should be watched carefully. It can cause problems for you and your baby. °· Get help right away if you have symptoms that your doctor told you to watch for. °This information is not intended to replace advice given to you by your health care provider. Make sure you discuss any questions you have with your health care provider. °Document Released: 10/02/2010 Document Revised: 08/16/2017 Document Reviewed: 05/11/2016 °Elsevier Interactive Patient Education © 2019 [employer] Inc. ° °

## 2018-10-16 NOTE — MAU Note (Signed)
BP has skyrocketed since lunch while at work.160's/90's Pulse up to 130.  Is not on any BP meds, it is always good.  Several co-workers have the flu.  She was put on Tamiflu as a precaution, she has no symptoms- just exposed.

## 2018-10-16 NOTE — MAU Provider Note (Addendum)
History     CSN: [id number]  Arrival date and time: 10/16/18 1712   First Provider Initiated Contact with Patient 10/16/18 1747      Chief Complaint  Patient presents with  . Hypertension  . Tachycardia   HPI Mikayla Briggs is a 38 y.o. G1P0 at 6519w4d who presents to MAU for evaluation of new onset elevated blood pressures today. She endorses "feeling off" since last night when she started prophylactic Tamiflu. She continued to feel strange throughout her work day today. 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 She denies history of abnormal blood pressure.  Patient is very tearful in MAU. She is the sole breadwinner in her family due to her husband's employment. He has newly-diagnosed stress-related seizures and is unable to work. Patient also endorses significant stress related to "everyone in my family hates me and things got worse this morning".  She denies abnormal vaginal discharge, vaginal bleeding, lightheadedness, SOB, syncope, weakness or fever.  OB History    Gravida  1   Para      Term      Preterm      AB      Living        SAB      TAB      Ectopic      Multiple      Live Births              Past Medical History:  Diagnosis Date  . 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 Migraines   . 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 Seasonal allergies   . Wears contact lenses     Past Surgical History:  Procedure Laterality Date  . CHOLECYSTECTOMY  11/2003  . CHROMOPERTUBATION N/A 05/08/2018   Procedure: CHROMOPERTUBATION;  Surgeon: Marcelle OverlieGrewal, Michelle, MD;  [facility];  Service: Gynecology;  Laterality: N/A;  . 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 KNEE ARTHROSCOPY Left 2000  . KNEE ARTHROSCOPY WITH MEDIAL MENISECTOMY Right 10/10/2014   Procedure: RIGHT KNEE ARTHROSCOPY; RESECTION PLICA; CHONDROPLASTY, AND PARTIAL MEDIAL MENISCECTOMY;  Surgeon: Loreta Aveaniel F Murphy, MD;  Location: [facility];  Service: Orthopedics;  Laterality: Right;  . LAPAROSCOPY N/A 05/08/2018   Procedure: LAPAROSCOPY DIAGNOSTIC;  Surgeon: Marcelle OverlieGrewal, Michelle, MD;  [facility] [place];  Service: Gynecology;  Laterality: N/A;  fulgeration on endometriosis  . MAXILLARY ANTROSTOMY Bilateral 09/07/2016   Procedure: MAXILLARY ANTROSTOMY WITH TISSUE REMOVAL;  Surgeon: Linus Salmonshapman McQueen, MD;  [facility];  Service: ENT;  Laterality: Bilateral;  . NASAL TURBINATE REDUCTION Bilateral 09/07/2016   Procedure: TURBINATE REDUCTION/SUBMUCOSAL RESECTION;  Surgeon: Linus Salmonshapman McQueen, MD;  [facility];  Service: ENT;  Laterality: Bilateral;  . SEPTOPLASTY N/A 09/07/2016   Procedure: SEPTOPLASTY;  Surgeon: Linus Salmonshapman McQueen, MD;  [facility];  Service: ENT;  Laterality: N/A;  . TONSILLECTOMY AND ADENOIDECTOMY    . WISDOM TOOTH EXTRACTION      Family History  Problem Relation Age of Onset  . Arthritis Mother   . Ovarian cancer Mother   . Hyperlipidemia Mother   .  Hypertension Mother   . Irritable bowel syndrome Mother   . Arthritis Father   . Hypertension Father   . Asthma Father   . Asthma Sister   . Arthritis Paternal Grandmother     Social History   Tobacco Use  . Smoking status: Never Smoker  . Smokeless tobacco: Never Used  Substance Use Topics  . Alcohol use: Yes    Alcohol/week: 0.0 standard drinks    Comment: occasionally  . Drug use: No    Allergies:  Allergies  Allergen Reactions  . Iodine Itching and Swelling  . Ivp Dye [Iodinated Diagnostic Agents] Anaphylaxis    Dripped IVP dye on her arm  after taking an IV out of a patient and had angioedema within 15 mins.   . Omeprazole Anaphylaxis and Shortness Of Breath  . Penicillins Anaphylaxis and Shortness Of Breath    Has patient had a PCN reaction causing immediate rash, facial/tongue/throat swelling, SOB or lightheadedness with hypotension: Yes Has patient had a PCN reaction causing severe rash involving mucus membranes or skin necrosis: No Has patient had a PCN reaction that required hospitalization: Unknown Has patient had a PCN reaction occurring within the last 10 years: No If all of the above answers are "NO", then may proceed with Cephalosporin use.   . Shellfish Allergy Anaphylaxis and Shortness Of Breath  . Soap Itching    ANYTHING WITH FRAGRANCE    Medications Prior to Admission  Medication Sig Dispense Refill Last Dose  . betamethasone dipropionate (DIPROLENE) 0.05 % cream Apply topically 2 (two) times daily as needed. (Patient taking differently: Apply 1 application topically 2 (two) times daily as needed (eczema). ) 30 g 0 Taking  . butalbital-acetaminophen-caffeine (FIORICET, ESGIC) 50-325-40 MG tablet Take 1 tablet by mouth 2 (two) times daily as needed for headache. 14 tablet 5   . cetirizine (ZYRTEC) 10 MG tablet Take 1 tablet (10 mg total) by mouth daily. 90 tablet 3   . levocetirizine (XYZAL) 5 MG tablet TAKE ONE TABLET BY MOUTH EACH EVENING 90 tablet 3   . montelukast (SINGULAIR) 10 MG tablet Take 1 tablet (10 mg total) by mouth at bedtime. 90 tablet 3   . Multiple Vitamin (MULTIVITAMIN WITH MINERALS) TABS tablet Take 1 tablet by mouth at bedtime.   Taking  . pyridOXINE (VITAMIN B-6) 50 MG tablet Take 1 tablet (50 mg total) by mouth every 8 (eight) hours as needed. (Patient taking differently: Take 50 mg by mouth daily as needed (stomach pain). ) 20 tablet 0 Taking    Review of Systems  Constitutional: Negative for chills, fatigue and fever.  Respiratory: Negative for shortness of breath.   Cardiovascular:  Negative for chest pain, palpitations and leg swelling.  Gastrointestinal: Negative for abdominal pain.  Genitourinary: Negative for difficulty urinating, flank pain, vaginal bleeding, vaginal discharge and vaginal pain.  Musculoskeletal: Negative for back pain.  Neurological: Negative for dizziness, seizures, weakness, light-headedness and headaches.  All other systems reviewed and are negative.  Physical Exam   Blood pressure (!) 141/92, pulse (!) 127, temperature 98.7 F (37.1 C), temperature source Oral, resp. rate 20, weight 125.2 kg, last menstrual period 06/22/2018, SpO2 97 %.  Physical Exam  Nursing note and vitals reviewed. Constitutional: She is oriented to person, place, and time. She appears well-developed and well-nourished.  Cardiovascular: Normal rate.  Respiratory: Effort normal.  GI: Soft. She exhibits no distension. There is no abdominal tenderness. There is no rebound, no guarding and no CVA tenderness.  Neurological: She is  alert and oriented to person, place, and time.  Skin: Skin is warm and dry.  Psychiatric: She has a normal mood and affect. Her behavior is normal. Judgment and thought content normal.    MAU Course/MDM   --Greater than 15 minutes spent at bedside discussing chronic hypertension in pregnancy vs gestational hypertension Patient Vitals for the past 24 hrs:  BP Temp Temp src Pulse Resp SpO2 Weight  10/16/18 1850 131/77 - - (!) 105 16 - -  10/16/18 1831 131/74 - - (!) 114 - - -  10/16/18 1816 116/78 - - (!) 114 - - -  10/16/18 1801 127/76 - - (!) 121 - - -  10/16/18 1744 138/86 - - - - - -  10/16/18 1725 (!) 141/92 98.7 F (37.1 C) Oral (!) 127 20 97 % 125.2 kg  10/16/18 1723 - - - - - 98 % -    Results for orders placed or performed during the hospital encounter of 10/16/18 (from the past 24 hour(s))  Urinalysis, Routine w reflex microscopic     Status: Abnormal   Collection Time: 10/16/18  5:34 PM  Result Value Ref Range   Color, Urine  YELLOW YELLOW   APPearance HAZY (A) CLEAR   Specific Gravity, Urine 1.010 1.005 - 1.030   pH 6.5 5.0 - 8.0   Glucose, UA NEGATIVE NEGATIVE mg/dL   Hgb urine dipstick NEGATIVE NEGATIVE   Bilirubin Urine NEGATIVE NEGATIVE   Ketones, ur NEGATIVE NEGATIVE mg/dL   Protein, ur NEGATIVE NEGATIVE mg/dL   Nitrite NEGATIVE NEGATIVE   Leukocytes, UA NEGATIVE NEGATIVE    Assessment and Plan  --38 y.o. G1P0 at [redacted]w[redacted]d  --FHT 158 by Doppler --Elevated blood pressure at work, slightly elevated x 1 in MAU, then normotensive --Significantly upset by social stress --Discharge home in stable condition  F/U: BP check Tuesday or Thursday this week at [facility] (per patient's work schedule). Voicemail left for after hours, note routed to clinic  Calvert Cantor, CNM 10/16/2018, 7:53 PM

## 2018-10-17 ENCOUNTER — Encounter (HOSPITAL_COMMUNITY): Payer: Self-pay

## 2018-10-17 ENCOUNTER — Inpatient Hospital Stay (HOSPITAL_COMMUNITY)
Admission: AD | Admit: 2018-10-17 | Discharge: 2018-10-17 | Disposition: A | Discharge status: Home / Self Care | Payer: PRIVATE HEALTH INSURANCE | Attending: Obstetrics and Gynecology | Admitting: Obstetrics and Gynecology

## 2018-10-17 ENCOUNTER — Other Ambulatory Visit: Payer: Self-pay

## 2018-10-17 DIAGNOSIS — O219 Vomiting of pregnancy, unspecified: Secondary | ICD-10-CM | POA: Diagnosis not present

## 2018-10-17 DIAGNOSIS — Z880 Allergy status to penicillin: Secondary | ICD-10-CM | POA: Insufficient documentation

## 2018-10-17 DIAGNOSIS — O09512 Supervision of elderly primigravida, second trimester: Secondary | ICD-10-CM | POA: Diagnosis not present

## 2018-10-17 DIAGNOSIS — O210 Mild hyperemesis gravidarum: Secondary | ICD-10-CM | POA: Insufficient documentation

## 2018-10-17 DIAGNOSIS — Z3A16 16 weeks gestation of pregnancy: Secondary | ICD-10-CM

## 2018-10-17 LAB — URINALYSIS, ROUTINE W REFLEX MICROSCOPIC
Bilirubin Urine: NEGATIVE
Glucose, UA: NEGATIVE mg/dL
Hgb urine dipstick: NEGATIVE
Ketones, ur: 15 mg/dL — AB
Leukocytes, UA: NEGATIVE
Nitrite: NEGATIVE
PROTEIN: NEGATIVE mg/dL
Specific Gravity, Urine: 1.015 (ref 1.005–1.030)
pH: 7.5 (ref 5.0–8.0)

## 2018-10-17 MED ORDER — ONDANSETRON 8 MG PO TBDP
8.0000 mg | ORAL_TABLET | Freq: Once | ORAL | Status: AC
  Administered 2018-10-17: 8 mg via ORAL
  Filled 2018-10-17: qty 1

## 2018-10-17 NOTE — MAU Note (Signed)
Pt states that she "feels like crap".   Pt saw Dr. Vincente Poli today and she started the pt on zoloft.   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

## 2018-10-17 NOTE — MAU Provider Note (Signed)
Chief Complaint: Emesis   First Provider Initiated Contact with Patient 10/17/18 2042     SUBJECTIVE HPI: Mikayla Briggs is a 38 y.o. G1P0 at [redacted]w[redacted]d who presents to Maternity Admissions reporting n/v. Started taking tamiflu for PEP on Sunday. States she has felt bad since then with n/v. Has vomited twice today. Tried to take zofran earlier today but immediately vomited. Nausea has decreased since then & now rates her nausea 2/10. Was able to tolerate her night time meds after vomiting.  Denies abdominal pain, VB, diarrhea, or constipation. Last BM was today.    Past Medical History:  Diagnosis Date  . 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 Migraines   . 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 Seasonal allergies   . Wears contact lenses    OB History  Gravida Para Term Preterm AB Living  1            SAB TAB Ectopic Multiple Live Births               # Outcome Date GA Lbr Len/2nd Weight Sex Delivery Anes PTL Lv  1 Current            Past Surgical History:  Procedure Laterality Date  . CHOLECYSTECTOMY  11/2003  . CHROMOPERTUBATION N/A 05/08/2018   Procedure: CHROMOPERTUBATION;  Surgeon: Marcelle Overlie, MD;  Location[facility];  Service: Gynecology;  Laterality: N/A;  . 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 KNEE ARTHROSCOPY Left 2000  . KNEE ARTHROSCOPY WITH MEDIAL MENISECTOMY Right 10/10/2014   Procedure: RIGHT KNEE ARTHROSCOPY; RESECTION PLICA; CHONDROPLASTY, AND PARTIAL MEDIAL MENISCECTOMY;  Surgeon: Loreta Ave, MD;  Location: [facility];  Service: Orthopedics;  Laterality: Right;  . LAPAROSCOPY N/A 05/08/2018   Procedure: LAPAROSCOPY DIAGNOSTIC;  Surgeon:  Marcelle Overlie, MD;  Location: [facility];  Service: Gynecology;  Laterality: N/A;  fulgeration on endometriosis  . MAXILLARY ANTROSTOMY Bilateral 09/07/2016   Procedure: MAXILLARY ANTROSTOMY WITH TISSUE REMOVAL;  Surgeon: Linus Salmons, MD;  [facility];  Service: ENT;  Laterality: Bilateral;  . NASAL TURBINATE REDUCTION Bilateral 09/07/2016   Procedure: TURBINATE REDUCTION/SUBMUCOSAL RESECTION;  Surgeon: Linus Salmons, MD;  Location: [facility];  Service: ENT;  Laterality: Bilateral;  . SEPTOPLASTY N/A 09/07/2016   Procedure: SEPTOPLASTY;  Surgeon: Linus Salmons, MD;  [facility];  Service: ENT;  Laterality: N/A;  . TONSILLECTOMY AND ADENOIDECTOMY    . WISDOM TOOTH EXTRACTION     Social History   Socioeconomic History  . Marital status: Married    Spouse name: Derryl Harbor  . Number of children: 0  . Years of education: Not on file  . Highest education level: Not on file  Occupational History  . Occupation: CNA/ CPT  Social Needs  . Financial resource strain: Not on file  . Food insecurity:    Worry: Not on file    Inability: Not on file  . Transportation needs:    Medical: Not on file    Non-medical: Not on file  Tobacco Use  . Smoking status: Never Smoker  . Smokeless tobacco: Never Used  Substance and Sexual Activity  . Alcohol use: Not Currently  Alcohol/week: 0.0 standard drinks    Comment: occasionally  . Drug use: No  . Sexual activity: Yes    Birth control/protection: None  Lifestyle  . Physical activity:    Days per week: Not on file    Minutes per session: Not on file  . Stress: Not on file  Relationships  . Social connections:    Talks on phone: Not on file    Gets together: Not on file    Attends religious service: Not on file    Active member of club or organization: Not on file    Attends meetings of clubs or organizations: Not on file    Relationship status: Not on file  . Intimate  partner violence:    Fear of current or ex partner: Not on file    Emotionally abused: Not on file    Physically abused: Not on file    Forced sexual activity: Not on file  Other Topics Concern  . Not on file  Social History Narrative  . Not on file   Family History  Problem Relation Age of Onset  . Arthritis Mother   . Ovarian cancer Mother   . Hyperlipidemia Mother   . Hypertension Mother   . Irritable bowel syndrome Mother   . Arthritis Father   . Hypertension Father   . Asthma Father   . Asthma Sister   . Arthritis Paternal Grandmother    No current facility-administered medications on file prior to encounter.    Current Outpatient Medications on File Prior to Encounter  Medication Sig Dispense Refill  . cetirizine (ZYRTEC) 10 MG tablet Take 1 tablet (10 mg total) by mouth daily. 90 tablet 3  . levocetirizine (XYZAL) 5 MG tablet TAKE ONE TABLET BY MOUTH EACH EVENING 90 tablet 3  . montelukast (SINGULAIR) 10 MG tablet Take 1 tablet (10 mg total) by mouth at bedtime. 90 tablet 3  . Multiple Vitamin (MULTIVITAMIN WITH MINERALS) TABS tablet Take 1 tablet by mouth at bedtime.    . betamethasone dipropionate (DIPROLENE) 0.05 % cream Apply topically 2 (two) times daily as needed. (Patient taking differently: Apply 1 application topically 2 (two) times daily as needed (eczema). ) 30 g 0  . butalbital-acetaminophen-caffeine (FIORICET, ESGIC) 50-325-40 MG tablet Take 1 tablet by mouth 2 (two) times daily as needed for headache. 14 tablet 5  . pyridOXINE (VITAMIN B-6) 50 MG tablet Take 1 tablet (50 mg total) by mouth every 8 (eight) hours as needed. (Patient taking differently: Take 50 mg by mouth daily as needed (stomach pain). ) 20 tablet 0   Allergies  Allergen Reactions  . Iodine Itching and Swelling  . 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   . Omeprazole Anaphylaxis  and Shortness Of Breath  . Penicillins Anaphylaxis and Shortness Of Breath    Has patient had a PCN reaction causing immediate rash, facial/tongue/throat swelling, SOB or lightheadedness with hypotension: Yes Has patient had a PCN reaction causing severe rash involving mucus membranes or skin necrosis: No Has patient had a PCN reaction that required hospitalization: Unknown Has patient had a PCN reaction occurring within the last 10 years: No If all of the above answers are "NO", then may proceed with Cephalosporin use.   . Shellfish Allergy Anaphylaxis and Shortness Of Breath  . Soap Itching    ANYTHING WITH FRAGRANCE    I  have reviewed patient's Past Medical Hx, Surgical Hx, Family Hx, Social Hx, medications and allergies.   Review of Systems  Constitutional: Negative.   Gastrointestinal: Positive for nausea and vomiting. Negative for abdominal pain, constipation and diarrhea.  Genitourinary: Negative.     OBJECTIVE Patient Vitals for the past 24 hrs:  BP Temp Pulse Resp SpO2  10/17/18 2153 137/76 - (!) 111 - -  10/17/18 2023 (!) 142/88 98.6 F (37 C) (!) 113 18 95 %   Constitutional: Well-developed, well-nourished female in no acute distress.  Cardiovascular: normal rate & rhythm, no murmur Respiratory: normal rate and effort. Lung sounds clear throughout GI: Abd soft, non-tender, Pos BS x 4. No guarding or rebound tenderness MS: Extremities nontender, no edema, normal ROM Neurologic: Alert and oriented x 4.     LAB RESULTS Results for orders placed or performed during the hospital encounter of 10/17/18 (from the past 24 hour(s))  Urinalysis, Routine w reflex microscopic     Status: Abnormal   Collection Time: 10/17/18  8:19 PM  Result Value Ref Range   Color, Urine YELLOW YELLOW   APPearance CLEAR CLEAR   Specific Gravity, Urine 1.015 1.005 - 1.030   pH 7.5 5.0 - 8.0   Glucose, UA NEGATIVE NEGATIVE mg/dL   Hgb urine dipstick NEGATIVE NEGATIVE   Bilirubin Urine  NEGATIVE NEGATIVE   Ketones, ur 15 (A) NEGATIVE mg/dL   Protein, ur NEGATIVE NEGATIVE mg/dL   Nitrite NEGATIVE NEGATIVE   Leukocytes, UA NEGATIVE NEGATIVE    IMAGING No results found.  MAU COURSE Orders Placed This Encounter  Procedures  . Urinalysis, Routine w reflex microscopic  . Discharge patient   Meds ordered this encounter  Medications  . ondansetron (ZOFRAN-ODT) disintegrating tablet 8 mg    MDM FHT present via doppler.  Pt with elevated BP. Per review of records has had elevated BP in the office and during MAU visits, likely has CHTN.   U/a with 5 of ketones. Gave patient option of oral meds vs IV fluids. Will start with zofran odt & see if she tolerates water.  Patient feels better after zofran & able to tolerate water without vomiting or worsening of symptoms. Has zofran at home.   ASSESSMENT 1. Nausea and vomiting during pregnancy   2. [redacted] weeks gestation of pregnancy     PLAN Discharge home in stable condition. F/u with ob/gyn Discussed reasons to return to MAU Take zofran at home prn  Follow-up Information    [place] Follow up.   Contact information: [address] [place] [address] [phone]          Allergies as of 10/17/2018      Reactions   Iodine Itching, Swelling   Ivp Dye [iodinated Diagnostic Agents] Anaphylaxis   Dripped IVP dye on her arm after taking an IV out of a patient and had angioedema within 15 mins.    Omeprazole Anaphylaxis, Shortness Of Breath   Penicillins Anaphylaxis, Shortness Of Breath   Has patient had a PCN reaction causing immediate rash, facial/tongue/throat swelling, SOB or lightheadedness with hypotension: Yes Has patient had a PCN reaction causing severe rash involving mucus membranes or skin necrosis: No Has patient had a PCN reaction that required hospitalization: Unknown Has patient had a PCN reaction occurring within the last 10 years: No If all of the above  answers are "NO", then may proceed with Cephalosporin use.   Shellfish Allergy Anaphylaxis, Shortness Of Breath   Soap  Itching   ANYTHING WITH FRAGRANCE      Medication List    TAKE these medications   betamethasone dipropionate 0.05 % cream Commonly known as:  DIPROLENE Apply topically 2 (two) times daily as needed. What changed:    how much to take  reasons to take this   butalbital-acetaminophen-caffeine 50-325-40 MG tablet Commonly known as:  FIORICET, ESGIC Take 1 tablet by mouth 2 (two) times daily as needed for headache.   cetirizine 10 MG tablet Commonly known as:  ZYRTEC Take 1 tablet (10 mg total) by mouth daily.   levocetirizine 5 MG tablet Commonly known as:  XYZAL TAKE ONE TABLET BY MOUTH EACH EVENING   montelukast 10 MG tablet Commonly known as:  SINGULAIR Take 1 tablet (10 mg total) by mouth at bedtime.   multivitamin with minerals Tabs tablet Take 1 tablet by mouth at bedtime.   pyridOXINE 50 MG tablet Commonly known as:  VITAMIN B-6 Take 1 tablet (50 mg total) by mouth every 8 (eight) hours as needed. What changed:    when to take this  reasons to take this        Judeth HornLawrence, Royalty Domagala, NP 10/18/2018  1:07 AM

## 2018-10-17 NOTE — Discharge Instructions (Signed)
Warning Signs During Pregnancy  A pregnancy lasts about 40 weeks, starting from the first day of your last period until the baby is born. Pregnancy is divided into three phases called trimesters.  · The first trimester refers to week 1 through week 13 of pregnancy.  · The second trimester is the start of week 14 through the end of week 27.  · The third trimester is the start of week 28 until you deliver your baby.  During each trimester of pregnancy, certain signs and symptoms may indicate a problem. Talk with your health care provider about your current health and any medical conditions you have. Make sure you know the symptoms that you should watch for and report.  How does this affect me?    Warning signs in the first trimester  While some changes during the first trimester may be uncomfortable, most do not represent a serious problem. Let your health care provider know if you have any of the following warning signs in the first trimester:  · You cannot eat or drink without vomiting, and this lasts for longer than a day.  · You have vaginal bleeding or spotting along with menstrual-like cramping.  · You have diarrhea for longer than a day.  · You have a fever or other signs of infection, such as:  ? Pain or burning when you urinate.  ? Foul smelling or thick or yellowish vaginal discharge.  Warning signs in the second trimester  As your baby grows and changes during the second trimester, there are additional signs and symptoms that may indicate a problem. These include:  · Signs and symptoms of infection, including a fever.  · Signs or symptoms of a miscarriage or preterm labor, such as regular contractions, menstrual-like cramping, or lower abdominal pain.  · Bloody or watery vaginal discharge or obvious vaginal bleeding.  · Feeling like your heart is pounding.  · Having trouble breathing.  · Nausea, vomiting, or diarrhea that lasts for longer than a day.  · Craving non-food items, such as clay, chalk, or dirt.  This may be a sign of a very treatable medical condition called pica.  Later in your second trimester, watch for signs and symptoms of a serious medical condition called preeclampsia.These include:  · Changes in your vision.  · A severe headache that does not go away.  · Nausea and vomiting.  It is also important to notice if your baby stops moving or moves less than usual during this time.  Warning signs in the third trimester  As you approach the third trimester, your baby is growing and your body is preparing for the birth of your baby. In your third trimester, be sure to let your health care provider know if:  · You have signs and symptoms of infection, including a fever.  · You have vaginal bleeding.  · You notice that your baby is moving less than usual or is not moving.  · You have nausea, vomiting, or diarrhea that lasts for longer than a day.  · You have a severe headache that does not go away.  · You have vision changes, including seeing spots or having blurry or double vision.  · You have increased swelling in your hands or face.  How does this affect my baby?  Throughout your pregnancy, always report any of the warning signs of a problem to your health care provider. This can help prevent complications that may affect your baby, including:  · Increased risk   for premature birth.  · Infection that may be transmitted to your baby.  · Increased risk for stillbirth.  Contact a health care provider if:  · You have any of the warning signs of a problem for the current trimester of your pregnancy.  · Any of the following apply to you during any trimester of pregnancy:  ? You have strong emotions, such as sadness or anxiety, that interfere with work or personal relationships.  ? You feel unsafe in your home and need help finding a safe place to live.  ? You are using tobacco products, alcohol, or drugs and you need help to stop.  Get help right away if:  You have signs or symptoms of labor before 37 weeks of  pregnancy. These include:  · Contractions that are 5 minutes or less apart, or that increase in frequency, intensity, or length.  · Sudden, sharp abdominal pain or low back pain.  · Uncontrolled gush or trickle of fluid from your vagina.  Summary  · A pregnancy lasts about 40 weeks, starting from the first day of your last period until the baby is born. Pregnancy is divided into three phases called trimesters. Each trimester has warning signs to watch for.  · Always report any warning signs to your health care provider in order to prevent complications that may affect both you and your baby.  · Talk with your health care provider about your current health and any medical conditions you have. Make sure you know the symptoms that you should watch for and report.  This information is not intended to replace advice given to you by your health care provider. Make sure you discuss any questions you have with your health care provider.  Document Released: 06/16/2017 Document Revised: 06/16/2017 Document Reviewed: 06/16/2017  Elsevier Interactive Patient Education © 2019 [employer] Inc[employer]

## 2019-02-23 ENCOUNTER — Inpatient Hospital Stay (HOSPITAL_COMMUNITY)
Admission: AD | Admit: 2019-02-23 | Discharge: 2019-02-23 | Disposition: A | Discharge status: Home / Self Care | Payer: PRIVATE HEALTH INSURANCE | Attending: Obstetrics and Gynecology | Admitting: Obstetrics and Gynecology

## 2019-02-23 ENCOUNTER — Other Ambulatory Visit: Payer: Self-pay

## 2019-02-23 ENCOUNTER — Encounter (HOSPITAL_COMMUNITY): Payer: Self-pay

## 2019-02-23 DIAGNOSIS — R51 Headache: Secondary | ICD-10-CM | POA: Diagnosis present

## 2019-02-23 DIAGNOSIS — Z880 Allergy status to penicillin: Secondary | ICD-10-CM | POA: Diagnosis not present

## 2019-02-23 DIAGNOSIS — O10013 Pre-existing essential hypertension complicating pregnancy, third trimester: Secondary | ICD-10-CM | POA: Insufficient documentation

## 2019-02-23 DIAGNOSIS — Z3A35 35 weeks gestation of pregnancy: Secondary | ICD-10-CM | POA: Diagnosis not present

## 2019-02-23 LAB — URINALYSIS, ROUTINE W REFLEX MICROSCOPIC
Bilirubin Urine: NEGATIVE
Glucose, UA: NEGATIVE mg/dL
Hgb urine dipstick: NEGATIVE
Ketones, ur: NEGATIVE mg/dL
Leukocytes,Ua: NEGATIVE
Nitrite: NEGATIVE
Protein, ur: NEGATIVE mg/dL
Specific Gravity, Urine: 1.003 — ABNORMAL LOW (ref 1.005–1.030)
pH: 6 (ref 5.0–8.0)

## 2019-02-23 LAB — COMPREHENSIVE METABOLIC PANEL
ALT: 30 U/L (ref 0–44)
AST: 38 U/L (ref 15–41)
Albumin: 2.8 g/dL — ABNORMAL LOW (ref 3.5–5.0)
Alkaline Phosphatase: 79 U/L (ref 38–126)
Anion gap: 7 (ref 5–15)
BUN: 8 mg/dL (ref 6–20)
CO2: 23 mmol/L (ref 22–32)
Calcium: 9.1 mg/dL (ref 8.9–10.3)
Chloride: 107 mmol/L (ref 98–111)
Creatinine, Ser: 0.51 mg/dL (ref 0.44–1.00)
GFR calc Af Amer: >60 mL/min (ref >60)
GFR calc non Af Amer: >60 mL/min (ref >60)
Glucose, Bld: 90 mg/dL (ref 70–99)
Potassium: 4.4 mmol/L (ref 3.5–5.1)
Sodium: 137 mmol/L (ref 135–145)
Total Bilirubin: 0.4 mg/dL (ref 0.3–1.2)
Total Protein: 5.9 g/dL — ABNORMAL LOW (ref 6.5–8.1)

## 2019-02-23 LAB — CBC
HCT: 36.3 % (ref 36.0–46.0)
Hemoglobin: 12 g/dL (ref 12.0–15.0)
MCH: 29.6 pg (ref 26.0–34.0)
MCHC: 33.1 g/dL (ref 30.0–36.0)
MCV: 89.4 fL (ref 80.0–100.0)
Platelets: 200 10*3/uL (ref 150–400)
RBC: 4.06 MIL/uL (ref 3.87–5.11)
RDW: 13.6 % (ref 11.5–15.5)
WBC: 17.5 10*3/uL — ABNORMAL HIGH (ref 4.0–10.5)
nRBC: 0 % (ref 0.0–0.2)

## 2019-02-23 LAB — RAPID URINE DRUG SCREEN, HOSP PERFORMED
Amphetamines: NOT DETECTED
Barbiturates: NOT DETECTED
Benzodiazepines: NOT DETECTED
Cocaine: NOT DETECTED
Opiates: NOT DETECTED
Tetrahydrocannabinol: NOT DETECTED

## 2019-02-23 LAB — PROTEIN / CREATININE RATIO, URINE
Creatinine, Urine: 15.47 mg/dL
Total Protein, Urine: <6 mg/dL

## 2019-02-23 NOTE — MAU Note (Signed)
Had a headache yesterday, took her meds- went away.  Today at work, head started hurting, checked her bp 168/108, p 110. Denies visual changes,  Epigastric pain or change in swelling.

## 2019-02-23 NOTE — MAU Provider Note (Signed)
History     CSN: [id number]  Arrival date and time: 02/23/19 1636   First Provider Initiated Contact with Patient 02/23/19 1725      Chief Complaint  Patient presents with  . Hypertension   Mikayla Briggs is a 38 y.o. G1P0 at 5760w1d who presents today with hypertension and a headache. She feels she got overheated at work and that caused her headache and her blood pressure to go up. She states that the headache is getting better on its own. She rates it a 2/10 at this time. The headache is left unilateral. She has a history of migraines. She states that she took her blood pressure at work today it was 168/108. She denies any hx of CHTN and she is not on any medications for blood pressure. She denies any visual disturbances or RUQ pain. She denies any complications with this pregnancy. She denies any contractions, VB or  LOF. She reports normal fetal movement. Next appt in the office 02/27/2019.   Hypertension    OB History    Gravida  1   Para      Term      Preterm      AB      Living        SAB      TAB      Ectopic      Multiple      Live Births              Past Medical History:  Diagnosis Date  . 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 Migraines   . 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 Seasonal allergies   . Wears contact lenses     Past Surgical History:  Procedure Laterality Date  . CHOLECYSTECTOMY  11/2003  . CHROMOPERTUBATION N/A 05/08/2018   Procedure: CHROMOPERTUBATION;  Surgeon: Marcelle OverlieGrewal, Michelle, MD;  [facility] [place];  Service: Gynecology;  Laterality: N/A;  . 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 KNEE ARTHROSCOPY Left 2000  . KNEE  ARTHROSCOPY WITH MEDIAL MENISECTOMY Right 10/10/2014   Procedure: RIGHT KNEE ARTHROSCOPY; RESECTION PLICA; CHONDROPLASTY, AND PARTIAL MEDIAL MENISCECTOMY;  Surgeon: Loreta Aveaniel F Murphy, MD;  Location: [facility];  Service: Orthopedics;  Laterality: Right;  . LAPAROSCOPY N/A 05/08/2018   Procedure: LAPAROSCOPY DIAGNOSTIC;  Surgeon: Marcelle OverlieGrewal, Michelle, MD;  [facility] [place];  Service: Gynecology;  Laterality: N/A;  fulgeration on endometriosis  . MAXILLARY ANTROSTOMY Bilateral 09/07/2016   Procedure: MAXILLARY ANTROSTOMY WITH TISSUE REMOVAL;  Surgeon: Linus Salmonshapman McQueen, MD;  [facility];  Service: ENT;  Laterality: Bilateral;  . NASAL TURBINATE REDUCTION Bilateral 09/07/2016   Procedure: TURBINATE REDUCTION/SUBMUCOSAL RESECTION;  Surgeon: Linus Salmonshapman McQueen, MD;  [facility];  Service: ENT;  Laterality: Bilateral;  . SEPTOPLASTY N/A 09/07/2016   Procedure: SEPTOPLASTY;  Surgeon: Linus Salmonshapman McQueen, MD;  [facility];  Service: ENT;  Laterality: N/A;  . TONSILLECTOMY AND ADENOIDECTOMY    . WISDOM TOOTH EXTRACTION      Family History  Problem Relation Age of Onset  . Arthritis Mother   . Ovarian cancer Mother   . Hyperlipidemia Mother   . Hypertension Mother   .  Irritable bowel syndrome Mother   . Arthritis Father   . Hypertension Father   . Asthma Father   . Asthma Sister   . Arthritis Paternal Grandmother     Social History   Tobacco Use  . Smoking status: Never Smoker  . Smokeless tobacco: Never Used  Substance Use Topics  . Alcohol use: Not Currently    Alcohol/week: 0.0 standard drinks    Comment: occasionally  . Drug use: No    Allergies:  Allergies  Allergen Reactions  . Iodine Itching and Swelling  . 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   . Omeprazole Anaphylaxis and Shortness Of  Breath  . Penicillins Anaphylaxis and Shortness Of Breath    Has patient had a PCN reaction causing immediate rash, facial/tongue/throat swelling, SOB or lightheadedness with hypotension: Yes Has patient had a PCN reaction causing severe rash involving mucus membranes or skin necrosis: No Has patient had a PCN reaction that required hospitalization: Unknown Has patient had a PCN reaction occurring within the last 10 years: No If all of the above answers are "NO", then may proceed with Cephalosporin use.   . Shellfish Allergy Anaphylaxis and Shortness Of Breath  . Soap Itching    ANYTHING WITH FRAGRANCE    Medications Prior to Admission  Medication Sig Dispense Refill Last Dose  . betamethasone dipropionate (DIPROLENE) 0.05 % cream Apply topically 2 (two) times daily as needed. (Patient taking differently: Apply 1 application topically 2 (two) times daily as needed (eczema). ) 30 g 0   . butalbital-acetaminophen-caffeine (FIORICET, ESGIC) 50-325-40 MG tablet Take 1 tablet by mouth 2 (two) times daily as needed for headache. 14 tablet 5   . cetirizine (ZYRTEC) 10 MG tablet Take 1 tablet (10 mg total) by mouth daily. 90 tablet 3   . levocetirizine (XYZAL) 5 MG tablet TAKE ONE TABLET BY MOUTH EACH EVENING 90 tablet 3   . montelukast (SINGULAIR) 10 MG tablet Take 1 tablet (10 mg total) by mouth at bedtime. 90 tablet 3   . Multiple Vitamin (MULTIVITAMIN WITH MINERALS) TABS tablet Take 1 tablet by mouth at bedtime.     . pyridOXINE (VITAMIN B-6) 50 MG tablet Take 1 tablet (50 mg total) by mouth every 8 (eight) hours as needed. (Patient taking differently: Take 50 mg by mouth daily as needed (stomach pain). ) 20 tablet 0     Review of Systems Physical Exam   Blood pressure 121/86, pulse (!) 102, temperature 98.2 F (36.8 C), temperature source Oral, resp. rate 20, height 5' 6.5" (1.689 m), weight 131.6 kg, last menstrual period 06/22/2018, SpO2 100 %.  Physical Exam  Nursing note and vitals  reviewed. Constitutional: She is oriented to person, place, and time. She appears well-developed and well-nourished. No distress.  HENT:  Head: Normocephalic.  Cardiovascular: Normal rate.  Respiratory: Effort normal.  GI: Soft. There is no abdominal tenderness. There is no rebound.  Musculoskeletal:        General: No edema.  Neurological: She is alert and oriented to person, place, and time. She has normal reflexes.  Skin: Skin is warm and dry.  Psychiatric: She has a normal mood and affect.   NST:  Baseline: 115 Variability: moderate Accels: 15x15 Decels: none Toco: none   Results for orders placed or performed during the hospital encounter of 02/23/19 (from the past 24 hour(s))  Urinalysis,  Routine w reflex microscopic     Status: Abnormal   Collection Time: 02/23/19  5:14 PM  Result Value Ref Range   Color, Urine STRAW (A) YELLOW   APPearance CLEAR CLEAR   Specific Gravity, Urine 1.003 (L) 1.005 - 1.030   pH 6.0 5.0 - 8.0   Glucose, UA NEGATIVE NEGATIVE mg/dL   Hgb urine dipstick NEGATIVE NEGATIVE   Bilirubin Urine NEGATIVE NEGATIVE   Ketones, ur NEGATIVE NEGATIVE mg/dL   Protein, ur NEGATIVE NEGATIVE mg/dL   Nitrite NEGATIVE NEGATIVE   Leukocytes,Ua NEGATIVE NEGATIVE  Protein / creatinine ratio, urine     Status: None   Collection Time: 02/23/19  5:14 PM  Result Value Ref Range   Creatinine, Urine 15.47 mg/dL   Total Protein, Urine <6 mg/dL   Protein Creatinine Ratio NOT CALCULATED 0.00 - 0.15 mg/mg[Cre]  Urine rapid drug screen (hosp performed)     Status: None   Collection Time: 02/23/19  5:31 PM  Result Value Ref Range   Opiates NONE DETECTED NONE DETECTED   Cocaine NONE DETECTED NONE DETECTED   Benzodiazepines NONE DETECTED NONE DETECTED   Amphetamines NONE DETECTED NONE DETECTED   Tetrahydrocannabinol NONE DETECTED NONE DETECTED   Barbiturates NONE DETECTED NONE DETECTED  CBC     Status: Abnormal   Collection Time: 02/23/19  5:41 PM  Result Value Ref  Range   WBC 17.5 (H) 4.0 - 10.5 K/uL   RBC 4.06 3.87 - 5.11 MIL/uL   Hemoglobin 12.0 12.0 - 15.0 g/dL   HCT 16.136.3 09.636.0 - 04.546.0 %   MCV 89.4 80.0 - 100.0 fL   MCH 29.6 26.0 - 34.0 pg   MCHC 33.1 30.0 - 36.0 g/dL   RDW 40.913.6 81.111.5 - 91.415.5 %   Platelets 200 150 - 400 K/uL   nRBC 0.0 0.0 - 0.2 %  Comprehensive metabolic panel     Status: Abnormal   Collection Time: 02/23/19  5:41 PM  Result Value Ref Range   Sodium 137 135 - 145 mmol/L   Potassium 4.4 3.5 - 5.1 mmol/L   Chloride 107 98 - 111 mmol/L   CO2 23 22 - 32 mmol/L   Glucose, Bld 90 70 - 99 mg/dL   BUN 8 6 - 20 mg/dL   Creatinine, Ser 7.820.51 0.44 - 1.00 mg/dL   Calcium 9.1 8.9 - 95.610.3 mg/dL   Total Protein 5.9 (L) 6.5 - 8.1 g/dL   Albumin 2.8 (L) 3.5 - 5.0 g/dL   AST 38 15 - 41 U/L   ALT 30 0 - 44 U/L   Alkaline Phosphatase 79 38 - 126 U/L   Total Bilirubin 0.4 0.3 - 1.2 mg/dL   GFR calc non Af Amer >60 >60 mL/min   GFR calc Af Amer >60 >60 mL/min   Anion gap 7 5 - 15   Patient Vitals for the past 24 hrs:  BP Temp Temp src Pulse Resp SpO2 Height Weight  02/23/19 1816 (!) 142/83 - - 100 - - - -  02/23/19 1801 134/82 - - (!) 103 - - - -  02/23/19 1753 (!) 143/78 - - 100 - - - -  02/23/19 1746 112/74 - - 98 - - - -  02/23/19 1731 119/80 - - (!) 114 - - - -  02/23/19 1713 121/86 - - (!) 102 - - - -  02/23/19 1655 (!) 144/96 98.2 F (36.8 C) Oral (!) 111 20 100 % 5' 6.5" (1.689 m) 131.6 kg    MAU  Course  Procedures  MDM Patient with elevated blood pressure today. No sx or severe features at this time. Likely chronic hypertension but not ever formally diagnosed. Blood pressure elevated on several visits at [facility] office around 16/17 weeks as well as a visit here at 16 weeks. Has appt 02/27/2019 in the office. Consider BP at that visit and make plan at that time. Headache that she had earlier has resolved on its own.   Assessment and Plan   1. Hypertension in pregnancy, essential, antepartum, third trimester   2. [redacted] weeks  gestation of pregnancy    DC home Comfort measures reviewed  3rd Trimester precautions  PTL precautions  Fetal kick counts RX: none  Return to MAU as needed FU with OB as planned  Follow-up Information    , [facility] Follow up.   Why: as scheduled  Contact information: [address] [place] [id number] [place] DNP, [place]  02/23/19  7:02 PM

## 2019-02-23 NOTE — MAU Note (Signed)
Pt brought back, needed to use restroom, will do that first.

## 2019-02-23 NOTE — Discharge Instructions (Signed)
Hypertension During Pregnancy ° °Hypertension, commonly called high blood pressure, is when the force of blood pumping through your arteries is too strong. Arteries are blood vessels that carry blood from the heart throughout the body. Hypertension during pregnancy can cause problems for you and your baby. Your baby may be born early (prematurely) or may not weigh as much as he or she should at birth. Very bad cases of hypertension during pregnancy can be life-threatening. °Different types of hypertension can occur during pregnancy. These include: °· Chronic hypertension. This happens when: °? You have hypertension before pregnancy and it continues during pregnancy. °? You develop hypertension before you are [redacted] weeks pregnant, and it continues during pregnancy. °· Gestational hypertension. This is hypertension that develops after the 20th week of pregnancy. °· Preeclampsia, also called toxemia of pregnancy. This is a very serious type of hypertension that develops during pregnancy. It can be very dangerous for you and your baby. °? In rare cases, you may develop preeclampsia after giving birth (postpartum preeclampsia). This usually occurs within 48 hours after childbirth but may occur up to 6 weeks after giving birth. °Gestational hypertension and preeclampsia usually go away within 6 weeks after your baby is born. Women who have hypertension during pregnancy have a greater chance of developing hypertension later in life or during future pregnancies. °What are the causes? °The exact cause of hypertension during pregnancy is not known. °What increases the risk? °There are certain factors that make it more likely for you to develop hypertension during pregnancy. These include: °· Having hypertension during a previous pregnancy or prior to pregnancy. °· Being overweight. °· Being age 35 or older. °· Being pregnant for the first time. °· Being pregnant with more than one baby. °· Becoming pregnant using fertilization  methods such as IVF (in vitro fertilization). °· Having diabetes, kidney problems, or systemic lupus erythematosus. °· Having a family history of hypertension. °What are the signs or symptoms? °Chronic hypertension and gestational hypertension rarely cause symptoms. Preeclampsia causes symptoms, which may include: °· Increased protein in your urine. Your health care provider will check for this at every visit before you give birth (prenatal visit). °· Severe headaches. °· Sudden weight gain. °· Swelling of the hands, face, legs, and feet. °· Nausea and vomiting. °· Vision problems, such as blurred or double vision. °· Numbness in the face, arms, legs, and feet. °· Dizziness. °· Slurred speech. °· Sensitivity to bright lights. °· Abdominal pain. °· Convulsions or seizures. °How is this diagnosed? °You may be diagnosed with hypertension during a routine prenatal exam. At each prenatal visit, you may: °· Have a urine test to check for high amounts of protein in your urine. °· Have your blood pressure checked. A blood pressure reading is given as two numbers, such as "120 over 80" (or 120/80). The first ("top") number is a measure of the pressure in your arteries when your heart beats (systolic pressure). The second ("bottom") number is a measure of the pressure in your arteries as your heart relaxes between beats (diastolic pressure). Blood pressure is measured in a unit called mm Hg. For most women, a normal blood pressure reading is: °? Systolic: below 120. °? Diastolic: below 80. °The type of hypertension that you are diagnosed with depends on your test results and when your symptoms developed. °· Chronic hypertension is usually diagnosed before 20 weeks of pregnancy. °· Gestational hypertension is usually diagnosed after 20 weeks of pregnancy. °· Hypertension with high amounts of protein in   the urine is diagnosed as preeclampsia. °· Blood pressure measurements that stay above 160 systolic, or above 110 diastolic,  are signs of severe preeclampsia. °How is this treated? °Treatment for hypertension during pregnancy varies depending on the type of hypertension you have and how serious it is. °· If you take medicines called ACE inhibitors to treat chronic hypertension, you may need to switch medicines. ACE inhibitors should not be taken during pregnancy. °· If you have gestational hypertension, you may need to take blood pressure medicine. °· If you are at risk for preeclampsia, your health care provider may recommend that you take a low-dose aspirin during your pregnancy. °· If you have severe preeclampsia, you may need to be hospitalized so you and your baby can be monitored closely. You may also need to take medicine (magnesium sulfate) to prevent seizures and to lower blood pressure. This medicine may be given as an injection or through an IV. °· In some cases, if your condition gets worse, you may need to deliver your baby early. °Follow these instructions at home: °Eating and drinking ° °· Drink enough fluid to keep your urine pale yellow. °· Avoid caffeine. °Lifestyle °· Do not use any products that contain nicotine or tobacco, such as cigarettes and e-cigarettes. If you need help quitting, ask your health care provider. °· Do not use alcohol or drugs. °· Avoid stress as much as possible. Rest and get plenty of sleep. °General instructions °· Take over-the-counter and prescription medicines only as told by your health care provider. °· While lying down, lie on your left side. This keeps pressure off your major blood vessels. °· While sitting or lying down, raise (elevate) your feet. Try putting some pillows under your lower legs. °· Exercise regularly. Ask your health care provider what kinds of exercise are best for you. °· Keep all prenatal and follow-up visits as told by your health care provider. This is important. °Contact a health care provider if: °· You have symptoms that your health care provider told you may  require more treatment or monitoring, such as: °? Nausea or vomiting. °? Headache. °Get help right away if you have: °· Severe abdominal pain that does not get better with treatment. °· A severe headache that does not get better. °· Vomiting that does not get better. °· Sudden, rapid weight gain. °· Sudden swelling in your hands, ankles, or face. °· Vaginal bleeding. °· Blood in your urine. °· Fewer movements from your baby than usual. °· Blurred or double vision. °· Muscle twitching or sudden muscle tightening (spasms). °· Shortness of breath. °· Blue fingernails or lips. °Summary °· Hypertension, commonly called high blood pressure, is when the force of blood pumping through your arteries is too strong. °· Hypertension during pregnancy can cause problems for you and your baby. °· Treatment for hypertension during pregnancy varies depending on the type of hypertension you have and how serious it is. °· Get help right away if you have symptoms that your health care provider told you to watch for. °This information is not intended to replace advice given to you by your health care provider. Make sure you discuss any questions you have with your health care provider. °Document Released: 05/18/2011 Document Revised: 08/16/2017 Document Reviewed: 02/13/2016 °Elsevier Interactive Patient Education © 2019 [employer] ° °

## 2019-02-24 ENCOUNTER — Inpatient Hospital Stay (HOSPITAL_COMMUNITY): Payer: PRIVATE HEALTH INSURANCE

## 2019-02-24 ENCOUNTER — Inpatient Hospital Stay (HOSPITAL_COMMUNITY)
Admission: AD | Admit: 2019-02-24 | Discharge: 2019-02-24 | Disposition: A | Discharge status: Home / Self Care | Payer: PRIVATE HEALTH INSURANCE | Attending: Obstetrics and Gynecology | Admitting: Obstetrics and Gynecology

## 2019-02-24 ENCOUNTER — Encounter (HOSPITAL_COMMUNITY): Payer: Self-pay

## 2019-02-24 DIAGNOSIS — Z3689 Encounter for other specified antenatal screening: Secondary | ICD-10-CM

## 2019-02-24 DIAGNOSIS — R2231 Localized swelling, mass and lump, right upper limb: Secondary | ICD-10-CM | POA: Diagnosis not present

## 2019-02-24 DIAGNOSIS — M25539 Pain in unspecified wrist: Secondary | ICD-10-CM | POA: Insufficient documentation

## 2019-02-24 DIAGNOSIS — W101XXA Fall (on)(from) sidewalk curb, initial encounter: Secondary | ICD-10-CM

## 2019-02-24 DIAGNOSIS — R030 Elevated blood-pressure reading, without diagnosis of hypertension: Secondary | ICD-10-CM | POA: Insufficient documentation

## 2019-02-24 DIAGNOSIS — O9A213 Injury, poisoning and certain other consequences of external causes complicating pregnancy, third trimester: Secondary | ICD-10-CM

## 2019-02-24 DIAGNOSIS — Z3A35 35 weeks gestation of pregnancy: Secondary | ICD-10-CM

## 2019-02-24 DIAGNOSIS — Y92480 Sidewalk as the place of occurrence of the external cause: Secondary | ICD-10-CM | POA: Insufficient documentation

## 2019-02-24 DIAGNOSIS — Z880 Allergy status to penicillin: Secondary | ICD-10-CM | POA: Insufficient documentation

## 2019-02-24 DIAGNOSIS — S59901A Unspecified injury of right elbow, initial encounter: Secondary | ICD-10-CM

## 2019-02-24 DIAGNOSIS — O26893 Other specified pregnancy related conditions, third trimester: Secondary | ICD-10-CM | POA: Insufficient documentation

## 2019-02-24 NOTE — Discharge Instructions (Signed)
Keep your appointment in the office on Tuesday Return if you have any sudden abdominal pain or vaginal bleeding. Expect the baby to move every day and seek medical care if your baby is not moving.

## 2019-02-24 NOTE — MAU Provider Note (Signed)
History     CSN: [id number]  Arrival date and time: 02/24/19 1537   First Provider Initiated Contact with Patient 02/24/19 1630      Chief Complaint  Patient presents with  . Fall   HPI Mikayla Briggs 38 y.o. [redacted]w[redacted]d Comes to MAU as she fell on a small lip on the sidewalk at [facility] at 1:05 pm - her right elbow has a lump on it and her right wrist was hit as well.  IIs worried her elbow is fractured.  Avoided hitting her abdomen.  Was seen in MAU yesterday and evaluated for high blood pressure.  Was sent home.  Today is having a few contractions on the monitor but client is not feeling any contractions.  Has a doctor's appointment on Tuesday.  Employee of [employer].  OB History    Gravida  1   Para      Term      Preterm      AB      Living        SAB      TAB      Ectopic      Multiple      Live Births              Past Medical History:  Diagnosis Date  . 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 Migraines   . 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 Seasonal allergies   . Wears contact lenses     Past Surgical History:  Procedure Laterality Date  . CHOLECYSTECTOMY  11/2003  . CHROMOPERTUBATION N/A 05/08/2018   Procedure: CHROMOPERTUBATION;  Surgeon: Dian Queen, MD;  Location: [facility];  Service: Gynecology;  Laterality: N/A;  . IMAGE GUIDED SINUS SURGERY N/A 09/07/2016   Procedure: IMAGE GUIDED SINUS SURGERY;  Surgeon: Beverly Gust, MD;  Location: [place];  Service: ENT;  Laterality: N/A;  STYKER GAVE DISK TO CECE 12-20 kp  . KNEE ARTHROSCOPY Left 2000  . KNEE ARTHROSCOPY WITH MEDIAL MENISECTOMY Right 10/10/2014   Procedure: RIGHT KNEE ARTHROSCOPY; RESECTION PLICA; CHONDROPLASTY, AND PARTIAL MEDIAL MENISCECTOMY;  Surgeon: Ninetta Lights, MD;  Location: [place];  Service: Orthopedics;   Laterality: Right;  . LAPAROSCOPY N/A 05/08/2018   Procedure: LAPAROSCOPY DIAGNOSTIC;  Surgeon: Dian Queen, MD;  Location: [facility];  Service: Gynecology;  Laterality: N/A;  fulgeration on endometriosis  . MAXILLARY ANTROSTOMY Bilateral 09/07/2016   Procedure: MAXILLARY ANTROSTOMY WITH TISSUE REMOVAL;  Surgeon: Beverly Gust, MD;  Location: [place];  Service: ENT;  Laterality: Bilateral;  . NASAL TURBINATE REDUCTION Bilateral 09/07/2016   Procedure: TURBINATE REDUCTION/SUBMUCOSAL RESECTION;  Surgeon: Beverly Gust, MD;  Location: [place];  Service: ENT;  Laterality: Bilateral;  . SEPTOPLASTY N/A 09/07/2016   Procedure: SEPTOPLASTY;  Surgeon: Beverly Gust, MD;  Location: [place];  Service: ENT;  Laterality: N/A;  . TONSILLECTOMY AND ADENOIDECTOMY    . WISDOM TOOTH EXTRACTION      Family History  Problem Relation Age of Onset  . Arthritis Mother   . Ovarian cancer Mother   . Hyperlipidemia Mother   . Hypertension Mother   . Irritable bowel syndrome Mother   . Arthritis Father   . Hypertension Father   . Asthma Father   . Asthma Sister   . Arthritis Paternal Grandmother     Social History  Tobacco Use  . Smoking status: Never Smoker  . Smokeless tobacco: Never Used  Substance Use Topics  . Alcohol use: Not Currently    Alcohol/week: 0.0 standard drinks    Comment: occasionally  . Drug use: No    Allergies:  Allergies  Allergen Reactions  . Iodine Itching and Swelling  . 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   . Omeprazole Anaphylaxis and Shortness Of Breath  . Penicillins Anaphylaxis and Shortness Of Breath    Has patient had a PCN reaction causing immediate rash, facial/tongue/throat swelling, SOB or lightheadedness with hypotension: Yes Has patient had a PCN reaction causing severe rash  involving mucus membranes or skin necrosis: No Has patient had a PCN reaction that required hospitalization: Unknown Has patient had a PCN reaction occurring within the last 10 years: No If all of the above answers are "NO", then may proceed with Cephalosporin use.   . Shellfish Allergy Anaphylaxis and Shortness Of Breath  . Soap Itching    ANYTHING WITH FRAGRANCE    Medications Prior to Admission  Medication Sig Dispense Refill Last Dose  . betamethasone dipropionate (DIPROLENE) 0.05 % cream Apply topically 2 (two) times daily as needed. (Patient taking differently: Apply 1 application topically 2 (two) times daily as needed (eczema). ) 30 g 0   . butalbital-acetaminophen-caffeine (FIORICET, ESGIC) 50-325-40 MG tablet Take 1 tablet by mouth 2 (two) times daily as needed for headache. 14 tablet 5   . cetirizine (ZYRTEC) 10 MG tablet Take 1 tablet (10 mg total) by mouth daily. 90 tablet 3   . levocetirizine (XYZAL) 5 MG tablet TAKE ONE TABLET BY MOUTH EACH EVENING 90 tablet 3   . montelukast (SINGULAIR) 10 MG tablet Take 1 tablet (10 mg total) by mouth at bedtime. 90 tablet 3   . Multiple Vitamin (MULTIVITAMIN WITH MINERALS) TABS tablet Take 1 tablet by mouth at bedtime.     . pyridOXINE (VITAMIN B-6) 50 MG tablet Take 1 tablet (50 mg total) by mouth every 8 (eight) hours as needed. (Patient taking differently: Take 50 mg by mouth daily as needed (stomach pain). ) 20 tablet 0     Review of Systems  Constitutional: Negative for fever.  Respiratory: Negative for cough and shortness of breath.   Gastrointestinal: Negative for abdominal pain.  Genitourinary: Negative for dysuria, vaginal bleeding and vaginal discharge.  Musculoskeletal:       Pain in right elbow and right wrist  Neurological: Negative for headaches.   Physical Exam   Blood pressure 126/85, pulse (!) 128, temperature 98.1 F (36.7 C), temperature source Oral, resp. rate 20, weight 130.7 kg, last menstrual period  06/22/2018, SpO2 98 %.  Physical Exam  Nursing note and vitals reviewed. Constitutional: She is oriented to person, place, and time. She appears well-developed and well-nourished. She appears distressed.  Crying and upset that she is back in the hospital again for the second time.  Upset about her elbow and thinks it is broken.  HENT:  Head: Normocephalic.  Eyes: EOM are normal.  Neck: Neck supple.  GI: Soft. There is no abdominal tenderness. There is no rebound and no guarding.  FHT baseline is 135 with moderate variability.  No decelerations.  Irregular and infrequent nonpainful contractions.  Greater than 15x15 accels.  Reactive strip.  Musculoskeletal: Normal range of motion.     Comments: Right wrist  is sore but no bruising seen.  Right elbow is bruised and has a firm knot below the elbow.  Client can move wrist with minimal pain.  Has some movement of elbow but with flexion, has severe pain at the place of bruising of the elbow.  Neurological: She is alert and oriented to person, place, and time.  Skin: Skin is warm and dry.  Psychiatric: She has a normal mood and affect.   Vitals:   02/24/19 1553 02/24/19 1620 02/24/19 1635 02/24/19 1645  BP: 126/85 (!) 142/88 (!) 135/104 130/85  Pulse: (!) 128 (!) 113 (!) 116 (!) 114  Resp: 20     Temp: 98.1 F (36.7 C)     TempSrc: Oral     SpO2:  97% 97% 98%  Weight: 130.7 kg       MAU Course  Procedures  MDM Intermittent elevated BPs but client is in pain.  Reviewed labs from yesterday when her BP was elevated - labs normal.  No further swelling (ankles are 1+), no headache, no blurred vision and no RUQ pain. Ice pack applied to elbow.  Will get X-rays of elbow. Was not broken by Xray today.   Advised  Client she may need reevaluation if elbow continues to worsen with pain, bruising (some initial worsening is normal but then should see improvement).   Consult with Dr. Jolayne Pantheronstant who agrees with the plan of care.  Assessment and Plan   Fall - did not hit abdomen Elbow injury without fracture Wrist pain due to fall Transient elevated BP - normal after ultrasound Reactive NST  Plan Keep your appointment in the office on Tuesday Return if you have any sudden abdominal pain or vaginal bleeding. Expect the baby to move every day and seek medical care if your baby is not moving. See AVS for additional info given to client.    Terri L Burleson 02/24/2019, 4:46 PM

## 2019-02-24 NOTE — MAU Note (Signed)
Pt tripped on a small step outside [employer] and fell on her right side on concrete. Hit elbow/forearm and wrist. They are scraped and bruised and pain is 8/10 there. Tried to protect baby and avoided hitting her abdomen.

## 2019-03-10 ENCOUNTER — Other Ambulatory Visit: Payer: Self-pay

## 2019-03-10 ENCOUNTER — Inpatient Hospital Stay (HOSPITAL_COMMUNITY): Payer: PRIVATE HEALTH INSURANCE | Admitting: Anesthesiology

## 2019-03-10 ENCOUNTER — Encounter (HOSPITAL_COMMUNITY): Payer: Self-pay

## 2019-03-10 ENCOUNTER — Inpatient Hospital Stay (HOSPITAL_COMMUNITY)
Admission: AD | Admit: 2019-03-10 | Discharge: 2019-03-12 | DRG: 768 | Disposition: A | Discharge status: Home / Self Care | Payer: PRIVATE HEALTH INSURANCE | Attending: Obstetrics and Gynecology | Admitting: Obstetrics and Gynecology

## 2019-03-10 DIAGNOSIS — Z1159 Encounter for screening for other viral diseases: Secondary | ICD-10-CM

## 2019-03-10 DIAGNOSIS — Z3A37 37 weeks gestation of pregnancy: Secondary | ICD-10-CM

## 2019-03-10 DIAGNOSIS — O99214 Obesity complicating childbirth: Secondary | ICD-10-CM | POA: Diagnosis present

## 2019-03-10 DIAGNOSIS — O99824 Streptococcus B carrier state complicating childbirth: Principal | ICD-10-CM | POA: Diagnosis present

## 2019-03-10 DIAGNOSIS — O26893 Other specified pregnancy related conditions, third trimester: Secondary | ICD-10-CM | POA: Diagnosis present

## 2019-03-10 LAB — SARS CORONAVIRUS 2 BY RT PCR (HOSPITAL ORDER, PERFORMED IN ~~LOC~~ HOSPITAL LAB): SARS Coronavirus 2: NEGATIVE

## 2019-03-10 LAB — CBC
HCT: 35.5 % — ABNORMAL LOW (ref 36.0–46.0)
Hemoglobin: 11.7 g/dL — ABNORMAL LOW (ref 12.0–15.0)
MCH: 29.8 pg (ref 26.0–34.0)
MCHC: 33 g/dL (ref 30.0–36.0)
MCV: 90.3 fL (ref 80.0–100.0)
Platelets: 180 10*3/uL (ref 150–400)
RBC: 3.93 MIL/uL (ref 3.87–5.11)
RDW: 14 % (ref 11.5–15.5)
WBC: 15.7 10*3/uL — ABNORMAL HIGH (ref 4.0–10.5)
nRBC: 0 % (ref 0.0–0.2)

## 2019-03-10 LAB — POCT FERN TEST: POCT Fern Test: POSITIVE

## 2019-03-10 MED ORDER — ONDANSETRON HCL 4 MG PO TABS: 4.0000 mg | ORAL_TABLET | ORAL | Status: DC | PRN

## 2019-03-10 MED ORDER — DIPHENHYDRAMINE HCL 25 MG PO CAPS: 25.0000 mg | ORAL_CAPSULE | Freq: Four times a day (QID) | ORAL | Status: DC | PRN

## 2019-03-10 MED ORDER — SODIUM CHLORIDE (PF) 0.9 % IJ SOLN
INTRAMUSCULAR | Status: DC | PRN
  Administered 2019-03-10: 12 mL/h via EPIDURAL

## 2019-03-10 MED ORDER — IBUPROFEN 600 MG PO TABS
600.0000 mg | ORAL_TABLET | Freq: Four times a day (QID) | ORAL | Status: DC
  Administered 2019-03-10 – 2019-03-12 (×7): 600 mg via ORAL
  Filled 2019-03-10 (×7): qty 1

## 2019-03-10 MED ORDER — MEDROXYPROGESTERONE ACETATE 150 MG/ML IM SUSP: 150.0000 mg | INTRAMUSCULAR | Status: DC | PRN

## 2019-03-10 MED ORDER — EPHEDRINE 5 MG/ML INJ: 10.0000 mg | INTRAVENOUS | Status: DC | PRN

## 2019-03-10 MED ORDER — LACTATED RINGERS IV SOLN: 500.0000 mL | INTRAVENOUS | Status: DC | PRN

## 2019-03-10 MED ORDER — PHENYLEPHRINE 40 MCG/ML (10ML) SYRINGE FOR IV PUSH (FOR BLOOD PRESSURE SUPPORT): 80.0000 ug | PREFILLED_SYRINGE | INTRAVENOUS | Status: DC | PRN

## 2019-03-10 MED ORDER — OXYTOCIN 40 UNITS IN NORMAL SALINE INFUSION - SIMPLE MED: 2.5000 [IU]/h | INTRAVENOUS | Status: DC

## 2019-03-10 MED ORDER — OXYCODONE-ACETAMINOPHEN 5-325 MG PO TABS: 1.0000 | ORAL_TABLET | ORAL | Status: DC | PRN

## 2019-03-10 MED ORDER — FENTANYL-BUPIVACAINE-NACL 0.5-0.125-0.9 MG/250ML-% EP SOLN
12.0000 mL/h | EPIDURAL | Status: DC | PRN
  Filled 2019-03-10: qty 250

## 2019-03-10 MED ORDER — LACTATED RINGERS IV SOLN
500.0000 mL | Freq: Once | INTRAVENOUS | Status: AC
  Administered 2019-03-10: 500 mL via INTRAVENOUS

## 2019-03-10 MED ORDER — LIDOCAINE HCL (PF) 1 % IJ SOLN
INTRAMUSCULAR | Status: DC | PRN
  Administered 2019-03-10: 10 mL via EPIDURAL

## 2019-03-10 MED ORDER — OXYCODONE-ACETAMINOPHEN 5-325 MG PO TABS: 2.0000 | ORAL_TABLET | ORAL | Status: DC | PRN

## 2019-03-10 MED ORDER — LORATADINE 10 MG PO TABS
10.0000 mg | ORAL_TABLET | Freq: Every day | ORAL | Status: DC
  Administered 2019-03-11: 10 mg via ORAL
  Filled 2019-03-10 (×2): qty 1

## 2019-03-10 MED ORDER — COCONUT OIL OIL: 1.0000 "application " | TOPICAL_OIL | Status: DC | PRN

## 2019-03-10 MED ORDER — ONDANSETRON HCL 4 MG/2ML IJ SOLN: 4.0000 mg | INTRAMUSCULAR | Status: DC | PRN

## 2019-03-10 MED ORDER — SOD CITRATE-CITRIC ACID 500-334 MG/5ML PO SOLN: 30.0000 mL | ORAL | Status: DC | PRN

## 2019-03-10 MED ORDER — PRENATAL MULTIVITAMIN CH
1.0000 | ORAL_TABLET | Freq: Every day | ORAL | Status: DC
  Administered 2019-03-11: 1 via ORAL
  Filled 2019-03-10 (×2): qty 1

## 2019-03-10 MED ORDER — VANCOMYCIN HCL IN DEXTROSE 1-5 GM/200ML-% IV SOLN
1000.0000 mg | Freq: Two times a day (BID) | INTRAVENOUS | Status: DC
  Administered 2019-03-10: 1000 mg via INTRAVENOUS
  Filled 2019-03-10: qty 200

## 2019-03-10 MED ORDER — CLINDAMYCIN PHOSPHATE 900 MG/50ML IV SOLN
900.0000 mg | Freq: Three times a day (TID) | INTRAVENOUS | Status: AC
  Administered 2019-03-10 – 2019-03-11 (×2): 900 mg via INTRAVENOUS
  Filled 2019-03-10 (×2): qty 50

## 2019-03-10 MED ORDER — MEASLES, MUMPS & RUBELLA VAC IJ SOLR: 0.5000 mL | Freq: Once | INTRAMUSCULAR | Status: DC

## 2019-03-10 MED ORDER — LACTATED RINGERS IV SOLN
INTRAVENOUS | Status: DC
  Administered 2019-03-10 (×2): via INTRAVENOUS

## 2019-03-10 MED ORDER — DIPHENHYDRAMINE HCL 50 MG/ML IJ SOLN: 12.5000 mg | INTRAMUSCULAR | Status: DC | PRN

## 2019-03-10 MED ORDER — SIMETHICONE 80 MG PO CHEW: 80.0000 mg | CHEWABLE_TABLET | ORAL | Status: DC | PRN

## 2019-03-10 MED ORDER — LIDOCAINE HCL (PF) 1 % IJ SOLN: 30.0000 mL | INTRAMUSCULAR | Status: DC | PRN

## 2019-03-10 MED ORDER — ACETAMINOPHEN 325 MG PO TABS: 650.0000 mg | ORAL_TABLET | ORAL | Status: DC | PRN

## 2019-03-10 MED ORDER — SENNOSIDES-DOCUSATE SODIUM 8.6-50 MG PO TABS
2.0000 | ORAL_TABLET | ORAL | Status: DC
  Administered 2019-03-10 – 2019-03-11 (×2): 2 via ORAL
  Filled 2019-03-10 (×2): qty 2

## 2019-03-10 MED ORDER — OXYTOCIN 40 UNITS IN NORMAL SALINE INFUSION - SIMPLE MED
1.0000 m[IU]/min | INTRAVENOUS | Status: DC
  Administered 2019-03-10: 2 m[IU]/min via INTRAVENOUS
  Filled 2019-03-10: qty 1000

## 2019-03-10 MED ORDER — MONTELUKAST SODIUM 10 MG PO TABS
10.0000 mg | ORAL_TABLET | Freq: Every day | ORAL | Status: DC
  Administered 2019-03-10 – 2019-03-11 (×2): 10 mg via ORAL
  Filled 2019-03-10 (×2): qty 1

## 2019-03-10 MED ORDER — BENZOCAINE-MENTHOL 20-0.5 % EX AERO
1.0000 "application " | INHALATION_SPRAY | CUTANEOUS | Status: DC | PRN
  Administered 2019-03-10: 1 via TOPICAL
  Filled 2019-03-10: qty 56

## 2019-03-10 MED ORDER — WITCH HAZEL-GLYCERIN EX PADS: 1.0000 "application " | MEDICATED_PAD | CUTANEOUS | Status: DC | PRN

## 2019-03-10 MED ORDER — DIBUCAINE (PERIANAL) 1 % EX OINT: 1.0000 "application " | TOPICAL_OINTMENT | CUTANEOUS | Status: DC | PRN

## 2019-03-10 MED ORDER — TETANUS-DIPHTH-ACELL PERTUSSIS 5-2.5-18.5 LF-MCG/0.5 IM SUSP: 0.5000 mL | Freq: Once | INTRAMUSCULAR | Status: DC

## 2019-03-10 MED ORDER — TERBUTALINE SULFATE 1 MG/ML IJ SOLN: 0.2500 mg | Freq: Once | INTRAMUSCULAR | Status: DC | PRN

## 2019-03-10 MED ORDER — LEVOCETIRIZINE DIHYDROCHLORIDE 5 MG PO TABS: 5.0000 mg | ORAL_TABLET | Freq: Every evening | ORAL | Status: DC

## 2019-03-10 MED ORDER — ONDANSETRON HCL 4 MG/2ML IJ SOLN
4.0000 mg | Freq: Four times a day (QID) | INTRAMUSCULAR | Status: DC | PRN
  Administered 2019-03-10: 4 mg via INTRAVENOUS
  Filled 2019-03-10: qty 2

## 2019-03-10 MED ORDER — OXYTOCIN BOLUS FROM INFUSION: 500.0000 mL | Freq: Once | INTRAVENOUS | Status: DC

## 2019-03-10 NOTE — H&P (Signed)
Mikayla Briggs is a 38 y.o. female presenting for ROM - 5am, clear fluid.  Pregnancy uncomplicated.    OB History    Gravida  1   Para      Term      Preterm      AB      Living        SAB      TAB      Ectopic      Multiple      Live Births             Past Medical History:  Diagnosis Date  . 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 Migraines   . 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 Seasonal allergies   . Wears contact lenses    Past Surgical History:  Procedure Laterality Date  . CHOLECYSTECTOMY  11/2003  . CHROMOPERTUBATION N/A 05/08/2018   Procedure: CHROMOPERTUBATION;  Surgeon: Dian Queen, MD;  Location: [facility];  Service: Gynecology;  Laterality: N/A;  . IMAGE GUIDED SINUS SURGERY N/A 09/07/2016   Procedure: IMAGE GUIDED SINUS SURGERY;  Surgeon: Beverly Gust, MD;  Location: [place];  Service: ENT;  Laterality: N/A;  STYKER GAVE DISK TO CECE 12-20 kp  . KNEE ARTHROSCOPY Left 2000  . KNEE ARTHROSCOPY WITH MEDIAL MENISECTOMY Right 10/10/2014   Procedure: RIGHT KNEE ARTHROSCOPY; RESECTION PLICA; CHONDROPLASTY, AND PARTIAL MEDIAL MENISCECTOMY;  Surgeon: Ninetta Lights, MD;  Location: [place];  Service: Orthopedics;  Laterality: Right;  . LAPAROSCOPY N/A 05/08/2018   Procedure: LAPAROSCOPY DIAGNOSTIC;  Surgeon: Dian Queen, MD;  Location: [facility];  Service: Gynecology;  Laterality: N/A;  fulgeration on endometriosis  . MAXILLARY ANTROSTOMY Bilateral 09/07/2016   Procedure: MAXILLARY ANTROSTOMY WITH TISSUE REMOVAL;  Surgeon: Beverly Gust, MD;  Location: [place];  Service: ENT;  Laterality: Bilateral;  . NASAL TURBINATE REDUCTION Bilateral 09/07/2016   Procedure: TURBINATE REDUCTION/SUBMUCOSAL RESECTION;  Surgeon: Beverly Gust, MD;  Location: [place];  Service: ENT;  Laterality: Bilateral;  . SEPTOPLASTY N/A 09/07/2016   Procedure: SEPTOPLASTY;  Surgeon: Beverly Gust, MD;  Location: [place];  Service: ENT;  Laterality: N/A;  . TONSILLECTOMY AND ADENOIDECTOMY    . WISDOM TOOTH EXTRACTION     Family History: family history includes Arthritis in her father, mother, and paternal grandmother; Asthma in her father and sister; Hyperlipidemia in her mother; Hypertension in her father and mother; Irritable bowel syndrome in her mother; Ovarian cancer in her mother. Social History:  reports that she has never smoked. She has never used smokeless tobacco. She reports previous alcohol use. She reports that she does not use drugs.     Maternal Diabetes: No Genetic Screening: Declined Maternal Ultrasounds/Referrals: Normal Fetal Ultrasounds or other Referrals:  None Maternal Substance Abuse:  No Significant Maternal Medications:  None Significant Maternal Lab Results:  Group B Strep positive Other Comments:  None  ROS History Dilation: 4 Effacement (%): 80 Blood pressure 125/78, pulse 89, temperature 98.1 F (36.7 C), temperature source Oral, resp. rate 18, height 5' 6.5" (1.689 m), weight 134.5 kg, last menstrual period 06/22/2018, SpO2 97 %. Exam Physical Exam  Prenatal labs: ABO, Rh: --/--/A NEG (06/27 0930) Antibody: POS (06/27 0930) Rubella:   RPR:    HBsAg:    HIV:    GBS: Positive (12/05 0000)   Assessment/Plan: Admit  Vancomycin for GBS Pitocin augmentation Epidural prn   Zelphia CairoGretchen Kebin Maye 03/10/2019, 11:17 AM

## 2019-03-10 NOTE — MAU Note (Signed)
Bedside US performed by CNM to confirm presentation.  Vertex presentation.

## 2019-03-10 NOTE — Progress Notes (Signed)
Called to room - pt pushing with good effort but variables getting deeper with slow return to baseline.  Recommended vacuum assist and pt agrees.   Vacuum applied and infant delivered with next push No pop offs Cord clamped and cut and infant taken to warmer for evaluation - Apgars 7,8 Placenta did not deliver with traction, fundal massage, and pitocin Manual extraction performed 3rd degree laceration repaired with vicryl rapide.   Fundus firm.  EBL 250cc

## 2019-03-10 NOTE — Anesthesia Preprocedure Evaluation (Signed)
Anesthesia Evaluation  Patient identified by MRN, date of birth, ID band Patient awake    Reviewed: Allergy & Precautions, H&P , NPO status , Patient's Chart, lab work & pertinent test results  History of Anesthesia Complications Negative for: history of anesthetic complications  Airway Mallampati: II  TM Distance: >3 FB Neck ROM: full    Dental no notable dental hx.    Pulmonary neg pulmonary ROS,    Pulmonary exam normal        Cardiovascular negative cardio ROS Normal cardiovascular exam Rhythm:regular Rate:Normal     Neuro/Psych  Headaches, negative psych ROS   GI/Hepatic negative GI ROS, Neg liver ROS,   Endo/Other  Morbid obesity  Renal/GU negative Renal ROS  negative genitourinary   Musculoskeletal   Abdominal   Peds  Hematology negative hematology ROS (+)   Anesthesia Other Findings   Reproductive/Obstetrics (+) Pregnancy                             Anesthesia Physical Anesthesia Plan  ASA: III  Anesthesia Plan: Epidural   Post-op Pain Management:    Induction:   PONV Risk Score and Plan:   Airway Management Planned:   Additional Equipment:   Intra-op Plan:   Post-operative Plan:   Informed Consent: I have reviewed the patients History and Physical, chart, labs and discussed the procedure including the risks, benefits and alternatives for the proposed anesthesia with the patient or authorized representative who has indicated his/her understanding and acceptance.       Plan Discussed with:   Anesthesia Plan Comments:         Anesthesia Quick Evaluation

## 2019-03-10 NOTE — Anesthesia Procedure Notes (Signed)
Epidural Patient location during procedure: OB Start time: 03/10/2019 1:12 PM End time: 03/10/2019 1:23 PM  Staffing Anesthesiologist: Lidia Collum, MD Performed: anesthesiologist   Preanesthetic Checklist Completed: patient identified, pre-op evaluation, timeout performed, IV checked, risks and benefits discussed and monitors and equipment checked  Epidural Patient position: sitting Prep: DuraPrep Patient monitoring: heart rate, continuous pulse ox and blood pressure Approach: midline Location: L3-L4 Injection technique: LOR air  Needle:  Needle type: Tuohy  Needle gauge: 17 G Needle length: 9 cm Needle insertion depth: 6 cm Catheter type: closed end flexible Catheter size: 19 Gauge Catheter at skin depth: 11 cm Test dose: negative  Assessment Events: blood not aspirated, injection not painful, no injection resistance, negative IV test and no paresthesia  Additional Notes Reason for block:procedure for pain

## 2019-03-10 NOTE — Progress Notes (Signed)
Vtx confirmed by bedside US 

## 2019-03-10 NOTE — MAU Note (Signed)
Mikayla Briggs is a 38 y.o. at [redacted]w[redacted]d here in MAU reporting: Around 44 had a gush of water, thinks it was clear with some blood. Has been wearing a panty liner, states she is on her 2nd one today. Started having lower abdominal pain, thinks they are about every 10 min. Also having some lower back pain. Was 2/80% on Wednesday. + FM  Onset of complaint: 0545  Pain score: contraction 4/10, back 5/10  Vitals:   03/10/19 0737  BP: 139/86  Pulse: 95  Resp: 18  Temp: 98.1 F (36.7 C)  SpO2: 98%     FHT: +FM  Lab orders placed from triage: none

## 2019-03-11 LAB — CBC
HCT: 29 % — ABNORMAL LOW (ref 36.0–46.0)
Hemoglobin: 9.6 g/dL — ABNORMAL LOW (ref 12.0–15.0)
MCH: 30.1 pg (ref 26.0–34.0)
MCHC: 33.1 g/dL (ref 30.0–36.0)
MCV: 90.9 fL (ref 80.0–100.0)
Platelets: 153 10*3/uL (ref 150–400)
RBC: 3.19 MIL/uL — ABNORMAL LOW (ref 3.87–5.11)
RDW: 14 % (ref 11.5–15.5)
WBC: 17.2 10*3/uL — ABNORMAL HIGH (ref 4.0–10.5)
nRBC: 0 % (ref 0.0–0.2)

## 2019-03-11 LAB — BIRTH TISSUE RECOVERY COLLECTION (PLACENTA DONATION)

## 2019-03-11 NOTE — Progress Notes (Signed)
Post Partum Day 1 Subjective: no complaints  Objective: Blood pressure 124/78, pulse 75, temperature 98.4 F (36.9 C), temperature source Oral, resp. rate 18, height 5' 6.5" (1.689 m), weight 134.5 kg, last menstrual period 06/22/2018, SpO2 97 %, unknown if currently breastfeeding.  Physical Exam:  General: alert and cooperative Lochia: appropriate Uterine Fundus: firm Incision: n/a DVT Evaluation: No evidence of DVT seen on physical exam.  Recent Labs    03/10/19 0929 03/11/19 0501  HGB 11.7* 9.6*  HCT 35.5* 29.0*    Assessment/Plan: Plan for discharge tomorrow   Pt desirous of BTL - rec alternative contraception vs interval tubal/vasectomy b/c her Hgb <10 this am.  Pt understands and will d/w MD at her PP appt  Pt desires circ, will schedule today  LOS: 1 day   Mikayla Briggs 03/11/2019, 7:28 AM

## 2019-03-11 NOTE — Lactation Note (Signed)
This note was copied from a baby's chart. Lactation Consultation Note  Patient Name: Boy Germain OsgoodLauren Thompson-Brown ZOXWR'UToday's Date: 03/11/2019 Reason for consult: Follow-up assessment;Early term 37-38.6wks;Primapara;1st time breastfeeding  P1 mother whose infant is now 5720 hours old.  This is an ETI at 37+2 weeks. Baby was circumcised this morning.  Baby was sleeping in mother's arms when I arrived.  She is concerned that he has not been showing an interest in feeding.  Reminded parents that baby is only 20 hours old at the present time.  He has also been circumcised and this can make him sleepy.  Reassured mother that this is "typical" behavior for any baby at 20 hours of age, and, particularly important to realize that he is sleepy from his circumcision.    Discussed hand expression and had mother do a return demonstration.  She was able to obtain a few drops of colostrum from the left breast.  Demonstrated finger feeding.  Baby was somewhat uncoordinated initially with his suck but improved as drops were fed and suck training was performed.    Mother's breasts are wide spaced, soft and non tender and nipples are short shafted and intact.  The left nipple is dimpled.  Mother is uncertain as to whether or not she desires to continue breast feeding due to her worries that he is not feeding.  Again, reassured her that this is typical and to allow us to assist her with breast feeding.  When questioned about her long term plan she stated that "I want to try."  Provided emotional support.  Attempted to latch in the football hold on the right breast without success.  Baby remained sleepy and showed no interest.  Swaddled him for father to hold.  Encouraged her to feed 8-12 times/24 hours or sooner if baby shows cues.  Reviewed feeding cues.  Mother will do hand expression before/after feedings to help encourage milk supply.  Colostrum container provided and milk storage times reviewed.  Finger feeding demonstrated.   Mother also has a spoon at bedside to use as needed.  Suggested mother pump with the DEBP after every feeding attempt.  Mother stated that the person who set her up started it  and it hurt "really bad" and she did not want to pump.  She has not pumped since it was initially set up.  I explained to her that I believed we could find a pump setting that was comfortable.  Explained the benefits of pumping to help increase milk supply.  Asked if mother would allow me  to assist with the pump and she agreed.  #24 flange size is appropriate at this time.  Regulated the setting to a comfortable level for her and mother was appreciative.  She will save all EBM and feed it back to baby.    Father present and supportive.    Encouraged her to ask her RN/LC for latch assistance as needed.  I reminded her that baby may awaken more tonight due to sleeping after the circumcision.   Maternal Data Formula Feeding for Exclusion: No Has patient been taught Hand Expression?: Yes Does the patient have breastfeeding experience prior to this delivery?: No  Feeding Feeding Type: Breast Fed  LATCH Score Latch: Too sleepy or reluctant, no latch achieved, no sucking elicited.  Audible Swallowing: None  Type of Nipple: Everted at rest and after stimulation(short shafted)  Comfort (Breast/Nipple): Soft / non-tender  Hold (Positioning): Assistance needed to correctly position infant at breast and maintain latch.  LATCH Score: 5  Interventions Interventions: Breast feeding basics reviewed;Assisted with latch;Skin to skin;Breast massage;Hand express;Breast compression;Position options;Support pillows  Lactation Tools Discussed/Used Tools: Pump Breast pump type: Double-Electric Breast Pump WIC Program: No Pump Review: Setup, frequency, and cleaning;Milk Storage(Reviewed)   Consult Status Consult Status: Follow-up Date: 03/12/19 Follow-up type: In-patient    Little Ishikawa 03/11/2019, 3:37 PM

## 2019-03-11 NOTE — Anesthesia Postprocedure Evaluation (Signed)
Anesthesia Post Note  Patient: Mikayla Briggs  Procedure(s) Performed: AN AD HOC LABOR EPIDURAL     Patient location during evaluation: Mother Baby Anesthesia Type: Epidural Level of consciousness: awake and alert, oriented and patient cooperative Pain management: pain level controlled Vital Signs Assessment: post-procedure vital signs reviewed and stable Respiratory status: spontaneous breathing Cardiovascular status: stable Postop Assessment: no headache, epidural receding, patient able to bend at knees and no signs of nausea or vomiting Anesthetic complications: no Comments: Pt. Interviewed via phone consultation d/t COVID 19 precautions.  Pt. States she is able to walk.  Pain score 0.     Last Vitals:  Vitals:   03/11/19 0200 03/11/19 0609  BP: 122/80 124/78  Pulse: 82 75  Resp: 18 18  Temp: 37 C 36.9 C  SpO2: 97% 97%    Last Pain:  Vitals:   03/11/19 0609  TempSrc: Oral  PainSc: 0-No pain   Pain Goal:                   [facility]

## 2019-03-11 NOTE — Lactation Note (Signed)
This note was copied from a baby's chart. Lactation Consultation Note  Patient Name: Mikayla Briggs [id number] Date: 03/11/2019 Reason for consult: 1st time breastfeeding;Early term 37-38.6wks P1, 31 hour female infant, cephalohematoma, mom is of advance maternal age. Infant had one void and one stool since delivery. Per parents, infant latched during L&D and mom made two attempts but infant is not latching to breast currently  ,  Infant had 3 episodes of emesis since delivery in room.  Mom attempted to latch infant on right breast using football hold, infant only holds breast in mouth and is not suckling at this time. Mom will continue to try latching infant to breast, mom knows to breastfeed according hunger cues, 8 to 12 times within 24 hours and on demand. Mom knows to call Nurse or [place] if she has further questions, concerns or need assistance latching infant to breast. Mom taught back hand expression and a  smear of colostrum is present at this time. LC noticed mom has psuedo-inverted nipples ( dimpling) of both breast. Mom will pump every 3 hours for 15 minutes on initial setting. Mom shown how to use DEBP & how to disassemble, clean, & reassemble parts. LC discussed I & O. Dad was doing STS when [facility] left room. Mom made aware of O/P services, breastfeeding support groups, community resources, and our phone # for post-discharge questions.  Maternal Data Formula Feeding for Exclusion: No Has patient been taught Hand Expression?: Yes(colostrum present in breast .) Does the patient have breastfeeding experience prior to this delivery?: No  Feeding Feeding Type: Breast Fed  LATCH Score Latch: Too sleepy or reluctant, no latch achieved, no sucking elicited.  Audible Swallowing: None  Type of Nipple: Inverted  Comfort (Breast/Nipple): Soft / non-tender  Hold (Positioning): Assistance needed to correctly position infant at breast and maintain latch.  LATCH Score:  3  Interventions Interventions: Breast feeding basics reviewed;Breast compression;Adjust position;Assisted with latch;Skin to skin;Support pillows;DEBP;Position options;Breast massage;Hand express;Expressed milk  Lactation Tools Discussed/Used Tools: Pump WIC Program: No Pump Review: Setup, frequency, and cleaning;Milk Storage Initiated by:: Vicente Serene, IBCLC Date initiated:: 03/11/19   Consult Status Consult Status: Follow-up Date: 03/11/19 Follow-up type: In-patient    Vicente Serene 03/11/2019, 5:59 AM

## 2019-03-12 ENCOUNTER — Ambulatory Visit: Payer: Self-pay

## 2019-03-12 NOTE — Lactation Note (Signed)
This note was copied from a baby's chart. Lactation Consultation Note:  Mother reports that she plans to continue to try and breastfeed at home but mostly pump.  Mother reports that she has a DEBP Medela at home.  Discussed importance of regular pumping at least every 2-3 hours and once during the night.  Encouraged mother to do good breast massage and continue to hand express before and after pumping.   Discussed treatment and prevention of engorgement.  Discussed ETI behaviors . Support and encouragement given .  Mother advised to follow up with OP services if continues to have difficulty with latching infant.  Mother is aware of availble LC services at [facility]  Patient Name: Mikayla Briggs [id number] Date: 03/12/2019 Reason for consult: Follow-up assessment   Maternal Data    Feeding Feeding Type: Formula  LATCH Score                   Interventions Interventions: Expressed milk;Hand pump;DEBP  Lactation Tools Discussed/Used     Consult Status Consult Status: Complete    Darla Lesches 03/12/2019, 11:48 AM

## 2019-03-12 NOTE — Lactation Note (Signed)
This note was copied from a baby's chart. Lactation Consultation Note Baby 73 hrs old. Mom states baby will only latch to her breast a minute then go to sleep. Will not BF. Mom is giving formula w/slow flow nipple. Mom has been frustrated.  Mom has "V" shaped breast w/everted short shaft nipple. Taught "C" hold. Taught application of #91 NS. Mom demonstrated application. Hand expressed 1 ml colostrum. Inserted into NS w/curve tip syring. Mom states she will probably pump and bottle feed. Mom stated she will cont. To try to latch w/NS but will pump if baby doesn't soon take well to breast. LC encouraged mom not to get frustrated and praised mom for the hard work in BF. Encouraged to call for assistance in latching w/NS if needed.  Patient Name: Mikayla Briggs [id number] Date: 03/12/2019 Reason for consult: Follow-up assessment;Difficult latch;1st time breastfeeding;Early term 37-38.6wks   Maternal Data Has patient been taught Hand Expression?: Yes Does the patient have breastfeeding experience prior to this delivery?: No  Feeding Feeding Type: Breast Milk Nipple Type: Slow - flow  LATCH Score Latch: Repeated attempts needed to sustain latch, nipple held in mouth throughout feeding, stimulation needed to elicit sucking reflex.  Audible Swallowing: None  Type of Nipple: Everted at rest and after stimulation  Comfort (Breast/Nipple): Soft / non-tender  Hold (Positioning): Full assist, staff holds infant at breast  LATCH Score: 5  Interventions Interventions: Breast feeding basics reviewed;Adjust position;DEBP;Assisted with latch;Support pillows;Skin to skin;Position options;Breast massage;Expressed milk;Hand express;Pre-pump if needed;Breast compression  Lactation Tools Discussed/Used Tools: Pump;Nipple Shields Nipple shield size: 24 Breast pump type: Double-Electric Breast Pump Pump Review: Milk Storage   Consult Status Consult Status: Follow-up Date:  03/12/19 Follow-up type: In-patient    Mechell Girgis, Elta Guadeloupe 03/12/2019, 7:10 AM

## 2019-03-12 NOTE — Discharge Summary (Signed)
Obstetric Discharge Summary Reason for Admission: rupture of membranes Prenatal Procedures: none Intrapartum Procedures: vacuum Postpartum Procedures: none Complications-Operative and Postpartum: 3rd degree perineal laceration Hemoglobin  Date Value Ref Range Status  03/11/2019 9.6 (L) 12.0 - 15.0 g/dL Final   HGB  Date Value Ref Range Status  01/02/2014 14.5 12.0 - 16.0 g/dL Final   HCT  Date Value Ref Range Status  03/11/2019 29.0 (L) 36.0 - 46.0 % Final  01/02/2014 43.6 35.0 - 47.0 % Final    Physical Exam:  General: alert, cooperative and appears stated age 18: appropriate Uterine Fundus: firm Incision: healing well, no significant drainage, no dehiscence, no significant erythema DVT Evaluation: No evidence of DVT seen on physical exam. Negative Homan's sign. No cords or calf tenderness. No significant calf/ankle edema.  Discharge Diagnoses: Term Pregnancy-delivered  Discharge Information: Date: 03/12/2019 Activity: pelvic rest Diet: routine Medications: None Condition: stable Instructions: refer to practice specific booklet Discharge to: home   Newborn Data: Live born female  Birth Weight: 7 lb 13.2 oz (3549 g) APGAR: 7, 8  Newborn Delivery   Birth date/time: 03/10/2019 18:31:00 Delivery type: Vaginal, Vacuum (Extractor)      Home with mother.  Colin Benton Watson Robarge 03/12/2019, 8:24 AM

## 2019-03-13 LAB — TYPE AND SCREEN
ABO/RH(D): A NEG
Antibody Screen: POSITIVE
Unit division: 0
Unit division: 0

## 2019-03-13 LAB — BPAM RBC
Blood Product Expiration Date: 202007292359
Blood Product Expiration Date: 202007292359
Unit Type and Rh: 600
Unit Type and Rh: 600

## 2019-03-19 LAB — RPR: RPR Ser Ql: NONREACTIVE

## 2019-04-23 ENCOUNTER — Encounter (HOSPITAL_BASED_OUTPATIENT_CLINIC_OR_DEPARTMENT_OTHER): Payer: Self-pay | Admitting: *Deleted

## 2019-04-23 ENCOUNTER — Other Ambulatory Visit (HOSPITAL_COMMUNITY)
Admission: RE | Admit: 2019-04-23 | Discharge: 2019-04-23 | Disposition: A | Discharge status: Home / Self Care | Payer: PRIVATE HEALTH INSURANCE | Source: Ambulatory Visit | Attending: Obstetrics and Gynecology | Admitting: Obstetrics and Gynecology

## 2019-04-23 ENCOUNTER — Other Ambulatory Visit: Payer: Self-pay

## 2019-04-23 DIAGNOSIS — Z01812 Encounter for preprocedural laboratory examination: Secondary | ICD-10-CM | POA: Insufficient documentation

## 2019-04-23 DIAGNOSIS — Z20828 Contact with and (suspected) exposure to other viral communicable diseases: Secondary | ICD-10-CM | POA: Insufficient documentation

## 2019-04-23 LAB — SARS CORONAVIRUS 2 (TAT 6-24 HRS): SARS Coronavirus 2: NEGATIVE

## 2019-04-23 NOTE — Progress Notes (Signed)
Spoke with patient via telephone for pre op interview. NPO after MN. Patient can take birth control pill and Zyrtec AM of surgery with a sip of water. Will need UPT AM of surgery. Arrival time 0530.

## 2019-04-25 NOTE — Anesthesia Preprocedure Evaluation (Addendum)
Anesthesia Evaluation  Patient identified by MRN, date of birth, ID band Patient awake    Reviewed: Allergy & Precautions, NPO status , Patient's Chart, lab work & pertinent test results  Airway Mallampati: III       Dental no notable dental hx. (+) Teeth Intact   Pulmonary neg pulmonary ROS,    Pulmonary exam normal breath sounds clear to auscultation       Cardiovascular Exercise Tolerance: Good negative cardio ROS Normal cardiovascular exam Rhythm:Regular Rate:Normal     Neuro/Psych  Headaches, negative psych ROS   GI/Hepatic negative GI ROS, Neg liver ROS,   Endo/Other  Morbid obesity  Renal/GU negative Renal ROS  negative genitourinary   Musculoskeletal   Abdominal (+) + obese,   Peds  Hematology negative hematology ROS (+)   Anesthesia Other Findings   Reproductive/Obstetrics negative OB ROS Endometriosis                             Anesthesia Physical  Anesthesia Plan  ASA: III  Anesthesia Plan: General   Post-op Pain Management:    Induction: Intravenous  PONV Risk Score and Plan: 4 or greater and Scopolamine patch - Pre-op, Midazolam, Ondansetron and Dexamethasone  Airway Management Planned: Oral ETT  Additional Equipment: None  Intra-op Plan:   Post-operative Plan: Extubation in OR  Informed Consent: I have reviewed the patients History and Physical, chart, labs and discussed the procedure including the risks, benefits and alternatives for the proposed anesthesia with the patient or authorized representative who has indicated his/her understanding and acceptance.     Dental advisory given  Plan Discussed with: CRNA  Anesthesia Plan Comments:        Anesthesia Quick Evaluation

## 2019-04-26 ENCOUNTER — Ambulatory Visit (HOSPITAL_BASED_OUTPATIENT_CLINIC_OR_DEPARTMENT_OTHER): Payer: PRIVATE HEALTH INSURANCE | Admitting: Anesthesiology

## 2019-04-26 ENCOUNTER — Other Ambulatory Visit: Payer: Self-pay

## 2019-04-26 ENCOUNTER — Encounter (HOSPITAL_BASED_OUTPATIENT_CLINIC_OR_DEPARTMENT_OTHER): Payer: Self-pay | Admitting: *Deleted

## 2019-04-26 ENCOUNTER — Encounter (HOSPITAL_BASED_OUTPATIENT_CLINIC_OR_DEPARTMENT_OTHER)
Admission: RE | Disposition: A | Discharge status: Home / Self Care | Payer: Self-pay | Source: Home / Self Care | Attending: Obstetrics and Gynecology

## 2019-04-26 ENCOUNTER — Ambulatory Visit (HOSPITAL_BASED_OUTPATIENT_CLINIC_OR_DEPARTMENT_OTHER)
Admission: RE | Admit: 2019-04-26 | Discharge: 2019-04-26 | Disposition: A | Discharge status: Home / Self Care | Payer: PRIVATE HEALTH INSURANCE | Attending: Obstetrics and Gynecology | Admitting: Obstetrics and Gynecology

## 2019-04-26 DIAGNOSIS — G43909 Migraine, unspecified, not intractable, without status migrainosus: Secondary | ICD-10-CM | POA: Insufficient documentation

## 2019-04-26 DIAGNOSIS — Z79899 Other long term (current) drug therapy: Secondary | ICD-10-CM | POA: Insufficient documentation

## 2019-04-26 DIAGNOSIS — Z302 Encounter for sterilization: Secondary | ICD-10-CM | POA: Diagnosis present

## 2019-04-26 DIAGNOSIS — Z793 Long term (current) use of hormonal contraceptives: Secondary | ICD-10-CM | POA: Diagnosis not present

## 2019-04-26 DIAGNOSIS — Z6841 Body Mass Index (BMI) 40.0 and over, adult: Secondary | ICD-10-CM | POA: Diagnosis not present

## 2019-04-26 HISTORY — PX: LAPAROSCOPIC TUBAL LIGATION: SHX1937

## 2019-04-26 LAB — POCT I-STAT, CHEM 8
BUN: 7 mg/dL (ref 6–20)
Calcium, Ion: 1.29 mmol/L (ref 1.15–1.40)
Chloride: 104 mmol/L (ref 98–111)
Creatinine, Ser: 0.7 mg/dL (ref 0.44–1.00)
Glucose, Bld: 104 mg/dL — ABNORMAL HIGH (ref 70–99)
HCT: 37 % (ref 36.0–46.0)
Hemoglobin: 12.6 g/dL (ref 12.0–15.0)
Potassium: 3.7 mmol/L (ref 3.5–5.1)
Sodium: 139 mmol/L (ref 135–145)
TCO2: 24 mmol/L (ref 22–32)

## 2019-04-26 LAB — CBC
HCT: 38.8 % (ref 36.0–46.0)
Hemoglobin: 12.4 g/dL (ref 12.0–15.0)
MCH: 29 pg (ref 26.0–34.0)
MCHC: 32 g/dL (ref 30.0–36.0)
MCV: 90.7 fL (ref 80.0–100.0)
Platelets: 251 10*3/uL (ref 150–400)
RBC: 4.28 MIL/uL (ref 3.87–5.11)
RDW: 12.7 % (ref 11.5–15.5)
WBC: 9.3 10*3/uL (ref 4.0–10.5)
nRBC: 0 % (ref 0.0–0.2)

## 2019-04-26 LAB — ABO/RH: ABO/RH(D): A NEG

## 2019-04-26 LAB — TYPE AND SCREEN
ABO/RH(D): A NEG
Antibody Screen: NEGATIVE

## 2019-04-26 LAB — POCT PREGNANCY, URINE: Preg Test, Ur: NEGATIVE

## 2019-04-26 SURGERY — LIGATION, FALLOPIAN TUBE, LAPAROSCOPIC
Anesthesia: General | Site: Abdomen | Laterality: Bilateral

## 2019-04-26 MED ORDER — LACTATED RINGERS IV SOLN
INTRAVENOUS | Status: DC
  Administered 2019-04-26: 07:00:00 via INTRAVENOUS
  Filled 2019-04-26: qty 1000

## 2019-04-26 MED ORDER — OXYCODONE-ACETAMINOPHEN 7.5-325 MG PO TABS: 2.0000 | ORAL_TABLET | Freq: Four times a day (QID) | ORAL | 0 refills | Status: DC | PRN

## 2019-04-26 MED ORDER — MIDAZOLAM HCL 2 MG/2ML IJ SOLN
INTRAMUSCULAR | Status: AC
  Filled 2019-04-26: qty 2

## 2019-04-26 MED ORDER — SCOPOLAMINE 1 MG/3DAYS TD PT72
1.0000 | MEDICATED_PATCH | TRANSDERMAL | Status: DC
  Administered 2019-04-26: 1.5 mg via TRANSDERMAL
  Filled 2019-04-26: qty 1

## 2019-04-26 MED ORDER — FENTANYL CITRATE (PF) 100 MCG/2ML IJ SOLN
INTRAMUSCULAR | Status: AC
  Filled 2019-04-26: qty 2

## 2019-04-26 MED ORDER — ONDANSETRON HCL 4 MG/2ML IJ SOLN
INTRAMUSCULAR | Status: AC
  Filled 2019-04-26: qty 2

## 2019-04-26 MED ORDER — SODIUM CHLORIDE (PF) 0.9 % IJ SOLN
INTRAMUSCULAR | Status: AC
  Filled 2019-04-26: qty 50

## 2019-04-26 MED ORDER — DEXAMETHASONE SODIUM PHOSPHATE 10 MG/ML IJ SOLN
INTRAMUSCULAR | Status: DC | PRN
  Administered 2019-04-26: 10 mg via INTRAVENOUS

## 2019-04-26 MED ORDER — CLINDAMYCIN PHOSPHATE 900 MG/50ML IV SOLN
900.0000 mg | INTRAVENOUS | Status: DC
  Filled 2019-04-26: qty 50

## 2019-04-26 MED ORDER — MEPERIDINE HCL 25 MG/ML IJ SOLN
6.2500 mg | INTRAMUSCULAR | Status: DC | PRN
  Filled 2019-04-26: qty 1

## 2019-04-26 MED ORDER — SUGAMMADEX SODIUM 200 MG/2ML IV SOLN
INTRAVENOUS | Status: DC | PRN
  Administered 2019-04-26: 400 mg via INTRAVENOUS

## 2019-04-26 MED ORDER — ONDANSETRON HCL 4 MG/2ML IJ SOLN
4.0000 mg | Freq: Once | INTRAMUSCULAR | Status: DC | PRN
  Filled 2019-04-26: qty 2

## 2019-04-26 MED ORDER — PROPOFOL 10 MG/ML IV BOLUS
INTRAVENOUS | Status: DC | PRN
  Administered 2019-04-26: 250 mg via INTRAVENOUS

## 2019-04-26 MED ORDER — OXYCODONE HCL 5 MG PO TABS
5.0000 mg | ORAL_TABLET | Freq: Once | ORAL | Status: DC | PRN
  Filled 2019-04-26: qty 1

## 2019-04-26 MED ORDER — CLINDAMYCIN PHOSPHATE 900 MG/50ML IV SOLN
900.0000 mg | INTRAVENOUS | Status: AC
  Administered 2019-04-26: 900 mg via INTRAVENOUS
  Filled 2019-04-26: qty 50

## 2019-04-26 MED ORDER — ACETAMINOPHEN 500 MG PO TABS
ORAL_TABLET | ORAL | Status: AC
  Filled 2019-04-26: qty 2

## 2019-04-26 MED ORDER — BUPIVACAINE HCL (PF) 0.25 % IJ SOLN
INTRAMUSCULAR | Status: DC | PRN
  Administered 2019-04-26: 7 mL

## 2019-04-26 MED ORDER — ACETAMINOPHEN 325 MG PO TABS
325.0000 mg | ORAL_TABLET | ORAL | Status: DC | PRN
  Filled 2019-04-26: qty 2

## 2019-04-26 MED ORDER — KETOROLAC TROMETHAMINE 30 MG/ML IJ SOLN
INTRAMUSCULAR | Status: AC
  Filled 2019-04-26: qty 1

## 2019-04-26 MED ORDER — OXYCODONE-ACETAMINOPHEN 7.5-325 MG PO TABS
2.0000 | ORAL_TABLET | Freq: Four times a day (QID) | ORAL | Status: DC | PRN
  Filled 2019-04-26: qty 2

## 2019-04-26 MED ORDER — ROCURONIUM BROMIDE 10 MG/ML (PF) SYRINGE
PREFILLED_SYRINGE | INTRAVENOUS | Status: DC | PRN
  Administered 2019-04-26: 25 mg via INTRAVENOUS
  Administered 2019-04-26: 10 mg via INTRAVENOUS
  Administered 2019-04-26: 5 mg via INTRAVENOUS

## 2019-04-26 MED ORDER — GENTAMICIN SULFATE 40 MG/ML IJ SOLN
5.0000 mg/kg | INTRAVENOUS | Status: AC
  Administered 2019-04-26: 430 mg via INTRAVENOUS
  Filled 2019-04-26 (×2): qty 10.75

## 2019-04-26 MED ORDER — SCOPOLAMINE 1 MG/3DAYS TD PT72
MEDICATED_PATCH | TRANSDERMAL | Status: AC
  Filled 2019-04-26: qty 1

## 2019-04-26 MED ORDER — CLINDAMYCIN PHOSPHATE 900 MG/50ML IV SOLN
INTRAVENOUS | Status: AC
  Filled 2019-04-26: qty 50

## 2019-04-26 MED ORDER — MIDAZOLAM HCL 2 MG/2ML IJ SOLN
INTRAMUSCULAR | Status: DC | PRN
  Administered 2019-04-26: 2 mg via INTRAVENOUS

## 2019-04-26 MED ORDER — KETOROLAC TROMETHAMINE 30 MG/ML IJ SOLN
30.0000 mg | Freq: Once | INTRAMUSCULAR | Status: AC | PRN
  Administered 2019-04-26: 30 mg via INTRAVENOUS
  Filled 2019-04-26: qty 1

## 2019-04-26 MED ORDER — OXYCODONE HCL 5 MG/5ML PO SOLN
5.0000 mg | Freq: Once | ORAL | Status: DC | PRN
  Filled 2019-04-26: qty 5

## 2019-04-26 MED ORDER — FENTANYL CITRATE (PF) 100 MCG/2ML IJ SOLN
INTRAMUSCULAR | Status: DC | PRN
  Administered 2019-04-26: 100 ug via INTRAVENOUS

## 2019-04-26 MED ORDER — LIDOCAINE 2% (20 MG/ML) 5 ML SYRINGE
INTRAMUSCULAR | Status: DC | PRN
  Administered 2019-04-26: 100 mg via INTRAVENOUS

## 2019-04-26 MED ORDER — ACETAMINOPHEN 160 MG/5ML PO SOLN
325.0000 mg | ORAL | Status: DC | PRN
  Filled 2019-04-26: qty 20.3

## 2019-04-26 MED ORDER — DEXAMETHASONE SODIUM PHOSPHATE 10 MG/ML IJ SOLN
INTRAMUSCULAR | Status: AC
  Filled 2019-04-26: qty 1

## 2019-04-26 MED ORDER — ONDANSETRON HCL 4 MG/2ML IJ SOLN
INTRAMUSCULAR | Status: DC | PRN
  Administered 2019-04-26: 4 mg via INTRAVENOUS

## 2019-04-26 MED ORDER — ACETAMINOPHEN 325 MG PO TABS
ORAL_TABLET | ORAL | Status: DC | PRN
  Administered 2019-04-26: 1000 mg via ORAL

## 2019-04-26 MED ORDER — GENTAMICIN SULFATE 40 MG/ML IJ SOLN
5.0000 mg/kg | INTRAVENOUS | Status: DC
  Filled 2019-04-26: qty 15.25

## 2019-04-26 MED ORDER — FENTANYL CITRATE (PF) 100 MCG/2ML IJ SOLN
25.0000 ug | INTRAMUSCULAR | Status: DC | PRN
  Administered 2019-04-26: 09:00:00 25 ug via INTRAVENOUS
  Filled 2019-04-26: qty 1

## 2019-04-26 SURGICAL SUPPLY — 39 items
CABLE HIGH FREQUENCY MONO STRZ (ELECTRODE) IMPLANT
CANISTER SUCT 3000ML PPV (MISCELLANEOUS) IMPLANT
CATH ROBINSON RED A/P 14FR (CATHETERS) ×2 IMPLANT
COVER MAYO STAND STRL (DRAPES) IMPLANT
COVER WAND RF STERILE (DRAPES) ×2 IMPLANT
DERMABOND ADVANCED (GAUZE/BANDAGES/DRESSINGS) ×1
DERMABOND ADVANCED .7 DNX12 (GAUZE/BANDAGES/DRESSINGS) ×1 IMPLANT
DRSG COVADERM PLUS 2X2 (GAUZE/BANDAGES/DRESSINGS) ×2 IMPLANT
DRSG TELFA 3X8 NADH (GAUZE/BANDAGES/DRESSINGS) IMPLANT
DURAPREP 26ML APPLICATOR (WOUND CARE) IMPLANT
ELECT REM PT RETURN 9FT ADLT (ELECTROSURGICAL) ×2
ELECTRODE REM PT RTRN 9FT ADLT (ELECTROSURGICAL) ×1 IMPLANT
GAUZE 4X4 16PLY RFD (DISPOSABLE) ×2 IMPLANT
GLOVE BIO SURGEON STRL SZ 6.5 (GLOVE) ×2 IMPLANT
GOWN STRL REUS W/TWL LRG LVL3 (GOWN DISPOSABLE) ×2 IMPLANT
KIT TURNOVER CYSTO (KITS) ×2 IMPLANT
NEEDLE HYPO 25X1 1.5 SAFETY (NEEDLE) IMPLANT
NEEDLE INSUFFLATION 120MM (ENDOMECHANICALS) ×2 IMPLANT
NEEDLE INSUFFLATION 14GA 150MM (NEEDLE) IMPLANT
NS IRRIG 500ML POUR BTL (IV SOLUTION) ×2 IMPLANT
PACK LAPAROSCOPY BASIN (CUSTOM PROCEDURE TRAY) ×2 IMPLANT
PACK TRENDGUARD 450 HYBRID PRO (MISCELLANEOUS) ×1 IMPLANT
PAD OB MATERNITY 4.3X12.25 (PERSONAL CARE ITEMS) ×2 IMPLANT
SEALER TISSUE G2 CVD JAW 45CM (ENDOMECHANICALS) IMPLANT
SET IRRIG TUBING LAPAROSCOPIC (IRRIGATION / IRRIGATOR) IMPLANT
SPONGE GAUZE 2X2 8PLY STRL LF (GAUZE/BANDAGES/DRESSINGS) ×2 IMPLANT
STRIP CLOSURE SKIN 1/4X4 (GAUZE/BANDAGES/DRESSINGS) IMPLANT
SUT MNCRL AB 3-0 PS2 18 (SUTURE) ×2 IMPLANT
SUT VIC AB 3-0 FS2 27 (SUTURE) IMPLANT
SUT VIC AB 3-0 PS2 18 (SUTURE)
SUT VIC AB 3-0 PS2 18XBRD (SUTURE) IMPLANT
SUT VICRYL 0 UR6 27IN ABS (SUTURE) ×2 IMPLANT
TOWEL OR 17X26 10 PK STRL BLUE (TOWEL DISPOSABLE) ×4 IMPLANT
TRAY FOLEY W/BAG SLVR 14FR (SET/KITS/TRAYS/PACK) IMPLANT
TRENDGUARD 450 HYBRID PRO PACK (MISCELLANEOUS) ×2
TROCAR OPTI TIP 5M 100M (ENDOMECHANICALS) IMPLANT
TROCAR XCEL DIL TIP R 11M (ENDOMECHANICALS) ×2 IMPLANT
TUBING EVAC SMOKE HEATED PNEUM (TUBING) ×2 IMPLANT
WARMER LAPAROSCOPE (MISCELLANEOUS) ×2 IMPLANT

## 2019-04-26 NOTE — Anesthesia Postprocedure Evaluation (Signed)
Anesthesia Post Note  Patient: Mikayla Briggs  Procedure(s) Performed: LAPAROSCOPIC TUBAL LIGATION (Bilateral Abdomen)     Patient location during evaluation: Phase II Anesthesia Type: General Level of consciousness: awake and sedated Pain management: pain level controlled Vital Signs Assessment: post-procedure vital signs reviewed and stable Respiratory status: spontaneous breathing Cardiovascular status: stable Postop Assessment: no apparent nausea or vomiting Anesthetic complications: no    Last Vitals:  Vitals:   04/26/19 0838 04/26/19 0845  BP:  124/69  Pulse: 68 62  Resp: 16 10  Temp:    SpO2: 100% 96%    Last Pain:  Vitals:   04/26/19 0838  TempSrc:   PainSc: 3    Pain Goal: Patients Stated Pain Goal: 7 (04/26/19 0838)                 Huston Foley

## 2019-04-26 NOTE — Brief Op Note (Signed)
04/26/2019  8:07 AM  PATIENT:  Mikayla Briggs  38 y.o. female  PRE-OPERATIVE DIAGNOSIS:  desires sterilization  POST-OPERATIVE DIAGNOSIS:  desires sterilization  PROCEDURE:  Procedure(s): LAPAROSCOPIC TUBAL LIGATION (Bilateral)  SURGEON:  Surgeon(s) and Role:    * Dian Queen, MD - Primary  PHYSICIAN ASSISTANT:   ASSISTANTS: none   ANESTHESIA:   general  EBL:  minimal   BLOOD ADMINISTERED:none  DRAINS: none   LOCAL MEDICATIONS USED:  NONE  SPECIMEN:  No Specimen  DISPOSITION OF SPECIMEN:  N/A  COUNTS:  YES  TOURNIQUET:  * No tourniquets in log *  DICTATION: .Other Dictation: Dictation Number dictated  PLAN OF CARE: Discharge to home after PACU  PATIENT DISPOSITION:  PACU - hemodynamically stable.   Delay start of Pharmacological VTE agent (>24hrs) due to surgical blood loss or risk of bleeding: not applicable

## 2019-04-26 NOTE — Addendum Note (Signed)
Addendum  created 04/26/19 1042 by Wanita Chamberlain, CRNA   Intraprocedure Meds edited

## 2019-04-26 NOTE — Discharge Instructions (Signed)
° ° ° ° °  Post Anesthesia Home Care Instructions  Activity: Get plenty of rest for the remainder of the day. A responsible individual must stay with you for 24 hours following the procedure.  For the next 24 hours, DO NOT: -Drive a car -Paediatric nurse -Drink alcoholic beverages -Take any medication unless instructed by your physician -Make any legal decisions or sign important papers.  Meals: Start with liquid foods such as gelatin or soup. Progress to regular foods as tolerated. Avoid greasy, spicy, heavy foods. If nausea and/or vomiting occur, drink only clear liquids until the nausea and/or vomiting subsides. Call your physician if vomiting continues.  Special Instructions/Symptoms: Your throat may feel dry or sore from the anesthesia or the breathing tube placed in your throat during surgery. If this causes discomfort, gargle with warm salt water. The discomfort should disappear within 24 hours.  If you had a scopolamine patch placed behind your ear for the management of post- operative nausea and/or vomiting:  1. The medication in the patch is effective for 72 hours, after which it should be removed.  Wrap patch in a tissue and discard in the trash. Wash hands thoroughly with soap and water. 2. You may remove the patch earlier than 72 hours if you experience unpleasant side effects which may include dry mouth, dizziness or visual disturbances. 3. Avoid touching the patch. Wash your hands with soap and water after contact with the patch.      Do not take any tylenol until after 1:15 pm today Do not take any nonsteroidal anti inflammatories until after 1:30 pm today.

## 2019-04-26 NOTE — Anesthesia Procedure Notes (Signed)
Procedure Name: Intubation Date/Time: 04/26/2019 7:34 AM Performed by: Wanita Chamberlain, CRNA Pre-anesthesia Checklist: Patient identified, Emergency Drugs available, Suction available, Patient being monitored and Timeout performed Patient Re-evaluated:Patient Re-evaluated prior to induction Oxygen Delivery Method: Circle system utilized Preoxygenation: Pre-oxygenation with 100% oxygen Induction Type: IV induction Ventilation: Mask ventilation without difficulty Laryngoscope Size: Mac and 3 Grade View: Grade I Tube type: Oral Number of attempts: 1 Airway Equipment and Method: Stylet Placement Confirmation: breath sounds checked- equal and bilateral,  CO2 detector,  positive ETCO2 and ETT inserted through vocal cords under direct vision Secured at: 22 cm Tube secured with: Tape Dental Injury: Teeth and Oropharynx as per pre-operative assessment

## 2019-04-26 NOTE — H&P (Signed)
38 year old female here for [facility] BTL Postpartum  Past Medical History:  Diagnosis Date  . 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 Migraines   . 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 Seasonal allergies   . Wears contact lenses    Past Surgical History:  Procedure Laterality Date  . CHOLECYSTECTOMY  11/2003  . CHROMOPERTUBATION N/A 05/08/2018   Procedure: CHROMOPERTUBATION;  Surgeon: Dian Queen, MD;  Location: [facility];  Service: Gynecology;  Laterality: N/A;  . IMAGE GUIDED SINUS SURGERY N/A 09/07/2016   Procedure: IMAGE GUIDED SINUS SURGERY;  Surgeon: Beverly Gust, MD;  Location: [place];  Service: ENT;  Laterality: N/A;  STYKER GAVE DISK TO CECE 12-20 kp  . KNEE ARTHROSCOPY Left 2000  . KNEE ARTHROSCOPY WITH MEDIAL MENISECTOMY Right 10/10/2014   Procedure: RIGHT KNEE ARTHROSCOPY; RESECTION PLICA; CHONDROPLASTY, AND PARTIAL MEDIAL MENISCECTOMY;  Surgeon: Ninetta Lights, MD;  Location: [place];  Service: Orthopedics;  Laterality: Right;  . LAPAROSCOPY N/A 05/08/2018   Procedure: LAPAROSCOPY DIAGNOSTIC;  Surgeon: Dian Queen, MD;  Location: [facility];  Service: Gynecology;  Laterality: N/A;  fulgeration on endometriosis  . MAXILLARY ANTROSTOMY Bilateral 09/07/2016   Procedure: MAXILLARY ANTROSTOMY WITH TISSUE REMOVAL;  Surgeon: Beverly Gust, MD;  Location: [place];  Service: ENT;  Laterality: Bilateral;  . NASAL TURBINATE REDUCTION Bilateral 09/07/2016   Procedure: TURBINATE REDUCTION/SUBMUCOSAL RESECTION;  Surgeon: Beverly Gust, MD;  Location: [place];  Service: ENT;  Laterality: Bilateral;  . SEPTOPLASTY N/A 09/07/2016   Procedure: SEPTOPLASTY;  Surgeon: Beverly Gust, MD;  Location: [place];  Service: ENT;  Laterality: N/A;  . TONSILLECTOMY AND ADENOIDECTOMY    . WISDOM  TOOTH EXTRACTION     Scheduled Meds: Continuous Infusions: . clindamycin (CLEOCIN) IV     And  . gentamicin    . lactated ringers    . lactated ringers     PRN Meds: Prior to Admission medications   Medication Sig Start Date End Date Taking? Authorizing Provider  cetirizine (ZYRTEC) 10 MG tablet Take 1 tablet (10 mg total) by mouth daily. 07/21/18  Yes Baity, Coralie Keens, NP  levocetirizine (XYZAL) 5 MG tablet [place] EVENING Patient taking differently: Take 5 mg by mouth every evening. TAKE ONE TABLET BY MOUTH EACH EVENING 07/21/18  Yes Jearld Fenton, NP  levonorgestrel-ethinyl estradiol (SEASONALE) 0.15-0.03 MG tablet Take 1 tablet by mouth daily.   Yes [provider]  montelukast (SINGULAIR) 10 MG tablet Take 1 tablet (10 mg total) by mouth at bedtime. 07/21/18  Yes BaityCoralie Keens, NP  Prenatal Vit-Fe Fumarate-FA (PRENATAL MULTIVITAMIN) TABS tablet Take 1 tablet by mouth at bedtime.   Yes [provider]   BP (!) 138/92   Pulse 71   Temp 98.7 F (37.1 C) (Oral)   Resp 18   Ht 5\' 6"  (1.676 m)   Wt 123.6 kg   LMP 06/22/2018   SpO2 100%   BMI 43.98 kg/m  Results for orders placed or performed during the hospital encounter of 04/26/19 (from the past 24 hour(s))  Pregnancy, urine POC     Status: None   Collection Time: 04/26/19  5:50 AM  Result Value Ref Range   Preg Test, Ur NEGATIVE NEGATIVE  I-STAT, chem 8     Status: Abnormal   Collection Time: 04/26/19  6:55 AM  Result Value Ref Range   Sodium 139 135 - 145 mmol/L   Potassium 3.7 3.5 - 5.1 mmol/L   Chloride 104 98 - 111 mmol/L   BUN 7 6 - 20 mg/dL   Creatinine, Ser 4.090.70 0.44 - 1.00 mg/dL   Glucose, Bld 811104 (H) 70 - 99 mg/dL   Calcium, Ion 9.141.29 7.821.15 - 1.40 mmol/L   TCO2 24 22 - 32 mmol/L   Hemoglobin 12.6 12.0 - 15.0 g/dL   HCT 95.637.0 21.336.0 - 08.646.0 %   General alert and oriented Lung CTAB Car rrr Abdomen soft and non tender  IMPRESSION: Desires permanent  sterilization  PLAN: [facility] BTL Risks reviewed Consent signed

## 2019-04-26 NOTE — Transfer of Care (Signed)
Immediate Anesthesia Transfer of Care Note  Patient: Mikayla Briggs  Procedure(s) Performed: LAPAROSCOPIC TUBAL LIGATION (Bilateral Abdomen)  Patient Location: PACU  Anesthesia Type:General  Level of Consciousness: awake, alert , oriented and patient cooperative  Airway & Oxygen Therapy: Patient Spontanous Breathing and Patient connected to nasal cannula oxygen  Post-op Assessment: Report given to RN and Post -op Vital signs reviewed and stable  Post vital signs: Reviewed and stable  Last Vitals:  Vitals Value Taken Time  BP    Temp    Pulse 82 04/26/19 0815  Resp    SpO2 98 % 04/26/19 0815  Vitals shown include unvalidated device data.  Last Pain:  Vitals:   04/26/19 0559  TempSrc: Oral         Complications: No apparent anesthesia complications

## 2019-04-26 NOTE — Op Note (Signed)
NAME: Mikayla, [name] [id number] ACCOUNT [id number] DATE OF BIRTH:[birth year] FACILITY: [facility] LOCATION: [facility] PHYSICIAN:Shaunessy Dobratz Lynett Fish, MD  OPERATIVE REPORT  DATE OF PROCEDURE:  04/26/2019  PREOPERATIVE DIAGNOSIS:  Desires permanent sterilization.  POSTOPERATIVE DIAGNOSIS:  Desires permanent sterilization.  PROCEDURE:  Diagnostic laparoscopy and laparoscopic bilateral tubal ligation.  SURGEON:  Dian Queen, MD  ANESTHESIA:  General.  ESTIMATED BLOOD LOSS:  Minimal.  COMPLICATIONS:  None.  DRAINS:  None.  DESCRIPTION OF PROCEDURE:  The patient was taken to the operating room after she had been counseled about the risk of the surgery.  She was intubated without difficulty.  She was prepped and draped in the usual sterile fashion.  In and out catheter was  used to empty the bladder.  The uterine manipulator was inserted.  Attention was turned to the abdomen where a small incision was made in the umbilicus.  The Veress needle was inserted one time.  Pneumoperitoneum was performed.  The Veress needle was  removed and an 11 mm trocar was introduced.  The trocar was placed easily.  The scope was introduced and no area of intestinal or abdominal injury was noted.  The patient was gently placed in Trendelenburg and using the Kleppinger, we then inserted the  long Kleppinger and performed the standard bilateral tubal ligation by burning the middle portion of each fallopian tube after identifying each fimbriated end.  We used a triple burn technique.  I also used this opportunity to inspect the pelvis.  There  were some areas where there had been some previous endometriosis.  No active endometriosis noted.  The pelvis appeared to be normal.  No adhesions noted.  The trocar was removed after the gas was released.  All instruments were removed from the vagina.   The incision was closed with a 3-0 Monocryl under the skin and then Dermabond for the skin  and a pressure dressing applied to the umbilicus.  All sponge, lap and instrument counts were correct x2.  The patient was extubated and went to recovery room in  stable condition.  TN/NUANCE  D:04/26/2019 T:04/26/2019 JOB:[id number]

## 2019-04-27 ENCOUNTER — Encounter (HOSPITAL_BASED_OUTPATIENT_CLINIC_OR_DEPARTMENT_OTHER): Payer: Self-pay | Admitting: Obstetrics and Gynecology

## 2019-07-20 ENCOUNTER — Other Ambulatory Visit: Payer: Self-pay | Admitting: Internal Medicine

## 2019-07-20 DIAGNOSIS — Z9109 Other allergy status, other than to drugs and biological substances: Secondary | ICD-10-CM

## 2019-07-31 ENCOUNTER — Ambulatory Visit: Payer: PRIVATE HEALTH INSURANCE | Admitting: Internal Medicine

## 2019-08-30 ENCOUNTER — Encounter: Payer: Self-pay | Admitting: Internal Medicine

## 2019-08-30 ENCOUNTER — Ambulatory Visit (INDEPENDENT_AMBULATORY_CARE_PROVIDER_SITE_OTHER): Payer: PRIVATE HEALTH INSURANCE | Admitting: Internal Medicine

## 2019-08-30 ENCOUNTER — Other Ambulatory Visit: Payer: Self-pay

## 2019-08-30 VITALS — BP 126/84 | HR 88 | Temp 97.4°F | Ht 65.33 in | Wt 254.0 lb

## 2019-08-30 DIAGNOSIS — Z0000 Encounter for general adult medical examination without abnormal findings: Secondary | ICD-10-CM

## 2019-08-30 DIAGNOSIS — R519 Headache, unspecified: Secondary | ICD-10-CM | POA: Diagnosis not present

## 2019-08-30 LAB — CBC
HCT: 40.8 % (ref 36.0–46.0)
Hemoglobin: 13.5 g/dL (ref 12.0–15.0)
MCHC: 33 g/dL (ref 30.0–36.0)
MCV: 88.1 fl (ref 78.0–100.0)
Platelets: 253 10*3/uL (ref 150.0–400.0)
RBC: 4.63 Mil/uL (ref 3.87–5.11)
RDW: 13.4 % (ref 11.5–15.5)
WBC: 7.1 10*3/uL (ref 4.0–10.5)

## 2019-08-30 LAB — COMPREHENSIVE METABOLIC PANEL
ALT: 18 U/L (ref 0–35)
AST: 19 U/L (ref 0–37)
Albumin: 4.1 g/dL (ref 3.5–5.2)
Alkaline Phosphatase: 44 U/L (ref 39–117)
BUN: 7 mg/dL (ref 6–23)
CO2: 27 mEq/L (ref 19–32)
Calcium: 9.3 mg/dL (ref 8.4–10.5)
Chloride: 104 mEq/L (ref 96–112)
Creatinine, Ser: 0.77 mg/dL (ref 0.40–1.20)
GFR: 83.8 mL/min (ref >60.00)
Glucose, Bld: 97 mg/dL (ref 70–99)
Potassium: 4.3 mEq/L (ref 3.5–5.1)
Sodium: 137 mEq/L (ref 135–145)
Total Bilirubin: 0.6 mg/dL (ref 0.2–1.2)
Total Protein: 6.8 g/dL (ref 6.0–8.3)

## 2019-08-30 LAB — LIPID PANEL
Cholesterol: 191 mg/dL (ref 0–200)
HDL: 39.4 mg/dL (ref >39.00)
LDL Cholesterol: 126 mg/dL — ABNORMAL HIGH (ref 0–99)
NonHDL: 152.08
Total CHOL/HDL Ratio: 5
Triglycerides: 132 mg/dL (ref 0.0–149.0)
VLDL: 26.4 mg/dL (ref 0.0–40.0)

## 2019-08-30 LAB — HEMOGLOBIN A1C: Hgb A1c MFr Bld: 5.4 % (ref 4.6–6.5)

## 2019-08-30 NOTE — Progress Notes (Signed)
Subjective:    Patient ID: Mikayla Briggs, female    DOB: [birth year], 38 y.o.   MRN: [id number]  HPI  Pt presents to the clinic today for her annual exam.   Frequent Headaches: These occur 2-3 times a month. She takes Tylenol or Fioricet as needed with good relief of symptoms.  Flu: 06/2019 Tetanus: 10/2013 Pap Smear: 04/2019 Dentist: biannually  Diet: She does eat meat. She consumes fruits and veggies daily. She does eat some fried foods. She drinks mostly water, soda. Exercise: None  Review of Systems  Past Medical History:  Diagnosis Date  . 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 Migraines   . 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 Seasonal allergies   . Wears contact lenses     Current Outpatient Medications  Medication Sig Dispense Refill  . cetirizine (ZYRTEC) 10 MG tablet Take 1 tablet (10 mg total) by mouth daily. 90 tablet 3  . levocetirizine (XYZAL) 5 MG tablet TAKE 1 TABLET BY MOUTH EACH EVENING 90 tablet 0  . levonorgestrel-ethinyl estradiol (SEASONALE) 0.15-0.03 MG tablet Take 1 tablet by mouth daily.    . montelukast (SINGULAIR) 10 MG tablet TAKE 1 TABLET BY MOUTH AT BEDTIME 90 tablet 0  . oxyCODONE-acetaminophen (PERCOCET) 7.5-325 MG tablet Take 2 tablets by mouth every 6 (six) hours as needed for severe pain. 30 tablet 0  . Prenatal Vit-Fe Fumarate-FA (PRENATAL MULTIVITAMIN) TABS tablet Take 1 tablet by mouth at bedtime.     No current facility-administered medications for this visit.    Allergies  Allergen Reactions  . Iodine Itching and Swelling  . 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   . Omeprazole Anaphylaxis and Shortness Of Breath  . Penicillins Anaphylaxis and Shortness Of Breath    Has patient had a PCN reaction causing  immediate rash, facial/tongue/throat swelling, SOB or lightheadedness with hypotension: Yes Has patient had a PCN reaction causing severe rash involving mucus membranes or skin necrosis: No Has patient had a PCN reaction that required hospitalization: Unknown Has patient had a PCN reaction occurring within the last 10 years: No If all of the above answers are "NO", then may proceed with Cephalosporin use.   . Shellfish Allergy Anaphylaxis and Shortness Of Breath  . Soap Itching    ANYTHING WITH FRAGRANCE    Family History  Problem Relation Age of Onset  . Arthritis Mother   . Ovarian cancer Mother   . Hyperlipidemia Mother   . Hypertension Mother   . Irritable bowel syndrome Mother   . Arthritis Father   . Hypertension Father   . Asthma Father   . Asthma Sister   . Arthritis Paternal Grandmother     Social History   Socioeconomic History  . Marital status: Married    Spouse name: Derryl HarborLonnie  . Number of children: 0  . Years of education: Not on file  . Highest education level: Not on file  Occupational History  . Occupation: CNA/ CPT  Tobacco Use  . Smoking status: Never Smoker  . Smokeless tobacco: Never Used  Substance and Sexual Activity  . Alcohol use: Not Currently    Alcohol/week: 0.0 standard drinks    Comment: occasionally  . Drug use: No  . Sexual activity: Yes  Birth control/protection: Pill  Other Topics Concern  . Not on file  Social History Narrative  . Not on file   Social Determinants of Health   Financial Resource Strain:   . Difficulty of Paying Living Expenses: Not on file  Food Insecurity:   . Worried About Charity fundraiser in the Last Year: Not on file  . Ran Out of Food in the Last Year: Not on file  Transportation Needs:   . Lack of Transportation (Medical): Not on file  . Lack of Transportation (Non-Medical): Not on file  Physical Activity:   . Days of Exercise per Week: Not on file  . Minutes of Exercise per Session: Not on file    Stress:   . Feeling of Stress : Not on file  Social Connections:   . Frequency of Communication with Friends and Family: Not on file  . Frequency of Social Gatherings with Friends and Family: Not on file  . Attends Religious Services: Not on file  . Active Member of Clubs or Organizations: Not on file  . Attends Archivist Meetings: Not on file  . Marital Status: Not on file  Intimate Partner Violence:   . Fear of Current or Ex-Partner: Not on file  . Emotionally Abused: Not on file  . Physically Abused: Not on file  . Sexually Abused: Not on file     Constitutional: Pt reports intermittent headaches. Denies fever, malaise, fatigue, or abrupt weight changes.  HEENT: Denies eye pain, eye redness, ear pain, ringing in the ears, wax buildup, runny nose, nasal congestion, bloody nose, or sore throat. Respiratory: Denies difficulty breathing, shortness of breath, cough or sputum production.   Cardiovascular: Denies chest pain, chest tightness, palpitations or swelling in the hands or feet.  Gastrointestinal: Denies abdominal pain, bloating, constipation, diarrhea or blood in the stool.  GU: Denies urgency, frequency, pain with urination, burning sensation, blood in urine, odor or discharge. Musculoskeletal: Denies decrease in range of motion, difficulty with gait, muscle pain or joint pain and swelling.  Skin: Denies redness, rashes, lesions or ulcercations.  Neurological: Denies dizziness, difficulty with memory, difficulty with speech or problems with balance and coordination.  Psych: Denies anxiety, depression, SI/HI.  No other specific complaints in a complete review of systems (except as listed in HPI above).     Objective:   Physical Exam  BP 126/84   Pulse 88   Temp (!) 97.4 F (36.3 C) (Temporal)   Ht 5' 5.33" (1.659 m)   Wt 254 lb (115.2 kg)   SpO2 98%   BMI 41.84 kg/m   Wt Readings from Last 3 Encounters:  04/26/19 272 lb 8 oz (123.6 kg)  03/10/19 296 lb  9.6 oz (134.5 kg)  02/24/19 288 lb 1.6 oz (130.7 kg)    General: Appears her stated age, obese, in NAD. Skin: Warm, dry and intact. No rashes noted. HEENT: Head: normal shape and size; Eyes: sclera white, no icterus, conjunctiva pink, PERRLA and EOMs intact;  Neck:  Neck supple, trachea midline. No masses, lumps or thyromegaly present.  Cardiovascular: Normal rate and rhythm. S1,S2 noted.  No murmur, rubs or gallops noted. No JVD or BLE edema.  Pulmonary/Chest: Normal effort and positive vesicular breath sounds. No respiratory distress. No wheezes, rales or ronchi noted.  Abdomen: Soft and nontender. Normal bowel sounds. No distention or masses noted. Liver, spleen and kidneys non palpable. Musculoskeletal: Strength 5/5 BUE/BLE. No difficulty with gait.  Neurological: Alert and oriented. Cranial nerves II-XII  grossly intact. Coordination normal.  Psychiatric: Mood and affect normal. Behavior is normal. Judgment and thought content normal.   BMET    Component Value Date/Time   NA 139 04/26/2019 0655   NA 137 01/02/2014 1152   K 3.7 04/26/2019 0655   K 3.9 01/02/2014 1152   CL 104 04/26/2019 0655   CL 102 01/02/2014 1152   CO2 23 02/23/2019 1741   CO2 26 01/02/2014 1152   GLUCOSE 104 (H) 04/26/2019 0655   GLUCOSE 105 (H) 01/02/2014 1152   BUN 7 04/26/2019 0655   BUN 9 01/02/2014 1152   CREATININE 0.70 04/26/2019 0655   CREATININE 0.81 01/02/2014 1152   CALCIUM 9.1 02/23/2019 1741   CALCIUM 8.8 01/02/2014 1152   GFRNONAA >60 02/23/2019 1741   GFRNONAA >60 01/02/2014 1152   GFRAA >60 02/23/2019 1741   GFRAA >60 01/02/2014 1152    Lipid Panel     Component Value Date/Time   CHOL 144 07/05/2017 1306   TRIG 113.0 07/05/2017 1306   HDL 42.00 07/05/2017 1306   CHOLHDL 3 07/05/2017 1306   VLDL 22.6 07/05/2017 1306   LDLCALC 80 07/05/2017 1306    CBC    Component Value Date/Time   WBC 9.3 04/26/2019 0650   RBC 4.28 04/26/2019 0650   HGB 12.6 04/26/2019 0655   HGB 14.5  01/02/2014 1152   HCT 37.0 04/26/2019 0655   HCT 43.6 01/02/2014 1152   PLT 251 04/26/2019 0650   PLT 280 01/02/2014 1152   MCV 90.7 04/26/2019 0650   MCV 90 01/02/2014 1152   MCH 29.0 04/26/2019 0650   MCHC 32.0 04/26/2019 0650   RDW 12.7 04/26/2019 0650   RDW 12.7 01/02/2014 1152   LYMPHSABS 2.7 07/20/2016 1101   LYMPHSABS 2.4 01/02/2014 1152   MONOABS 0.7 07/20/2016 1101   MONOABS 0.7 01/02/2014 1152   EOSABS 0.6 07/20/2016 1101   EOSABS 0.4 01/02/2014 1152   BASOSABS 0.0 07/20/2016 1101   BASOSABS 0.1 01/02/2014 1152    Hgb A1C Lab Results  Component Value Date   HGBA1C 5.6 07/20/2018         Assessment & Plan:   Preventative Health Maintenance:  Flu shot UTD Tetanus UTD Pap smear UTD Encouraged her to consume a balanced diet and exercise regimen Advised her to see a dentist annually Will check CBC, CMET, Lipid and A1C  RTC in 1 year, sooner if needed Nicki Reaper, NP This visit occurred during the SARS-CoV-2 public health emergency.  Safety protocols were in place, including screening questions prior to the visit, additional usage of staff PPE, and extensive cleaning of exam room while observing appropriate contact time as indicated for disinfecting solutions.

## 2019-08-30 NOTE — Patient Instructions (Signed)
Health Maintenance, Female Adopting a healthy lifestyle and getting preventive care are important in promoting health and wellness. Ask your health care provider about:  The right schedule for you to have regular tests and exams.  Things you can do on your own to prevent diseases and keep yourself healthy. What should I know about diet, weight, and exercise? Eat a healthy diet   Eat a diet that includes plenty of vegetables, fruits, low-fat dairy products, and lean protein.  Do not eat a lot of foods that are high in solid fats, added sugars, or sodium. Maintain a healthy weight Body mass index (BMI) is used to identify weight problems. It estimates body fat based on height and weight. Your health care provider can help determine your BMI and help you achieve or maintain a healthy weight. Get regular exercise Get regular exercise. This is one of the most important things you can do for your health. Most adults should:  Exercise for at least 150 minutes each week. The exercise should increase your heart rate and make you sweat (moderate-intensity exercise).  Do strengthening exercises at least twice a week. This is in addition to the moderate-intensity exercise.  Spend less time sitting. Even light physical activity can be beneficial. Watch cholesterol and blood lipids Have your blood tested for lipids and cholesterol at 38 years of age, then have this test every 5 years. Have your cholesterol levels checked more often if:  Your lipid or cholesterol levels are high.  You are older than 38 years of age.  You are at high risk for heart disease. What should I know about cancer screening? Depending on your health history and family history, you may need to have cancer screening at various ages. This may include screening for:  Breast cancer.  Cervical cancer.  Colorectal cancer.  Skin cancer.  Lung cancer. What should I know about heart disease, diabetes, and high blood  pressure? Blood pressure and heart disease  High blood pressure causes heart disease and increases the risk of stroke. This is more likely to develop in people who have high blood pressure readings, are of African descent, or are overweight.  Have your blood pressure checked: ? Every 3-5 years if you are 18-39 years of age. ? Every year if you are 40 years old or older. Diabetes Have regular diabetes screenings. This checks your fasting blood sugar level. Have the screening done:  Once every three years after age 40 if you are at a normal weight and have a low risk for diabetes.  More often and at a younger age if you are overweight or have a high risk for diabetes. What should I know about preventing infection? Hepatitis B If you have a higher risk for hepatitis B, you should be screened for this virus. Talk with your health care provider to find out if you are at risk for hepatitis B infection. Hepatitis C Testing is recommended for:  Everyone born from 1945 through 1965.  Anyone with known risk factors for hepatitis C. Sexually transmitted infections (STIs)  Get screened for STIs, including gonorrhea and chlamydia, if: ? You are sexually active and are younger than 38 years of age. ? You are older than 38 years of age and your health care provider tells you that you are at risk for this type of infection. ? Your sexual activity has changed since you were last screened, and you are at increased risk for chlamydia or gonorrhea. Ask your health care provider if   you are at risk.  Ask your health care provider about whether you are at high risk for HIV. Your health care provider may recommend a prescription medicine to help prevent HIV infection. If you choose to take medicine to prevent HIV, you should first get tested for HIV. You should then be tested every 3 months for as long as you are taking the medicine. Pregnancy  If you are about to stop having your period (premenopausal) and  you may become pregnant, seek counseling before you get pregnant.  Take 400 to 800 micrograms (mcg) of folic acid every day if you become pregnant.  Ask for birth control (contraception) if you want to prevent pregnancy. Osteoporosis and menopause Osteoporosis is a disease in which the bones lose minerals and strength with aging. This can result in bone fractures. If you are 65 years old or older, or if you are at risk for osteoporosis and fractures, ask your health care provider if you should:  Be screened for bone loss.  Take a calcium or vitamin D supplement to lower your risk of fractures.  Be given hormone replacement therapy (HRT) to treat symptoms of menopause. Follow these instructions at home: Lifestyle  Do not use any products that contain nicotine or tobacco, such as cigarettes, e-cigarettes, and chewing tobacco. If you need help quitting, ask your health care provider.  Do not use street drugs.  Do not share needles.  Ask your health care provider for help if you need support or information about quitting drugs. Alcohol use  Do not drink alcohol if: ? Your health care provider tells you not to drink. ? You are pregnant, may be pregnant, or are planning to become pregnant.  If you drink alcohol: ? Limit how much you use to 0-1 drink a day. ? Limit intake if you are breastfeeding.  Be aware of how much alcohol is in your drink. In the [place] one drink equals one 12 oz bottle of beer (355 mL), one 5 oz glass of wine (148 mL), or one 1 oz glass of hard liquor (44 mL). General instructions  Schedule regular health, dental, and eye exams.  Stay current with your vaccines.  Tell your health care provider if: ? You often feel depressed. ? You have ever been abused or do not feel safe at home. Summary  Adopting a healthy lifestyle and getting preventive care are important in promoting health and wellness.  Follow your health care provider's instructions about healthy  diet, exercising, and getting tested or screened for diseases.  Follow your health care provider's instructions on monitoring your cholesterol and blood pressure. This information is not intended to replace advice given to you by your health care provider. Make sure you discuss any questions you have with your health care provider. Document Released: 03/15/2011 Document Revised: 08/23/2018 Document Reviewed: 08/23/2018 Elsevier Patient Education  2020 Elsevier Inc.  

## 2019-08-30 NOTE — Assessment & Plan Note (Signed)
Continue Tylenol and Fioricet as needed Will monitor

## 2019-08-31 ENCOUNTER — Encounter: Payer: Self-pay | Admitting: Internal Medicine

## 2019-09-12 ENCOUNTER — Other Ambulatory Visit: Payer: Self-pay | Admitting: Internal Medicine

## 2019-09-12 DIAGNOSIS — Z9109 Other allergy status, other than to drugs and biological substances: Secondary | ICD-10-CM

## 2020-01-17 ENCOUNTER — Other Ambulatory Visit: Payer: Self-pay

## 2020-01-17 DIAGNOSIS — Z1231 Encounter for screening mammogram for malignant neoplasm of breast: Secondary | ICD-10-CM

## 2020-01-18 ENCOUNTER — Other Ambulatory Visit: Payer: Self-pay | Admitting: Obstetrics and Gynecology

## 2020-01-18 ENCOUNTER — Other Ambulatory Visit: Payer: Self-pay

## 2020-01-18 ENCOUNTER — Ambulatory Visit
Admission: RE | Admit: 2020-01-18 | Discharge: 2020-01-18 | Disposition: A | Discharge status: Home / Self Care | Payer: PRIVATE HEALTH INSURANCE | Source: Ambulatory Visit

## 2020-01-18 DIAGNOSIS — Z1231 Encounter for screening mammogram for malignant neoplasm of breast: Secondary | ICD-10-CM

## 2020-03-25 LAB — HM PAP SMEAR: HM Pap smear: NORMAL

## 2020-03-27 LAB — HM PAP SMEAR: HM Pap smear: NORMAL

## 2020-03-27 LAB — RESULTS CONSOLE HPV: CHL HPV: NEGATIVE

## 2020-04-15 ENCOUNTER — Other Ambulatory Visit: Payer: Self-pay | Admitting: Obstetrics and Gynecology

## 2020-04-15 DIAGNOSIS — I83813 Varicose veins of bilateral lower extremities with pain: Secondary | ICD-10-CM

## 2020-05-01 ENCOUNTER — Ambulatory Visit
Admission: RE | Admit: 2020-05-01 | Discharge: 2020-05-01 | Disposition: A | Discharge status: Home / Self Care | Payer: PRIVATE HEALTH INSURANCE | Source: Ambulatory Visit | Attending: Obstetrics and Gynecology | Admitting: Obstetrics and Gynecology

## 2020-05-01 ENCOUNTER — Ambulatory Visit
Admission: RE | Admit: 2020-05-01 | Discharge: 2020-05-01 | Disposition: A | Discharge status: Home / Self Care | Payer: Managed Care, Other (non HMO) | Source: Ambulatory Visit | Attending: Obstetrics and Gynecology | Admitting: Obstetrics and Gynecology

## 2020-05-01 DIAGNOSIS — I83813 Varicose veins of bilateral lower extremities with pain: Secondary | ICD-10-CM

## 2020-05-01 NOTE — Consult Note (Signed)
Chief Complaint:  Enlarging left lower extremity varicose vein  Referring Physician(s): Grewal,Michelle  History of Present Illness: Mikayla Briggs is a 39 y.o. female who presents for evaluation of an enlarging left lower extremity varicosity.  The varicosity begins in the lower to mid thigh and courses across the knee.  She states this is slowly enlarged over the last year or so.  She has had remote sclerotherapy in both legs for spider veins.  No prior history of DVT or superficial thrombophlebitis.  No skin lesions or ulcerations.  She does have a family history of varicose veins which affected her mother.  She is currently on birth control pills.  She has had one pregnancy and pregnancy did seem to affect the left lower extremity varicosity as it has slowly enlarged since the pregnancy.  She currently does not wear any prescription strength or over-the-counter compression stockings.  She stands for the majority of the day for approximately 10 hours.  She currently does not take any pain medicine for vein related discomfort.  Elevation does not appear to alleviate or help any of her symptoms.  Left leg is of primary concern.  Right leg is asymptomatic.  Past Medical History:  Diagnosis Date  . 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 Migraines   . 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 Seasonal allergies   . Wears contact lenses     Past Surgical History:  Procedure Laterality Date  . CHOLECYSTECTOMY  11/2003  . CHROMOPERTUBATION N/A 05/08/2018   Procedure: CHROMOPERTUBATION;  Surgeon: Marcelle Overlie, MD;  Location: [facility];  Service: Gynecology;  Laterality: N/A;  . 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 KNEE ARTHROSCOPY Left 2000  . KNEE ARTHROSCOPY WITH MEDIAL MENISECTOMY Right 10/10/2014   Procedure: RIGHT KNEE ARTHROSCOPY; RESECTION PLICA; CHONDROPLASTY, AND PARTIAL MEDIAL MENISCECTOMY;  Surgeon: Loreta Ave, MD;  Location: [facility];  Service: Orthopedics;  Laterality: Right;  . LAPAROSCOPIC TUBAL LIGATION Bilateral 04/26/2019   Procedure: LAPAROSCOPIC TUBAL LIGATION;  Surgeon: Marcelle Overlie, MD;  Location: [facility];  Service: Gynecology;  Laterality: Bilateral;  . LAPAROSCOPY N/A 05/08/2018   Procedure: LAPAROSCOPY DIAGNOSTIC;  Surgeon: Marcelle Overlie, MD;  Location: [facility];  Service: Gynecology;  Laterality: N/A;  fulgeration on endometriosis  . MAXILLARY ANTROSTOMY Bilateral 09/07/2016   Procedure: MAXILLARY ANTROSTOMY WITH TISSUE REMOVAL;  Surgeon: Linus Salmons, MD;  Location: [facility];  Service: ENT;  Laterality: Bilateral;  . NASAL TURBINATE REDUCTION Bilateral 09/07/2016   Procedure: TURBINATE REDUCTION/SUBMUCOSAL RESECTION;  Surgeon: Linus Salmons, MD;  Location: [facility];  Service: ENT;  Laterality: Bilateral;  . SEPTOPLASTY N/A 09/07/2016   Procedure: SEPTOPLASTY;  Surgeon: Linus Salmons, MD;  Location: [facility];  Service: ENT;  Laterality: N/A;  . TONSILLECTOMY AND ADENOIDECTOMY    . TUBAL LIGATION    . WISDOM TOOTH EXTRACTION      Allergies: Iodine, Ivp dye [iodinated diagnostic agents], Omeprazole, Penicillins, Shellfish allergy, and Soap  Medications: Prior to Admission medications   Medication Sig Start Date End Date Taking? Authorizing Provider  cetirizine (ZYRTEC) 10 MG tablet Take 1 tablet (10 mg total) by mouth daily. 07/21/18  Lorre Munroe, NP  levocetirizine (XYZAL) 5 MG tablet TAKE 1 TABLET BY MOUTH EACH EVENING 09/12/19   Lorre Munroe, NP  levonorgestrel-ethinyl estradiol (SEASONALE) 0.15-0.03 MG tablet Take 1 tablet by  mouth daily.    [provider]  montelukast (SINGULAIR) 10 MG tablet TAKE 1 TABLET BY MOUTH AT BEDTIME 09/12/19   Lorre Munroe, NP     Family History  Problem Relation Age of Onset  . Arthritis Mother   . Ovarian cancer Mother   . Hyperlipidemia Mother   . Hypertension Mother   . Irritable bowel syndrome Mother   . Arthritis Father   . Hypertension Father   . Asthma Father   . Asthma Sister   . Arthritis Paternal Grandmother     Social History   Socioeconomic History  . Marital status: Married    Spouse name: Derryl Harbor  . Number of children: 0  . Years of education: Not on file  . Highest education level: Not on file  Occupational History  . Occupation: CNA/ CPT  Tobacco Use  . Smoking status: Never Smoker  . Smokeless tobacco: Never Used  Vaping Use  . Vaping Use: Never used  Substance and Sexual Activity  . Alcohol use: Not Currently    Alcohol/week: 0.0 standard drinks    Comment: occasionally  . Drug use: No  . Sexual activity: Yes    Birth control/protection: Pill  Other Topics Concern  . Not on file  Social History Narrative  . Not on file   Social Determinants of Health   Financial Resource Strain:   . Difficulty of Paying Living Expenses: Not on file  Food Insecurity:   . Worried About Programme researcher, broadcasting/film/video in the Last Year: Not on file  . Ran Out of Food in the Last Year: Not on file  Transportation Needs:   . Lack of Transportation (Medical): Not on file  . Lack of Transportation (Non-Medical): Not on file  Physical Activity:   . Days of Exercise per Week: Not on file  . Minutes of Exercise per Session: Not on file  Stress:   . Feeling of Stress : Not on file  Social Connections:   . Frequency of Communication with Friends and Family: Not on file  . Frequency of Social Gatherings with Friends and Family: Not on file  . Attends Religious Services: Not on file  . Active Member of Clubs or Organizations: Not on file  . Attends Tax inspector Meetings: Not on file  . Marital Status: Not on file     Review of Systems: A 12 point ROS discussed and pertinent positives are indicated in the HPI above.  All other systems are negative.  Review of Systems  Vital Signs: There were no vitals taken for this visit.  Physical Exam Constitutional:      General: She is not in acute distress.    Appearance: She is not toxic-appearing.  Eyes:     General: No scleral icterus.    Conjunctiva/sclera: Conjunctivae normal.  Musculoskeletal:        General: No swelling. Normal range of motion.     Comments: Left lower extremity torturous subsurface varicosity begins in the mid to lower thigh courses across the knee into the proximal calf.  This is a patient with symptomatic enlarging varicosity.  No signs of superficial thrombosis or thrombophlebitis.  Skin intact.  No skin lesions.  No significant peripheral edema.  Skin:    General: Skin is  warm and dry.     Findings: No bruising or lesion.  Neurological:     General: No focal deficit present.     Mental Status: She is alert. Mental status is at baseline.  Psychiatric:        Mood and Affect: Mood normal.        Behavior: Behavior normal.     Imaging: No results found.  Labs:  CBC: Recent Labs    08/30/19 0838  WBC 7.1  HGB 13.5  HCT 40.8  PLT 253.0    COAGS: No results for input(s): INR, APTT in the last 8760 hours.  BMP: Recent Labs    08/30/19 0838  NA 137  K 4.3  CL 104  CO2 27  GLUCOSE 97  BUN 7  CALCIUM 9.3  CREATININE 0.77    LIVER FUNCTION TESTS: Recent Labs    08/30/19 0838  BILITOT 0.6  AST 19  ALT 18  ALKPHOS 44  PROT 6.8  ALBUMIN 4.1     Assessment and Plan:  Slowly enlarging mildly symptomatic left lower extremity subsurface varicosity along the mid thigh across the knee into the proximal calf with associated left GSV venous insufficiency/reflux.  Ultrasound confirms venous insufficiency from the left saphenofemoral  junction throughout the left GSV to the varicosity as well as reflux in the calf region.  Maximal diameter 8 mm.  Venous anatomy is amenable to transcatheter laser occlusion/ablation.  Varicosity is amenable to ultrasound sclerotherapy.  Plan: Initiate conservative management plan with prescription strength compression stockings 20/30 mmHg.  Daily walking regimen.  Outpatient follow-up in 2 months to assess symptoms.  Thank you for this interesting consult.  I greatly enjoyed meeting Aracelie Immaculate Crutcher and look forward to participating in their care.  A copy of this report was sent to the requesting provider on this date.  Electronically Signed: Berdine Dance 05/01/2020, 10:09 AM   I spent a total of  40 Minutes   in face to face in clinical consultation, greater than 50% of which was counseling/coordinating care for this patient with a slowly enlarging symptomatic left lower extremity varicosity and venous insufficiency.

## 2020-05-14 ENCOUNTER — Encounter: Payer: Self-pay | Admitting: Internal Medicine

## 2020-06-05 ENCOUNTER — Other Ambulatory Visit: Payer: Self-pay | Admitting: Interventional Radiology

## 2020-06-05 DIAGNOSIS — I8312 Varicose veins of left lower extremity with inflammation: Secondary | ICD-10-CM

## 2020-06-26 ENCOUNTER — Encounter: Payer: Self-pay | Admitting: *Deleted

## 2020-06-26 ENCOUNTER — Ambulatory Visit
Admission: RE | Admit: 2020-06-26 | Discharge: 2020-06-26 | Disposition: A | Discharge status: Home / Self Care | Payer: Managed Care, Other (non HMO) | Source: Ambulatory Visit | Attending: Interventional Radiology | Admitting: Interventional Radiology

## 2020-06-26 ENCOUNTER — Other Ambulatory Visit: Payer: Self-pay

## 2020-06-26 DIAGNOSIS — I8312 Varicose veins of left lower extremity with inflammation: Secondary | ICD-10-CM

## 2020-06-26 HISTORY — PX: IR RADIOLOGIST EVAL & MGMT: IMG5224

## 2020-06-26 NOTE — Progress Notes (Signed)
Patient ID: Mikayla Briggs, female   DOB: 1981/04/02, 39 y.o.   MRN: [id number]       Chief Complaint:  Left lower extremity varicose veins, venous insufficiency  Referring Physician(s): Grewall  History of Present Illness: Mikayla Briggs is a 39 y.o. female with a slowly enlarging left lower extremity varicosity.  The symptomatic varicosity begins in the mid to lower thigh and extends across the knee.  This is slowly enlarged over the last 18 months.  She does state that the recent pregnancy did seem to affect the left lower extremity varicosity.  Ultrasound was performed confirming left GSV venous insufficiency leading directly to the varicosity.  She has been compliant with a conservative management plan including prescription strength compression stockings, thigh-high.  She has been wearing these daily and while at work.  She reports no significant improvement in her symptoms and her left leg remains her primary concern.  Despite wearing the stockings and daily walking she still has left lower extremity swelling, worse at the end of the day.  Minor associated discomfort requiring occasional Tylenol or Advil.  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  At the end of the day the left leg is often fatigued and tired despite the stocking use.  Past Medical History:  Diagnosis Date  . 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 Migraines   . 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 Seasonal allergies   . Wears contact lenses     Past Surgical History:  Procedure Laterality Date  . CHOLECYSTECTOMY  11/2003  . CHROMOPERTUBATION N/A 05/08/2018   Procedure: CHROMOPERTUBATION;  Surgeon: Marcelle Overlie, MD;  Location: [facility];  Service: Gynecology;  Laterality: N/A;  . IMAGE GUIDED SINUS SURGERY N/A 09/07/2016   Procedure:  IMAGE GUIDED SINUS SURGERY;  Surgeon: Linus Salmons, MD;  Location: [facility];  Service: ENT;  Laterality: N/A;  STYKER GAVE DISK TO CECE 12-20 kp  . KNEE ARTHROSCOPY Left 2000  . KNEE ARTHROSCOPY WITH MEDIAL MENISECTOMY Right 10/10/2014   Procedure: RIGHT KNEE ARTHROSCOPY; RESECTION PLICA; CHONDROPLASTY, AND PARTIAL MEDIAL MENISCECTOMY;  Surgeon: Loreta Ave, MD;  Location: [facility];  Service: Orthopedics;  Laterality: Right;  . LAPAROSCOPIC TUBAL LIGATION Bilateral 04/26/2019   Procedure: LAPAROSCOPIC TUBAL LIGATION;  Surgeon: Marcelle Overlie, MD;  Location: [facility];  Service: Gynecology;  Laterality: Bilateral;  . LAPAROSCOPY N/A 05/08/2018   Procedure: LAPAROSCOPY DIAGNOSTIC;  Surgeon: Marcelle Overlie, MD;  Location: [facility];  Service: Gynecology;  Laterality: N/A;  fulgeration on endometriosis  . MAXILLARY ANTROSTOMY Bilateral 09/07/2016   Procedure: MAXILLARY ANTROSTOMY WITH TISSUE REMOVAL;  Surgeon: Linus Salmons, MD;  Location: [facility];  Service: ENT;  Laterality: Bilateral;  . NASAL TURBINATE REDUCTION Bilateral 09/07/2016   Procedure: TURBINATE REDUCTION/SUBMUCOSAL RESECTION;  Surgeon: Linus Salmons, MD;  Location: [facility];  Service: ENT;  Laterality: Bilateral;  . SEPTOPLASTY N/A 09/07/2016   Procedure: SEPTOPLASTY;  Surgeon: Linus Salmons, MD;  Location: [facility];  Service: ENT;  Laterality: N/A;  . TONSILLECTOMY AND ADENOIDECTOMY    . TUBAL LIGATION    . WISDOM TOOTH EXTRACTION      Allergies: Iodine, Ivp dye [iodinated diagnostic agents], Omeprazole, Penicillins, Shellfish allergy, and Soap  Medications: Prior to Admission medications   Medication Sig Start Date End  Date Taking? Authorizing Provider  cetirizine (ZYRTEC) 10 MG tablet Take 1 tablet (10 mg total) by mouth daily. 07/21/18   Lorre Munroe, NP  levocetirizine (XYZAL) 5 MG tablet TAKE 1 TABLET BY MOUTH  EACH EVENING 09/12/19   Lorre Munroe, NP  levonorgestrel-ethinyl estradiol (SEASONALE) 0.15-0.03 MG tablet Take 1 tablet by mouth daily.    [provider]  montelukast (SINGULAIR) 10 MG tablet TAKE 1 TABLET BY MOUTH AT BEDTIME 09/12/19   Lorre Munroe, NP     Family History  Problem Relation Age of Onset  . Arthritis Mother   . Ovarian cancer Mother   . Hyperlipidemia Mother   . Hypertension Mother   . Irritable bowel syndrome Mother   . Arthritis Father   . Hypertension Father   . Asthma Father   . Asthma Sister   . Arthritis Paternal Grandmother     Social History   Socioeconomic History  . Marital status: Married    Spouse name: Derryl Harbor  . Number of children: 0  . Years of education: Not on file  . Highest education level: Not on file  Occupational History  . Occupation: CNA/ CPT  Tobacco Use  . Smoking status: Never Smoker  . Smokeless tobacco: Never Used  Vaping Use  . Vaping Use: Never used  Substance and Sexual Activity  . Alcohol use: Not Currently    Alcohol/week: 0.0 standard drinks    Comment: occasionally  . Drug use: No  . Sexual activity: Yes    Birth control/protection: Pill  Other Topics Concern  . Not on file  Social History Narrative  . Not on file   Social Determinants of Health   Financial Resource Strain:   . Difficulty of Paying Living Expenses: Not on file  Food Insecurity:   . Worried About Programme researcher, broadcasting/film/video in the Last Year: Not on file  . Ran Out of Food in the Last Year: Not on file  Transportation Needs:   . Lack of Transportation (Medical): Not on file  . Lack of Transportation (Non-Medical): Not on file  Physical Activity:   . Days of Exercise per Week: Not on file  . Minutes of Exercise per Session: Not on file  Stress:   . Feeling of Stress : Not on file  Social Connections:   . Frequency of Communication with Friends and Family: Not on file  . Frequency of Social Gatherings with Friends and Family: Not on  file  . Attends Religious Services: Not on file  . Active Member of Clubs or Organizations: Not on file  . Attends Banker Meetings: Not on file  . Marital Status: Not on file    Review of Systems  Review of Systems: A 12 point ROS discussed and pertinent positives are indicated in the HPI above.  All other systems are negative.  Physical Exam No direct physical exam was performed, follow-up telephone health visit only today. Vital Signs: There were no vitals taken for this visit.  Imaging: No results found.  Labs:  CBC: Recent Labs    08/30/19 0838  WBC 7.1  HGB 13.5  HCT 40.8  PLT 253.0    COAGS: No results for input(s): INR, APTT in the last 8760 hours.  BMP: Recent Labs    08/30/19 0838  NA 137  K 4.3  CL 104  CO2 27  GLUCOSE 97  BUN 7  CALCIUM 9.3  CREATININE 0.77    LIVER FUNCTION TESTS: Recent Labs  08/30/19 0838  BILITOT 0.6  AST 19  ALT 18  ALKPHOS 44  PROT 6.8  ALBUMIN 4.1    TUMOR MARKERS: No results for input(s): AFPTM, CEA, CA199, CHROMGRNA in the last 8760 hours.  Assessment and Plan:  Slowly enlarging mildly symptomatic left lower extremity varicosity along the mid thigh and across the knee into the proximal calf with associated left GSV venous insufficiency/reflux.  Ultrasound confirms venous insufficiency from the saphenofemoral junction throughout the left GSV directly to the varicosity as the etiology of the enlarging varicosity.  Maximal diameter 8 mm.  Venous anatomy is amenable to transcatheter laser occlusion/ablation.  Varicosity remains amenable to ultrasound sclerotherapy.  She has been in a conservative management plan for the last 2 months with prescription strength thigh-high stockings, 20 to 30 mmHg.  She has been compliant with the stockings and daily walking.  Despite this conservative plans she still has similar symptoms of fatigue, swelling and discomfort in the left lower extremity.  Plan: Submit  information for insurance evaluation to assess for transcatheter laser occlusion and varicose vein sclerotherapy.   Electronically Signed: Berdine Dance 06/26/2020, 9:11 AM   I spent a total of    15 Minutes in remote  clinical consultation, greater than 50% of which was counseling/coordinating care for this patient with a enlarging symptomatic left lower extremity varicosity and venous insufficiency.    Visit type: Audio only (telephone). Audio (no video) only due to patient's lack of internet/smartphone capability. Alternative for in-person consultation at [facility], [address] [place], [place], [place]. This visit type was conducted due to national recommendations for restrictions regarding the COVID-19 Pandemic (e.g. social distancing).  This format is felt to be most appropriate for this patient at this time.  All issues noted in this document were discussed and addressed.

## 2020-07-15 ENCOUNTER — Other Ambulatory Visit: Payer: Self-pay | Admitting: Internal Medicine

## 2020-07-15 DIAGNOSIS — Z9109 Other allergy status, other than to drugs and biological substances: Secondary | ICD-10-CM

## 2020-07-16 ENCOUNTER — Other Ambulatory Visit: Payer: Self-pay | Admitting: Orthopedic Surgery

## 2020-07-16 ENCOUNTER — Other Ambulatory Visit: Payer: Self-pay

## 2020-07-16 ENCOUNTER — Ambulatory Visit
Admission: RE | Admit: 2020-07-16 | Discharge: 2020-07-16 | Disposition: A | Discharge status: Home / Self Care | Payer: Managed Care, Other (non HMO) | Source: Ambulatory Visit | Attending: Orthopedic Surgery | Admitting: Orthopedic Surgery

## 2020-07-16 DIAGNOSIS — M25561 Pain in right knee: Secondary | ICD-10-CM

## 2020-09-18 ENCOUNTER — Ambulatory Visit (INDEPENDENT_AMBULATORY_CARE_PROVIDER_SITE_OTHER): Payer: Managed Care, Other (non HMO) | Admitting: Internal Medicine

## 2020-09-18 ENCOUNTER — Encounter: Payer: Self-pay | Admitting: Internal Medicine

## 2020-09-18 ENCOUNTER — Other Ambulatory Visit: Payer: Self-pay

## 2020-09-18 VITALS — BP 126/80 | HR 76 | Temp 97.8°F | Ht 65.33 in | Wt 244.0 lb

## 2020-09-18 DIAGNOSIS — Z1159 Encounter for screening for other viral diseases: Secondary | ICD-10-CM

## 2020-09-18 DIAGNOSIS — Z0001 Encounter for general adult medical examination with abnormal findings: Secondary | ICD-10-CM

## 2020-09-18 DIAGNOSIS — R519 Headache, unspecified: Secondary | ICD-10-CM | POA: Diagnosis not present

## 2020-09-18 LAB — CBC
HCT: 42.2 % (ref 36.0–46.0)
Hemoglobin: 14.1 g/dL (ref 12.0–15.0)
MCHC: 33.3 g/dL (ref 30.0–36.0)
MCV: 88 fl (ref 78.0–100.0)
Platelets: 333 10*3/uL (ref 150.0–400.0)
RBC: 4.79 Mil/uL (ref 3.87–5.11)
RDW: 13.2 % (ref 11.5–15.5)
WBC: 10.5 10*3/uL (ref 4.0–10.5)

## 2020-09-18 LAB — COMPREHENSIVE METABOLIC PANEL
ALT: 16 U/L (ref 0–35)
AST: 19 U/L (ref 0–37)
Albumin: 4.2 g/dL (ref 3.5–5.2)
Alkaline Phosphatase: 47 U/L (ref 39–117)
BUN: 9 mg/dL (ref 6–23)
CO2: 28 mEq/L (ref 19–32)
Calcium: 9.3 mg/dL (ref 8.4–10.5)
Chloride: 105 mEq/L (ref 96–112)
Creatinine, Ser: 0.75 mg/dL (ref 0.40–1.20)
GFR: 100.39 mL/min (ref >60.00)
Glucose, Bld: 89 mg/dL (ref 70–99)
Potassium: 4.1 mEq/L (ref 3.5–5.1)
Sodium: 139 mEq/L (ref 135–145)
Total Bilirubin: 0.3 mg/dL (ref 0.2–1.2)
Total Protein: 7.1 g/dL (ref 6.0–8.3)

## 2020-09-18 LAB — LIPID PANEL
Cholesterol: 161 mg/dL (ref 0–200)
HDL: 44.8 mg/dL (ref >39.00)
LDL Cholesterol: 101 mg/dL — ABNORMAL HIGH (ref 0–99)
NonHDL: 116.07
Total CHOL/HDL Ratio: 4
Triglycerides: 75 mg/dL (ref 0.0–149.0)
VLDL: 15 mg/dL (ref 0.0–40.0)

## 2020-09-18 LAB — HEMOGLOBIN A1C: Hgb A1c MFr Bld: 5.6 % (ref 4.6–6.5)

## 2020-09-18 NOTE — Patient Instructions (Signed)
Health Maintenance, Female Adopting a healthy lifestyle and getting preventive care are important in promoting health and wellness. Ask your health care provider about:  The right schedule for you to have regular tests and exams.  Things you can do on your own to prevent diseases and keep yourself healthy. What should I know about diet, weight, and exercise? Eat a healthy diet   Eat a diet that includes plenty of vegetables, fruits, low-fat dairy products, and lean protein.  Do not eat a lot of foods that are high in solid fats, added sugars, or sodium. Maintain a healthy weight Body mass index (BMI) is used to identify weight problems. It estimates body fat based on height and weight. Your health care provider can help determine your BMI and help you achieve or maintain a healthy weight. Get regular exercise Get regular exercise. This is one of the most important things you can do for your health. Most adults should:  Exercise for at least 150 minutes each week. The exercise should increase your heart rate and make you sweat (moderate-intensity exercise).  Do strengthening exercises at least twice a week. This is in addition to the moderate-intensity exercise.  Spend less time sitting. Even light physical activity can be beneficial. Watch cholesterol and blood lipids Have your blood tested for lipids and cholesterol at 40 years of age, then have this test every 5 years. Have your cholesterol levels checked more often if:  Your lipid or cholesterol levels are high.  You are older than 40 years of age.  You are at high risk for heart disease. What should I know about cancer screening? Depending on your health history and family history, you may need to have cancer screening at various ages. This may include screening for:  Breast cancer.  Cervical cancer.  Colorectal cancer.  Skin cancer.  Lung cancer. What should I know about heart disease, diabetes, and high blood  pressure? Blood pressure and heart disease  High blood pressure causes heart disease and increases the risk of stroke. This is more likely to develop in people who have high blood pressure readings, are of African descent, or are overweight.  Have your blood pressure checked: ? Every 3-5 years if you are 18-39 years of age. ? Every year if you are 40 years old or older. Diabetes Have regular diabetes screenings. This checks your fasting blood sugar level. Have the screening done:  Once every three years after age 40 if you are at a normal weight and have a low risk for diabetes.  More often and at a younger age if you are overweight or have a high risk for diabetes. What should I know about preventing infection? Hepatitis B If you have a higher risk for hepatitis B, you should be screened for this virus. Talk with your health care provider to find out if you are at risk for hepatitis B infection. Hepatitis C Testing is recommended for:  Everyone born from 1945 through 1965.  Anyone with known risk factors for hepatitis C. Sexually transmitted infections (STIs)  Get screened for STIs, including gonorrhea and chlamydia, if: ? You are sexually active and are younger than 40 years of age. ? You are older than 40 years of age and your health care provider tells you that you are at risk for this type of infection. ? Your sexual activity has changed since you were last screened, and you are at increased risk for chlamydia or gonorrhea. Ask your health care provider if   you are at risk.  Ask your health care provider about whether you are at high risk for HIV. Your health care provider may recommend a prescription medicine to help prevent HIV infection. If you choose to take medicine to prevent HIV, you should first get tested for HIV. You should then be tested every 3 months for as long as you are taking the medicine. Pregnancy  If you are about to stop having your period (premenopausal) and  you may become pregnant, seek counseling before you get pregnant.  Take 400 to 800 micrograms (mcg) of folic acid every day if you become pregnant.  Ask for birth control (contraception) if you want to prevent pregnancy. Osteoporosis and menopause Osteoporosis is a disease in which the bones lose minerals and strength with aging. This can result in bone fractures. If you are 65 years old or older, or if you are at risk for osteoporosis and fractures, ask your health care provider if you should:  Be screened for bone loss.  Take a calcium or vitamin D supplement to lower your risk of fractures.  Be given hormone replacement therapy (HRT) to treat symptoms of menopause. Follow these instructions at home: Lifestyle  Do not use any products that contain nicotine or tobacco, such as cigarettes, e-cigarettes, and chewing tobacco. If you need help quitting, ask your health care provider.  Do not use street drugs.  Do not share needles.  Ask your health care provider for help if you need support or information about quitting drugs. Alcohol use  Do not drink alcohol if: ? Your health care provider tells you not to drink. ? You are pregnant, may be pregnant, or are planning to become pregnant.  If you drink alcohol: ? Limit how much you use to 0-1 drink a day. ? Limit intake if you are breastfeeding.  Be aware of how much alcohol is in your drink. In the [place] one drink equals one 12 oz bottle of beer (355 mL), one 5 oz glass of wine (148 mL), or one 1 oz glass of hard liquor (44 mL). General instructions  Schedule regular health, dental, and eye exams.  Stay current with your vaccines.  Tell your health care provider if: ? You often feel depressed. ? You have ever been abused or do not feel safe at home. Summary  Adopting a healthy lifestyle and getting preventive care are important in promoting health and wellness.  Follow your health care provider's instructions about healthy  diet, exercising, and getting tested or screened for diseases.  Follow your health care provider's instructions on monitoring your cholesterol and blood pressure. This information is not intended to replace advice given to you by your health care provider. Make sure you discuss any questions you have with your health care provider. Document Revised: 08/23/2018 Document Reviewed: 08/23/2018 Elsevier Patient Education  2020 Elsevier Inc.  

## 2020-09-18 NOTE — Assessment & Plan Note (Signed)
Continue Fioricet prn Will monitor

## 2020-09-18 NOTE — Progress Notes (Signed)
 Subjective:    Patient ID: Mikayla Briggs, female    DOB: 08/30/1981, 40 y.o.   MRN: [id number]  HPI  Pt presents to the clinic today for her annual exam.  Frequent Headaches: These occur 2 times a month.  She takes Fioricet as needed with good relief of symptoms.  She does not follow with neurology.  Flu: 06/2020 Tetanus: 12/2018 COVID: never Pap smear: 04/2019 Vision screen: annually Dentist: biannually  Diet: She does eat meat. She consumes fruits and veggies daily. She tries to avoid fried foods. She drinks mostly water. Exercise: None due to right arthroscopic surgery 09/04/20  Review of Systems  Past Medical History:  Diagnosis Date  . 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 Migraines   . 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 Seasonal allergies   . Wears contact lenses     Current Outpatient Medications  Medication Sig Dispense Refill  . cetirizine (ZYRTEC) 10 MG tablet Take 1 tablet (10 mg total) by mouth daily. 90 tablet 3  . levocetirizine (XYZAL) 5 MG tablet TAKE 1 TABLET BY MOUTH EACH EVENING 90 tablet 0  . levonorgestrel-ethinyl estradiol (SEASONALE) 0.15-0.03 MG tablet Take 1 tablet by mouth daily.    . montelukast (SINGULAIR) 10 MG tablet TAKE 1 TABLET BY MOUTH AT BEDTIME 90 tablet 0   No current facility-administered medications for this visit.    Allergies  Allergen Reactions  . Iodine Itching and Swelling  . 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   . Omeprazole Anaphylaxis and Shortness Of Breath  . Penicillins Anaphylaxis and Shortness Of Breath    Has patient had a PCN reaction causing immediate rash, facial/tongue/throat swelling, SOB or lightheadedness with hypotension: Yes Has patient had a PCN reaction causing severe rash  involving mucus membranes or skin necrosis: No Has patient had a PCN reaction that required hospitalization: Unknown Has patient had a PCN reaction occurring within the last 10 years: No If all of the above answers are "NO", then may proceed with Cephalosporin use.   . Shellfish Allergy Anaphylaxis and Shortness Of Breath  . Soap Itching    ANYTHING WITH FRAGRANCE    Family History  Problem Relation Age of Onset  . Arthritis Mother   . Ovarian cancer Mother   . Hyperlipidemia Mother   . Hypertension Mother   . Irritable bowel syndrome Mother   . Arthritis Father   . Hypertension Father   . Asthma Father   . Asthma Sister   . Arthritis Paternal Grandmother     Social History   Socioeconomic History  . Marital status: Married    Spouse name: Lonnie  . Number of children: 0  . Years of education: Not on file  . Highest education level: Not on file  Occupational History  . Occupation: CNA/ CPT  Tobacco Use  . Smoking status: Never Smoker  . Smokeless tobacco: Never Used  Vaping Use  . Vaping Use: Never used  Substance and Sexual Activity  . Alcohol use: Not Currently    Alcohol/week: 0.0 standard drinks    Comment: occasionally  . Drug use: No  . Sexual activity: Yes    Birth control/protection: Pill  Other Topics Concern  . Not on file  Social History Narrative  .   Not on file   Social Determinants of Health   Financial Resource Strain: Not on file  Food Insecurity: Not on file  Transportation Needs: Not on file  Physical Activity: Not on file  Stress: Not on file  Social Connections: Not on file  Intimate Partner Violence: Not on file     Constitutional: Patient reports intermittent headaches.  Denies fever, malaise, fatigue, or abrupt weight changes.  HEENT: Denies eye pain, eye redness, ear pain, ringing in the ears, wax buildup, runny nose, nasal congestion, bloody nose, or sore throat. Respiratory: Denies difficulty breathing, shortness of breath,  cough or sputum production.   Cardiovascular: Denies chest pain, chest tightness, palpitations or swelling in the hands or feet.  Gastrointestinal: Denies abdominal pain, bloating, constipation, diarrhea or blood in the stool.  GU: Denies urgency, frequency, pain with urination, burning sensation, blood in urine, odor or discharge. Musculoskeletal: Pt right knee in immobilizer. Denies decrease in range of motion, muscle pain or joint pain and swelling.  Skin: Denies redness, rashes, lesions or ulcercations.  Neurological: Denies dizziness, difficulty with memory, difficulty with speech or problems with balance and coordination.  Psych: Denies anxiety, depression, SI/HI.  No other specific complaints in a complete review of systems (except as listed in HPI above).     Objective:   Physical Exam   BP 126/80   Pulse 76   Temp 97.8 F (36.6 C) (Temporal)   Ht 5' 5.33" (1.659 m)   Wt 244 lb (110.7 kg)   LMP 06/17/2020   SpO2 98%   BMI 40.19 kg/m   Wt Readings from Last 3 Encounters:  08/30/19 254 lb (115.2 kg)  04/26/19 272 lb 8 oz (123.6 kg)  03/10/19 296 lb 9.6 oz (134.5 kg)    General: Appears her stated age, obese, in NAD. Skin: Warm, dry and intact.  HEENT: Head: normal shape and size; Eyes: sclera white, no icterus, conjunctiva pink, PERRLA and EOMs intact; Neck:  Neck supple, trachea midline. No masses, lumps or thyromegaly present.  Cardiovascular: Normal rate and rhythm. S1,S2 noted.  No murmur, rubs or gallops noted. No JVD or BLE edema.  Pulmonary/Chest: Normal effort and positive vesicular breath sounds. No respiratory distress. No wheezes, rales or ronchi noted.  Abdomen: Soft and nontender. Normal bowel sounds. No distention or masses noted. Liver, spleen and kidneys non palpable. Musculoskeletal: Right knee in immobilizer. Gait slow and steady without device. Neurological: Alert and oriented. Cranial nerves II-XII grossly intact. Coordination normal.  Psychiatric:  Mood and affect normal. Behavior is normal. Judgment and thought content normal.    BMET    Component Value Date/Time   NA 137 08/30/2019 0838   NA 137 01/02/2014 1152   K 4.3 08/30/2019 0838   K 3.9 01/02/2014 1152   CL 104 08/30/2019 0838   CL 102 01/02/2014 1152   CO2 27 08/30/2019 0838   CO2 26 01/02/2014 1152   GLUCOSE 97 08/30/2019 0838   GLUCOSE 105 (H) 01/02/2014 1152   BUN 7 08/30/2019 0838   BUN 9 01/02/2014 1152   CREATININE 0.77 08/30/2019 0838   CREATININE 0.81 01/02/2014 1152   CALCIUM 9.3 08/30/2019 0838   CALCIUM 8.8 01/02/2014 1152   GFRNONAA >60 02/23/2019 1741   GFRNONAA >60 01/02/2014 1152   GFRAA >60 02/23/2019 1741   GFRAA >60 01/02/2014 1152    Lipid Panel     Component Value Date/Time   CHOL 191 08/30/2019 0838   TRIG 132.0 08/30/2019 0838   HDL 39.40 08/30/2019   0838   CHOLHDL 5 08/30/2019 0838   VLDL 26.4 08/30/2019 0838   LDLCALC 126 (H) 08/30/2019 0838    CBC    Component Value Date/Time   WBC 7.1 08/30/2019 0838   RBC 4.63 08/30/2019 0838   HGB 13.5 08/30/2019 0838   HGB 14.5 01/02/2014 1152   HCT 40.8 08/30/2019 0838   HCT 43.6 01/02/2014 1152   PLT 253.0 08/30/2019 0838   PLT 280 01/02/2014 1152   MCV 88.1 08/30/2019 0838   MCV 90 01/02/2014 1152   MCH 29.0 04/26/2019 0650   MCHC 33.0 08/30/2019 0838   RDW 13.4 08/30/2019 0838   RDW 12.7 01/02/2014 1152   LYMPHSABS 2.7 07/20/2016 1101   LYMPHSABS 2.4 01/02/2014 1152   MONOABS 0.7 07/20/2016 1101   MONOABS 0.7 01/02/2014 1152   EOSABS 0.6 07/20/2016 1101   EOSABS 0.4 01/02/2014 1152   BASOSABS 0.0 07/20/2016 1101   BASOSABS 0.1 01/02/2014 1152    Hgb A1C Lab Results  Component Value Date   HGBA1C 5.4 08/30/2019           Assessment & Plan:   Preventative Health Maintenance:  Flu shot UTD Tetanus UTD She declines Covid vaccine Pap smear UTD, will request records. Encouraged her to consume a balanced diet and exercise regimen Advised her to see an eye  doctor and dentist annually Will check CBC, c-Met, lipid, A1c and hep C today  RTC in 1 year, sooner if needed Webb Silversmith, NP This visit occurred during the SARS-CoV-2 public health emergency.  Safety protocols were in place, including screening questions prior to the visit, additional usage of staff PPE, and extensive cleaning of exam room while observing appropriate contact time as indicated for disinfecting solutions.

## 2020-09-19 LAB — HEPATITIS C ANTIBODY
Hepatitis C Ab: NONREACTIVE
SIGNAL TO CUT-OFF: 0.01 (ref <1.00)

## 2020-09-22 ENCOUNTER — Other Ambulatory Visit: Payer: Self-pay | Admitting: Interventional Radiology

## 2020-09-22 ENCOUNTER — Other Ambulatory Visit: Payer: Self-pay

## 2020-09-22 ENCOUNTER — Telehealth: Payer: Self-pay

## 2020-09-22 DIAGNOSIS — I872 Venous insufficiency (chronic) (peripheral): Secondary | ICD-10-CM

## 2020-09-22 NOTE — Telephone Encounter (Signed)
Pt scheduled for a vascular vein procedure with Dr. Denny Levy on 10/16/20. Per Dr. Mariel Craft request,  Valium 10 mg called into pts pharmacy, [facility]. Also, called and reviewed instructions regarding taking the Valium with the patient, pt verbalized understanding.

## 2020-10-02 ENCOUNTER — Encounter: Payer: Self-pay | Admitting: Internal Medicine

## 2020-10-05 NOTE — Telephone Encounter (Signed)
Ok for note 

## 2020-10-16 ENCOUNTER — Ambulatory Visit
Admission: RE | Admit: 2020-10-16 | Discharge: 2020-10-16 | Disposition: A | Discharge status: Home / Self Care | Payer: Managed Care, Other (non HMO) | Source: Ambulatory Visit | Attending: Interventional Radiology | Admitting: Interventional Radiology

## 2020-10-16 DIAGNOSIS — I872 Venous insufficiency (chronic) (peripheral): Secondary | ICD-10-CM

## 2020-10-16 HISTORY — PX: IR EMBO VENOUS NOT HEMORR HEMANG  INC GUIDE ROADMAPPING: IMG5447

## 2020-10-22 ENCOUNTER — Ambulatory Visit
Admission: RE | Admit: 2020-10-22 | Discharge: 2020-10-22 | Disposition: A | Discharge status: Home / Self Care | Payer: Managed Care, Other (non HMO) | Source: Ambulatory Visit | Attending: Interventional Radiology | Admitting: Interventional Radiology

## 2020-10-22 DIAGNOSIS — I872 Venous insufficiency (chronic) (peripheral): Secondary | ICD-10-CM

## 2020-10-22 NOTE — Progress Notes (Signed)
Chief Complaint: Patient was seen in consultation today for left lower extremity superficial venous insufficiency at the request of Shick,Michael  Referring Physician(s): Shick,Michael  History of Present Illness: Mikayla Briggs is a 40 y.o. female who initially presented in August of 2021 for evaluation of symptomatic venous varicosities.  After failing an initial trial of conservative therapy, she underwent EVLT of the left GSV performed by Dr. Miles Costain on 10/16/20. She presents today for 1 week follow-up.   She has been doing very well.  No significant pain or discomfort.  Only minimal bruising which is not tender.  She has been wearing her compression hose but notes that the hose often roll down and create some chaffing along her upper and mid thigh.  She plans on using some gauze to protect this area.     Past Medical History:  Diagnosis Date  . 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 Migraines   . 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 Seasonal allergies   . Wears contact lenses     Past Surgical History:  Procedure Laterality Date  . CHOLECYSTECTOMY  11/2003  . CHROMOPERTUBATION N/A 05/08/2018   Procedure: CHROMOPERTUBATION;  Surgeon: Marcelle Overlie, MD;  Location: [facility];  Service: Gynecology;  Laterality: N/A;  . IMAGE GUIDED SINUS SURGERY N/A 09/07/2016   Procedure: IMAGE GUIDED SINUS SURGERY;  Surgeon: Linus Salmons, MD;  Location: [facility];  Service: ENT;  Laterality: N/A;  STYKER GAVE DISK TO CECE 12-20 kp  . IR EMBO VENOUS NOT HEMORR HEMANG  INC GUIDE ROADMAPPING  10/16/2020  . IR RADIOLOGIST EVAL & MGMT  06/26/2020  . KNEE ARTHROSCOPY Left 2000  . KNEE ARTHROSCOPY WITH MEDIAL MENISECTOMY Right 10/10/2014   Procedure: RIGHT KNEE ARTHROSCOPY; RESECTION PLICA; CHONDROPLASTY, AND PARTIAL MEDIAL MENISCECTOMY;  Surgeon: Loreta Ave, MD;  Location: [facility];  Service: Orthopedics;  Laterality: Right;  . LAPAROSCOPIC TUBAL LIGATION Bilateral 04/26/2019   Procedure: LAPAROSCOPIC TUBAL LIGATION;  Surgeon: Marcelle Overlie, MD;  Location: [facility] [place];  Service: Gynecology;  Laterality: Bilateral;  . LAPAROSCOPY N/A 05/08/2018   Procedure: LAPAROSCOPY DIAGNOSTIC;  Surgeon: Marcelle Overlie, MD;  Location: [facility];  Service: Gynecology;  Laterality: N/A;  fulgeration on endometriosis  . MAXILLARY ANTROSTOMY Bilateral 09/07/2016   Procedure: MAXILLARY ANTROSTOMY WITH TISSUE REMOVAL;  Surgeon: Linus Salmons, MD;  Location: [facility];  Service: ENT;  Laterality: Bilateral;  . NASAL TURBINATE REDUCTION Bilateral 09/07/2016   Procedure: TURBINATE REDUCTION/SUBMUCOSAL RESECTION;  Surgeon: Linus Salmons, MD;  Location: [facility];  Service: ENT;  Laterality: Bilateral;  . SEPTOPLASTY N/A 09/07/2016   Procedure: SEPTOPLASTY;  Surgeon: Linus Salmons, MD;  Location: [facility];  Service: ENT;  Laterality: N/A;  . TONSILLECTOMY AND ADENOIDECTOMY    . TUBAL LIGATION    . WISDOM TOOTH EXTRACTION      Allergies: Iodine, Ivp dye [iodinated diagnostic agents], Omeprazole, Penicillins, Shellfish allergy, and Soap  Medications: Prior to Admission medications   Medication Sig Start Date End Date Taking? Authorizing Provider  ASPIRIN 81 PO Take by mouth.    [provider]  cetirizine (ZYRTEC) 10 MG tablet Take 1 tablet (10 mg total) by mouth daily. 07/21/18   Lorre Munroe, NP  levocetirizine (XYZAL) 5 MG tablet TAKE 1 TABLET BY MOUTH EACH EVENING 07/15/20   Lorre Munroe, NP  levonorgestrel-ethinyl estradiol (SEASONALE) 0.15-0.03 MG tablet Take 1 tablet by mouth daily.    [provider]  montelukast (SINGULAIR) 10 MG tablet TAKE 1 TABLET BY MOUTH AT BEDTIME 07/15/20   Lorre Munroe, NP     Family History  Problem Relation Age of  Onset  . Arthritis Mother   . Ovarian cancer Mother   . Hyperlipidemia Mother   . Hypertension Mother   . Irritable bowel syndrome Mother   . Arthritis Father   . Hypertension Father   . Asthma Father   . Asthma Sister   . Arthritis Paternal Grandmother     Social History   Socioeconomic History  . Marital status: Married    Spouse name: Derryl Harbor  . Number of children: 0  . Years of education: Not on file  . Highest education level: Not on file  Occupational History  . Occupation: CNA/ CPT  Tobacco Use  . Smoking status: Never Smoker  . Smokeless tobacco: Never Used  Vaping Use  . Vaping Use: Never used  Substance and Sexual Activity  . Alcohol use: Not Currently    Alcohol/week: 0.0 standard drinks    Comment: occasionally  . Drug use: No  . Sexual activity: Yes    Birth control/protection: Pill  Other Topics Concern  . Not on file  Social History Narrative  . Not on file   Social Determinants of Health   Financial Resource Strain: Not on file  Food Insecurity: Not on file  Transportation Needs: Not on file  Physical Activity: Not on file  Stress: Not on file  Social Connections: Not on file    Review of Systems: A 12 point ROS discussed and pertinent positives are indicated in the HPI above.  All other systems are negative.  Review of Systems  Vital Signs: There were no vitals taken for this visit.  Physical Exam Constitutional:      General: She is not in acute distress.    Appearance: Normal appearance.  HENT:     Head: Normocephalic and atraumatic.  Eyes:     General: No scleral icterus. Cardiovascular:     Rate and Rhythm: Normal rate and regular rhythm.  Pulmonary:     Effort: Pulmonary effort is normal.  Musculoskeletal:     Left lower leg: No swelling or tenderness. No edema.       Legs:     Comments: Mild bruising medial upper calf and lower thigh  Neurological:     Mental Status: She is alert.      Imaging: [place] RAD EVAL AND  MGMT  Result Date: 10/22/2020 Please refer to "Notes" to see consult details.  IR EMBO VENOUS NOT HEMORR HEMANG  INC GUIDE ROADMAPPING  Result Date: 10/16/2020 CLINICAL DATA:  Symptomatic lower extremity varicose veins. Left GSV venous insufficiency with enlarging symptomatic left distal thigh and calf varicosities. EXAM: TRANSCATHETER LASER OCCLUSION LEFT GREATER SAPHENOUS VEIN WITH ULTRASOUND GUIDANCE: TECHNIQUE: Survey ultrasound of the leg was performed and the course of the greater saphenous vein was marked on the skin. Overlying skin prepped with ChloraPrep, draped in usual sterile fashion. After local anesthetic using 1% lidocaine, the greater saphenous vein was accessed at the mid calf level under ultrasound with a 21-gauge micropuncture needle. A 018 guidewire advanced easily. This was exchanged using a transitional dilator for a 035 J wire. Access would only advance from the mid calf level to the distal thigh because of tortuosity. Therefore a second puncture was performed in the distal  thigh left GSV to continue the access to the saphenofemoral junction. Laser treatment will be divided between the 2 access sites 2 cover from the saphenofemoral junction to the mid calf level. Over the guidewire, the 6 French delivery sheath was advanced initially to the distal thigh level. The laser fiber was advanced and positioned in the distal thigh level. This first distal segment measures 14 cm in length. Tumescent dilute 0.1% lidocaine used along the planned treatment length. Ultrasound was used to confirm appropriate tumescent anesthesia and to verify that all segments were at least 1 cm from the skin surface. The laser fiber was activated while being withdrawn for the treatment of this 14 cm length. Next, the sheath was reassembled and advanced over guidewire at the second distal thigh access level. Over the guidewire and under ultrasound the access was advanced to the saphenofemoral junction. Position  was confirmed 2 cm below the saphenofemoral junction. Laser fiber advanced and reconfirmed with ultrasound. 1% lidocaine tumescent anesthesia applied along the planned treated segment, additional 100 cc. Survey confirms a depth of 1 cm from the skin surface. Laser fiber was activated and while being withdrawn the second length of 30 cm GSV was treated successfully. Total energy delivered was 1,700joules over 284seconds to treat a total length of 44 cm of great saphenous vein. The catheter and sheath were removed and hemostasis easily achieved at the site. Patient was placed in graduated compression stockings and ambulated for 20 minutes without difficulty. Patient tolerated the procedure well, with no immediate complication. IMPRESSION: 1. Technically successful transcatheter laser occlusion of left greater saphenous vein. Patient will followup in clinic in one week. Patient knows to telephone should there be any interval questions or problems. Electronically Signed   By: Judie Petit.  Shick M.D.   On: 10/16/2020 11:52    Labs:  CBC: Recent Labs    09/18/20 1108  WBC 10.5  HGB 14.1  HCT 42.2  PLT 333.0    COAGS: No results for input(s): INR, APTT in the last 8760 hours.  BMP: Recent Labs    09/18/20 1108  NA 139  K 4.1  CL 105  CO2 28  GLUCOSE 89  BUN 9  CALCIUM 9.3  CREATININE 0.75    LIVER FUNCTION TESTS: Recent Labs    09/18/20 1108  BILITOT 0.3  AST 19  ALT 16  ALKPHOS 47  PROT 7.1  ALBUMIN 4.2    TUMOR MARKERS: No results for input(s): AFPTM, CEA, CA199, CHROMGRNA in the last 8760 hours.  Assessment and Plan:  Doing very well 1 week status post EVLT.  No complications.  Continue wearing compression hose for the next 3 weeks.   1.) F/U with Dr. Miles Costain in 1 month.     Electronically Signed: Sterling Big 10/22/2020, 10:48 AM   I spent a total of 10 Minutes in face to face in clinical consultation, greater than 50% of which was counseling/coordinating care for  left lower extremity superficial venous insufficiency.

## 2020-10-31 ENCOUNTER — Other Ambulatory Visit: Payer: Self-pay | Admitting: Interventional Radiology

## 2020-10-31 DIAGNOSIS — I872 Venous insufficiency (chronic) (peripheral): Secondary | ICD-10-CM

## 2020-11-06 ENCOUNTER — Encounter: Payer: Self-pay | Admitting: Internal Medicine

## 2020-11-06 ENCOUNTER — Other Ambulatory Visit: Payer: Self-pay | Admitting: Internal Medicine

## 2020-11-06 DIAGNOSIS — Z9109 Other allergy status, other than to drugs and biological substances: Secondary | ICD-10-CM

## 2020-11-06 MED ORDER — MONTELUKAST SODIUM 10 MG PO TABS: 10.0000 mg | ORAL_TABLET | Freq: Every day | ORAL | 2 refills | Status: DC

## 2020-11-06 MED ORDER — LEVOCETIRIZINE DIHYDROCHLORIDE 5 MG PO TABS: 5.0000 mg | ORAL_TABLET | Freq: Every evening | ORAL | 2 refills | Status: DC

## 2020-11-06 NOTE — Telephone Encounter (Signed)
Duplicate request already filled

## 2020-11-18 ENCOUNTER — Ambulatory Visit
Admission: RE | Admit: 2020-11-18 | Discharge: 2020-11-18 | Disposition: A | Discharge status: Home / Self Care | Payer: Managed Care, Other (non HMO) | Source: Ambulatory Visit | Attending: Interventional Radiology | Admitting: Interventional Radiology

## 2020-11-18 ENCOUNTER — Ambulatory Visit: Admission: RE | Admit: 2020-11-18 | Payer: Managed Care, Other (non HMO) | Source: Ambulatory Visit

## 2020-11-18 DIAGNOSIS — I872 Venous insufficiency (chronic) (peripheral): Secondary | ICD-10-CM

## 2020-11-18 NOTE — Progress Notes (Signed)
Patient ID: Mikayla Briggs, female   DOB: 05-19-1981, 40 y.o.   MRN: [id number]       Chief Complaint: Left lower extremity venous insufficiency, varicose veins   Referring Physician(s): Self  History of Present Illness: Mikayla Briggs is a 40 y.o. female who initially presented in August 2021 for symptomatic venous varicosities.  She failed initial trial of conservative management and underwent successful EVLT of the left GSV 10/16/2020.  Overall from a venous standpoint she continues to do very well.  No significant leg pain or discomfort.  Bruising has resolved.  Prescription strength compression stockings have been challenging for her.  This resulted in some irritation of the inner thigh and inguinal regions with developing cellulitis and microabscesses.  This is now been treated by her OB/GYN PA with incision and drainage, dressings, and oral antibiotics.  She has OB/GYN follow-up tomorrow.  Venous ultrasound today demonstrates occlusion of the treated segment and varicosities.  No recanalization.  Negative for DVT.  Past Medical History:  Diagnosis Date  . 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 Migraines   . 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 Seasonal allergies   . Wears contact lenses     Past Surgical History:  Procedure Laterality Date  . CHOLECYSTECTOMY  11/2003  . CHROMOPERTUBATION N/A 05/08/2018   Procedure: CHROMOPERTUBATION;  Surgeon: Marcelle Overlie, MD;  Location: [facility];  Service: Gynecology;  Laterality: N/A;  . IMAGE GUIDED SINUS SURGERY N/A 09/07/2016   Procedure: IMAGE GUIDED SINUS SURGERY;  Surgeon: Linus Salmons, MD;  Location: [facility];  Service: ENT;  Laterality: N/A;  STYKER GAVE DISK TO CECE 12-20 kp  . IR EMBO VENOUS NOT HEMORR HEMANG  INC GUIDE ROADMAPPING  10/16/2020  . IR RADIOLOGIST EVAL & MGMT   06/26/2020  . KNEE ARTHROSCOPY Left 2000  . KNEE ARTHROSCOPY WITH MEDIAL MENISECTOMY Right 10/10/2014   Procedure: RIGHT KNEE ARTHROSCOPY; RESECTION PLICA; CHONDROPLASTY, AND PARTIAL MEDIAL MENISCECTOMY;  Surgeon: Loreta Ave, MD;  Location: [facility];  Service: Orthopedics;  Laterality: Right;  . LAPAROSCOPIC TUBAL LIGATION Bilateral 04/26/2019   Procedure: LAPAROSCOPIC TUBAL LIGATION;  Surgeon: Marcelle Overlie, MD;  Location: [facility];  Service: Gynecology;  Laterality: Bilateral;  . LAPAROSCOPY N/A 05/08/2018   Procedure: LAPAROSCOPY DIAGNOSTIC;  Surgeon: Marcelle Overlie, MD;  Location: [facility];  Service: Gynecology;  Laterality: N/A;  fulgeration on endometriosis  . MAXILLARY ANTROSTOMY Bilateral 09/07/2016   Procedure: MAXILLARY ANTROSTOMY WITH TISSUE REMOVAL;  Surgeon: Linus Salmons, MD;  Location: [facility];  Service: ENT;  Laterality: Bilateral;  . NASAL TURBINATE REDUCTION Bilateral 09/07/2016   Procedure: TURBINATE REDUCTION/SUBMUCOSAL RESECTION;  Surgeon: Linus Salmons, MD;  Location: [facility];  Service: ENT;  Laterality: Bilateral;  . SEPTOPLASTY N/A 09/07/2016   Procedure: SEPTOPLASTY;  Surgeon: Linus Salmons, MD;  Location: [facility];  Service: ENT;  Laterality: N/A;  . TONSILLECTOMY AND ADENOIDECTOMY    . TUBAL LIGATION    . WISDOM TOOTH EXTRACTION      Allergies: Iodine, Ivp dye [iodinated diagnostic agents], Omeprazole, Penicillins, Shellfish allergy, and Soap  Medications: Prior to Admission medications   Medication Sig Start Date End Date Taking? Authorizing Provider  ASPIRIN 81 PO Take by mouth.    [provider]  cetirizine (ZYRTEC) 10 MG tablet Take 1 tablet (10 mg total) by mouth daily.  07/21/18   Lorre Munroe, NP  levocetirizine (XYZAL) 5 MG tablet Take 1 tablet (5 mg total) by mouth every evening. 11/06/20   Lorre Munroe, NP  levonorgestrel-ethinyl estradiol  (SEASONALE) 0.15-0.03 MG tablet Take 1 tablet by mouth daily.    [provider]  montelukast (SINGULAIR) 10 MG tablet Take 1 tablet (10 mg total) by mouth at bedtime. 11/06/20   Lorre Munroe, NP     Family History  Problem Relation Age of Onset  . Arthritis Mother   . Ovarian cancer Mother   . Hyperlipidemia Mother   . Hypertension Mother   . Irritable bowel syndrome Mother   . Arthritis Father   . Hypertension Father   . Asthma Father   . Asthma Sister   . Arthritis Paternal Grandmother     Social History   Socioeconomic History  . Marital status: Married    Spouse name: Derryl Harbor  . Number of children: 0  . Years of education: Not on file  . Highest education level: Not on file  Occupational History  . Occupation: CNA/ CPT  Tobacco Use  . Smoking status: Never Smoker  . Smokeless tobacco: Never Used  Vaping Use  . Vaping Use: Never used  Substance and Sexual Activity  . Alcohol use: Not Currently    Alcohol/week: 0.0 standard drinks    Comment: occasionally  . Drug use: No  . Sexual activity: Yes    Birth control/protection: Pill  Other Topics Concern  . Not on file  Social History Narrative  . Not on file   Social Determinants of Health   Financial Resource Strain: Not on file  Food Insecurity: Not on file  Transportation Needs: Not on file  Physical Activity: Not on file  Stress: Not on file  Social Connections: Not on file   Review of Systems: A 12 point ROS discussed and pertinent positives are indicated in the HPI above.  All other systems are negative.  Review of Systems  Vital Signs: There were no vitals taken for this visit.  Physical Exam Constitutional:      General: She is not in acute distress.    Appearance: She is not toxic-appearing.  Genitourinary:    Comments: GYN area was not evaluated today as this is closely followed by OB/GYN for treatment of localized pelvic and inguinal cellulitis with microabscess  drainage. Musculoskeletal:        General: No swelling, tenderness or deformity. Normal range of motion.     Left lower leg: No edema.     Comments: No signs of post treatment thrombophlebitis  Skin:    General: Skin is warm and dry.     Coloration: Skin is not jaundiced.      Imaging: US Venous Img Lower Unilateral Left (DVT)  Result Date: 10/22/2020 CLINICAL DATA:  40 year old female with a history of superficial venous insufficiency involving the left great saphenous vein. She is now 1 week endo venous laser ablation therapy. EXAM: LEFT LOWER EXTREMITY VENOUS DOPPLER ULTRASOUND TECHNIQUE: Gray-scale sonography with graded compression, as well as color Doppler and duplex ultrasound were performed to evaluate the lower extremity deep venous systems from the level of the common femoral vein and including the common femoral, femoral, profunda femoral, popliteal and calf veins including the posterior tibial, peroneal and gastrocnemius veins when visible. The superficial great saphenous vein was also interrogated. Spectral Doppler was utilized to evaluate flow at rest and with distal augmentation maneuvers in the common femoral, femoral and  popliteal veins. COMPARISON:  None. FINDINGS: Contralateral Common Femoral Vein: Respiratory phasicity is normal and symmetric with the symptomatic side. No evidence of thrombus. Normal compressibility. Common Femoral Vein: No evidence of thrombus. Normal compressibility, respiratory phasicity and response to augmentation. Saphenofemoral Junction: No evidence of thrombus. Normal compressibility and flow on color Doppler imaging. Profunda Femoral Vein: No evidence of thrombus. Normal compressibility and flow on color Doppler imaging. Femoral Vein: No evidence of thrombus. Normal compressibility, respiratory phasicity and response to augmentation. Popliteal Vein: No evidence of thrombus. Normal compressibility, respiratory phasicity and response to augmentation. Calf  Veins: No evidence of thrombus. Normal compressibility and flow on color Doppler imaging. Superficial Great Saphenous Vein: Successful occlusion from the saphenofemoral junction into the proximal calf. Venous Reflux:  None. Other Findings:  None. IMPRESSION: 1. Successful occlusion of the symptomatic and incompetent left great saphenous vein from the saphenofemoral junction to the proximal calf. 2. No evidence of deep venous thrombosis. Electronically Signed   By: Malachy Moan M.D.   On: 10/22/2020 13:59   [place] RAD EVAL AND MGMT  Result Date: 10/22/2020 Please refer to "Notes" to see consult details.   Labs:  CBC: Recent Labs    09/18/20 1108  WBC 10.5  HGB 14.1  HCT 42.2  PLT 333.0    COAGS: No results for input(s): INR, APTT in the last 8760 hours.  BMP: Recent Labs    09/18/20 1108  NA 139  K 4.1  CL 105  CO2 28  GLUCOSE 89  BUN 9  CALCIUM 9.3  CREATININE 0.75    LIVER FUNCTION TESTS: Recent Labs    09/18/20 1108  BILITOT 0.3  AST 19  ALT 16  ALKPHOS 47  PROT 7.1  ALBUMIN 4.2    Assessment and Plan:  1 month status post left GSV transcatheter laser occlusion and sclerotherapy.  Overall she is doing very well.  Treated segment remains occluded by ultrasound.  No delayed complication.  Negative for DVT.  Perineal and inguinal area localized cellulitis possibly related to irritation from the compression stockings being treated and evaluated closely by OB/GYN.  Outpatient follow-up for varicose veins and venous insufficiency in 6 months.   Electronically Signed: Berdine Dance 11/18/2020, 2:29 PM   I spent a total of    25 Minutes in face to face in clinical consultation, greater than 50% of which was counseling/coordinating care for this patient with venous insufficiency and varicose vein

## 2020-11-22 IMAGING — CR RIGHT ELBOW - COMPLETE 3+ VIEW
4 series · 4 of 4 positions shown · non-contrast
Comparison: None.

CLINICAL DATA: 37-year-old female with a history of fall and injury

EXAM:
RIGHT ELBOW - COMPLETE 3+ VIEW

[elbow ap]
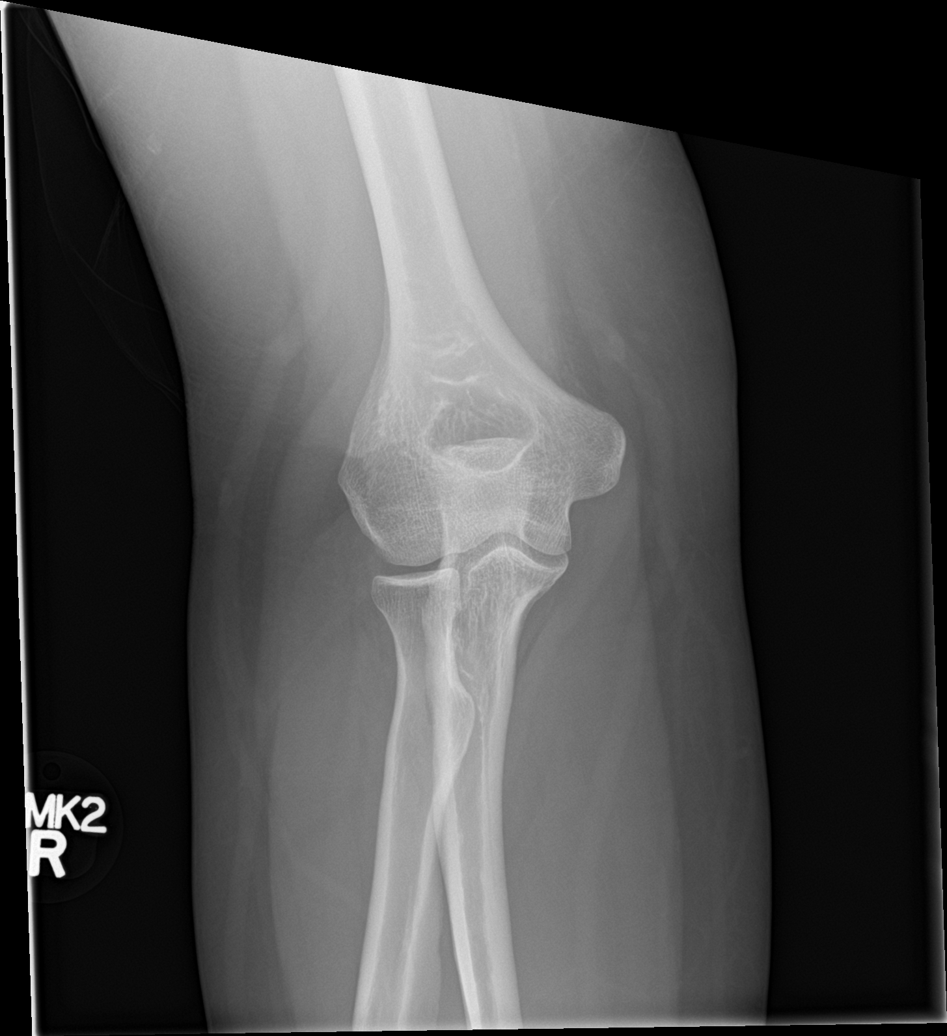

[elbow obl (1 of 2)]
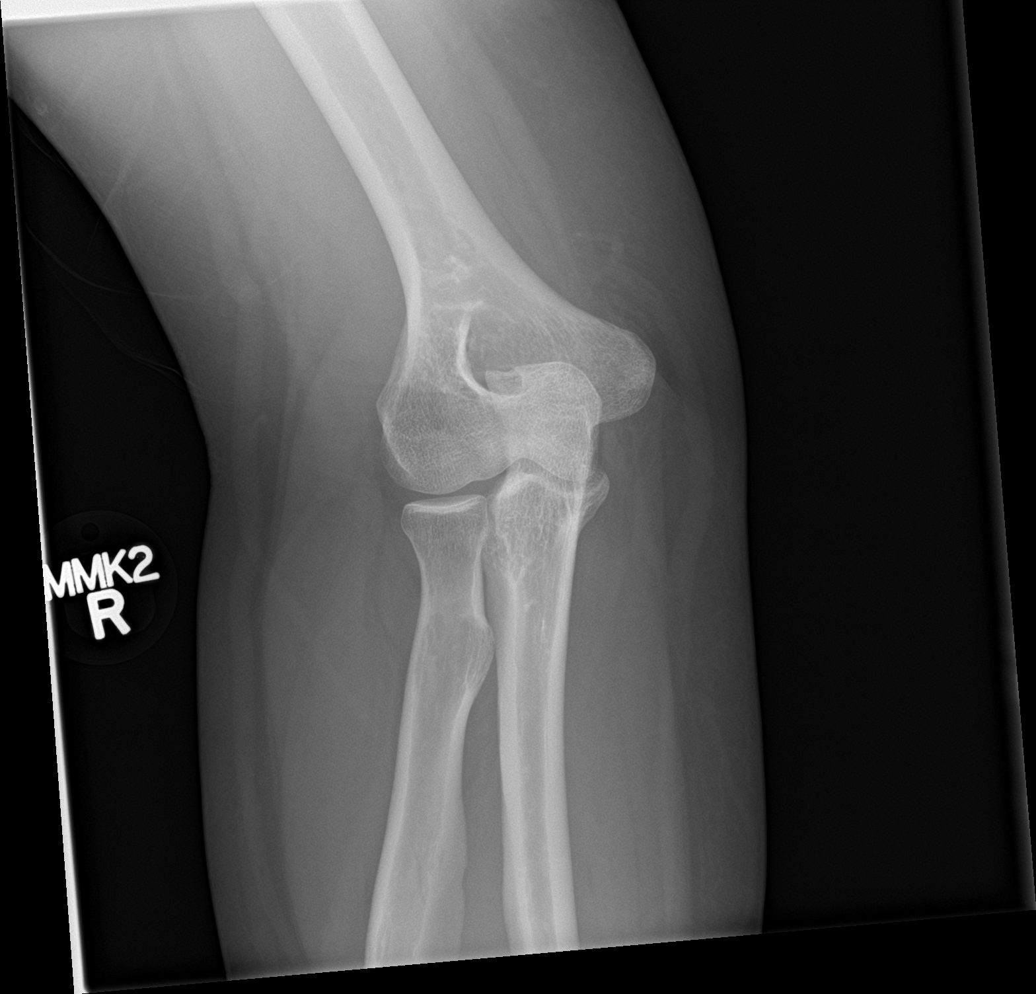

[elbow obl (2 of 2)]
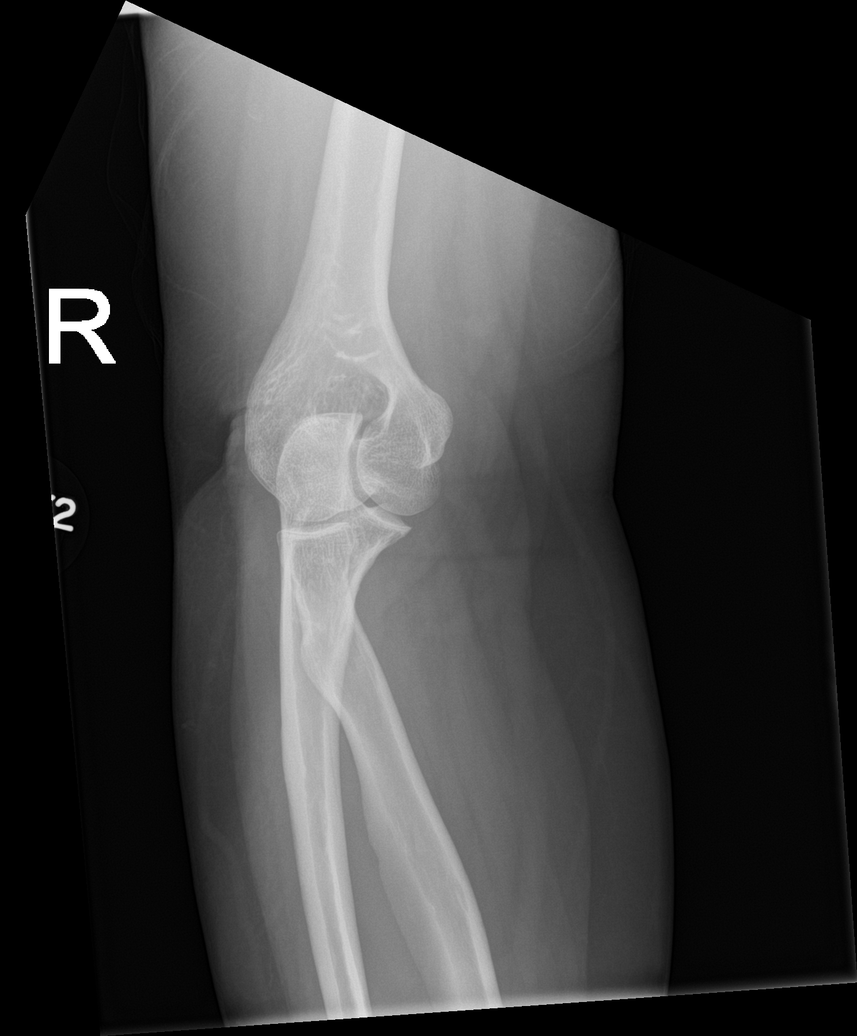

[elbow lat]
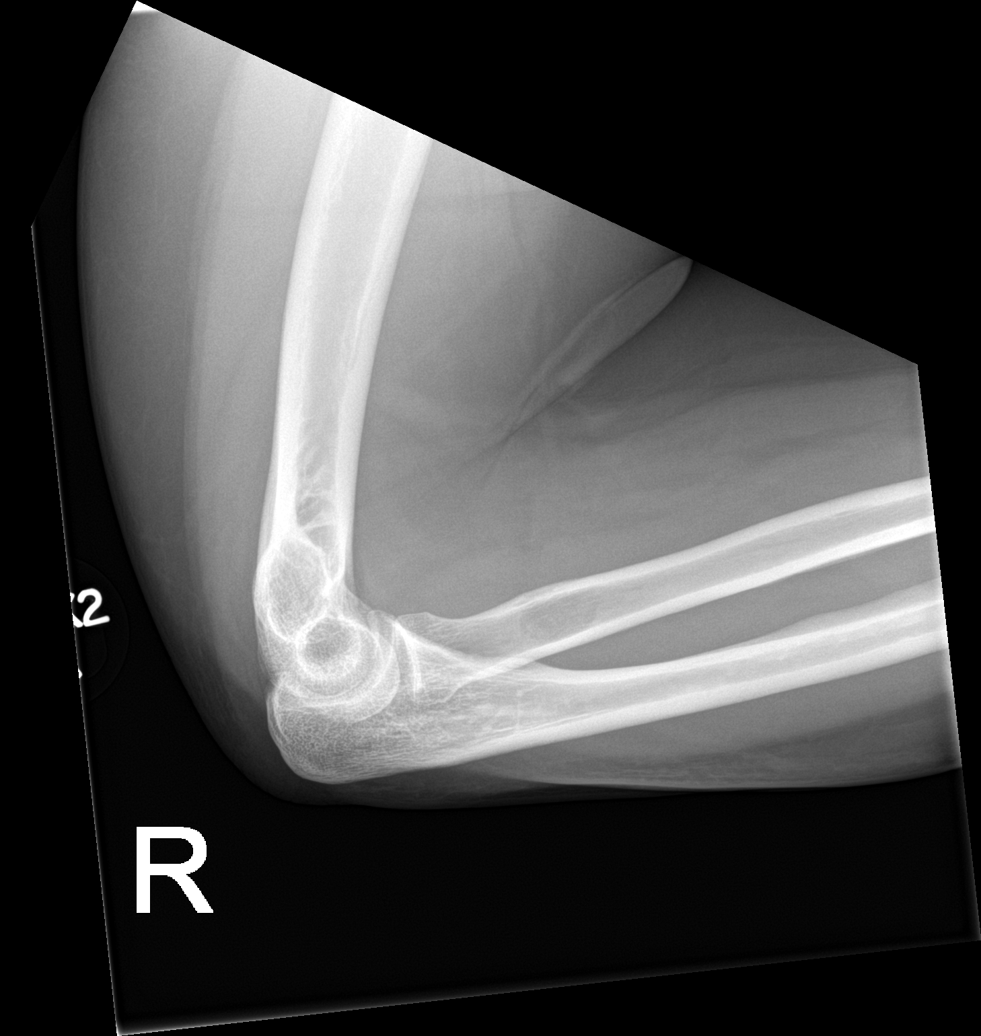

[4 of 4 positions shown; findings below may reference images not displayed]

FINDINGS: There is no evidence of fracture, dislocation, or joint effusion.
There is no evidence of arthropathy or other focal bone abnormality.
Soft tissues are unremarkable.
IMPRESSION: Negative for acute bony abnormality

## 2020-12-19 ENCOUNTER — Encounter: Payer: Self-pay | Admitting: Internal Medicine

## 2021-01-27 ENCOUNTER — Ambulatory Visit: Payer: Self-pay | Admitting: Internal Medicine

## 2021-01-27 ENCOUNTER — Other Ambulatory Visit: Payer: Self-pay

## 2021-01-27 ENCOUNTER — Encounter: Payer: Self-pay | Admitting: Internal Medicine

## 2021-01-27 DIAGNOSIS — R519 Headache, unspecified: Secondary | ICD-10-CM

## 2021-01-27 MED ORDER — NAPROXEN 500 MG PO TABS: 500.0000 mg | ORAL_TABLET | Freq: Every day | ORAL | 2 refills | Status: DC | PRN
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
## 2021-01-27 NOTE — Assessment & Plan Note (Signed)
Encouraged stress relieving measures She declines preventative medication therapy at this time Naproxen refilled today Kidney function reviewed- normal

## 2021-01-27 NOTE — Patient Instructions (Signed)

## 2021-03-25 ENCOUNTER — Other Ambulatory Visit: Payer: Self-pay | Admitting: Interventional Radiology

## 2021-03-25 DIAGNOSIS — I872 Venous insufficiency (chronic) (peripheral): Secondary | ICD-10-CM

## 2021-04-09 ENCOUNTER — Other Ambulatory Visit: Payer: Self-pay | Admitting: Obstetrics and Gynecology

## 2021-04-09 DIAGNOSIS — Z1231 Encounter for screening mammogram for malignant neoplasm of breast: Secondary | ICD-10-CM

## 2021-04-23 ENCOUNTER — Ambulatory Visit
Admission: RE | Admit: 2021-04-23 | Discharge: 2021-04-23 | Disposition: A | Discharge status: Home / Self Care | Payer: Managed Care, Other (non HMO) | Source: Ambulatory Visit | Attending: Interventional Radiology | Admitting: Interventional Radiology

## 2021-04-23 ENCOUNTER — Other Ambulatory Visit: Payer: Self-pay

## 2021-04-23 ENCOUNTER — Ambulatory Visit: Admission: RE | Admit: 2021-04-23 | Payer: Managed Care, Other (non HMO) | Source: Ambulatory Visit

## 2021-04-23 DIAGNOSIS — I872 Venous insufficiency (chronic) (peripheral): Secondary | ICD-10-CM

## 2021-04-23 NOTE — Progress Notes (Signed)
Patient ID: Mikayla Briggs, female   DOB: 1981/07/27, 41 y.o.   MRN: [id number]       Chief Complaint:  Level trended venous insufficiency, symptomatic varicose vein  Referring Physician(s): Sulayman Manning  History of Present Illness: [name] is a 40 y.o. 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  Over the last 6 months she continues to do very well.  No significant left leg pain or discomfort.  She has been intermittently wearing the compression stockings.  No recurrent symptoms.  Overall she is doing very well.  No physical limitations.  Outpatient ultrasound performed today.  Left GSV treated segment has resorbed.  Thrombosed mid calf varicosities also resorbed.  No recanalized segments.  No large subsurface varicosities.  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   Seasonal allergies    Wears contact lenses     Past Surgical History:  Procedure Laterality Date   CHOLECYSTECTOMY  11/2003   CHROMOPERTUBATION N/A 05/08/2018   Procedure: CHROMOPERTUBATION;  Surgeon: Marcelle Overlie, MD;  Location: [facility];  Service: Gynecology;  Laterality: N/A;   IMAGE GUIDED SINUS SURGERY N/A 09/07/2016   Procedure: IMAGE GUIDED SINUS SURGERY;  Surgeon: Linus Salmons, MD;  Location: [facility];  Service: ENT;  Laterality: N/A;  STYKER GAVE DISK TO CECE 12-20 kp   IR EMBO VENOUS NOT HEMORR HEMANG  INC GUIDE ROADMAPPING  10/16/2020   IR RADIOLOGIST EVAL & MGMT  06/26/2020   KNEE ARTHROSCOPY Left 2000   KNEE ARTHROSCOPY WITH MEDIAL MENISECTOMY Right 10/10/2014   Procedure: RIGHT KNEE ARTHROSCOPY; RESECTION PLICA; CHONDROPLASTY, AND PARTIAL MEDIAL MENISCECTOMY;  Surgeon: Loreta Ave, MD;  Location: [facility];  Service: Orthopedics;  Laterality: Right;   LAPAROSCOPIC TUBAL LIGATION Bilateral 04/26/2019   Procedure: LAPAROSCOPIC TUBAL LIGATION;  Surgeon: Marcelle Overlie, MD;  Location: [facility];  Service: Gynecology;  Laterality: Bilateral;   LAPAROSCOPY N/A 05/08/2018   Procedure: LAPAROSCOPY DIAGNOSTIC;  Surgeon: Marcelle Overlie, MD;  Location: [facility];  Service: Gynecology;  Laterality: N/A;  fulgeration on endometriosis   MAXILLARY ANTROSTOMY Bilateral 09/07/2016   Procedure: MAXILLARY ANTROSTOMY WITH TISSUE REMOVAL;  Surgeon: Linus Salmons, MD;  Location: [facility];  Service: ENT;  Laterality: Bilateral;   NASAL TURBINATE REDUCTION Bilateral 09/07/2016   Procedure: TURBINATE REDUCTION/SUBMUCOSAL RESECTION;  Surgeon: Linus Salmons, MD;  Location: [facility];  Service: ENT;  Laterality: Bilateral;   SEPTOPLASTY N/A 09/07/2016   Procedure: SEPTOPLASTY;  Surgeon: Linus Salmons, MD;  Location: [facility];  Service: ENT;  Laterality: N/A;   TONSILLECTOMY AND ADENOIDECTOMY     TUBAL LIGATION     WISDOM TOOTH EXTRACTION      Allergies: Iodine, Ivp dye [iodinated diagnostic agents], Omeprazole, Penicillins, Shellfish allergy, and Soap  Medications: Prior to Admission medications   Medication Sig Start Date End Date Taking? Authorizing Provider  Butalbital-APAP-Caffeine (FIORICET) 50-300-40 MG CAPS Take 1 capsule by mouth every 4 (four) hours as needed.    [provider]  cetirizine (ZYRTEC) 10 MG tablet Take 1 tablet (10 mg total) by mouth daily. 07/21/18   Lorre Munroe, NP  levocetirizine (XYZAL) 5 MG tablet Take 1 tablet (5 mg total) by mouth every evening. 11/06/20  Lorre Munroe, NP  levonorgestrel-ethinyl estradiol (SEASONALE) 0.15-0.03 MG tablet Take 1 tablet by mouth daily.    [provider]  montelukast (SINGULAIR) 10 MG tablet Take 1 tablet (10 mg total) by mouth at bedtime. 11/06/20    Lorre Munroe, NP  naproxen (NAPROSYN) 500 MG tablet Take 1 tablet (500 mg total) by mouth daily as needed for headache. 01/27/21   Lorre Munroe, NP  Vitamins/Minerals TABS Take by mouth.    [provider]     Family History  Problem Relation Age of Onset   Arthritis Mother    Ovarian cancer Mother    Hyperlipidemia Mother    Hypertension Mother    Irritable bowel syndrome Mother    Arthritis Father    Hypertension Father    Asthma Father    Asthma Sister    Arthritis Paternal Grandmother     Social History   Socioeconomic History   Marital status: Married    Spouse name: Lonnie   Number of children: 0   Years of education: Not on file   Highest education level: Not on file  Occupational History   Occupation: CNA/ CPT  Tobacco Use   Smoking status: Never   Smokeless tobacco: Never  Vaping Use   Vaping Use: Never used  Substance and Sexual Activity   Alcohol use: Not Currently    Alcohol/week: 0.0 standard drinks    Comment: occasionally   Drug use: No   Sexual activity: Yes    Birth control/protection: Pill  Other Topics Concern   Not on file  Social History Narrative   Not on file   Social Determinants of Health   Financial Resource Strain: Not on file  Food Insecurity: Not on file  Transportation Needs: Not on file  Physical Activity: Not on file  Stress: Not on file  Social Connections: Not on file     Review of Systems: A 12 point ROS discussed and pertinent positives are indicated in the HPI above.  All other systems are negative.  Review of Systems  Vital Signs: There were no vitals taken for this visit.  Physical Exam Constitutional:      General: She is not in acute distress.    Appearance: She is not toxic-appearing.  Musculoskeletal:        General: No swelling or deformity. Normal range of motion.     Left lower leg: No edema.  Skin:    General: Skin is warm and dry.     Findings: No bruising, erythema or lesion.      Imaging: No results found.  Labs:  CBC: Recent Labs    09/18/20 1108  WBC 10.5  HGB 14.1  HCT 42.2  PLT 333.0    COAGS: No results for input(s): INR, APTT in the last 8760 hours.  BMP: Recent Labs    09/18/20 1108  NA 139  K 4.1  CL 105  CO2 28  GLUCOSE 89  BUN 9  CALCIUM 9.3  CREATININE 0.75    LIVER FUNCTION TESTS: Recent Labs    09/18/20 1108  BILITOT 0.3  AST 19  ALT 16  ALKPHOS 47  PROT 7.1  ALBUMIN 4.2    Assessment and Plan:  65-month status post left GSV transcatheter laser occlusion and varicose vein sclerotherapy.  Treated segments have resorbed.  Negative for DVT.  Overall she is doing very well.  Plan: Continue wearing intermittent prescription strength compression stockings as much as possible.  Follow-up as needed  for any recurrent varicosities or symptoms.   Electronically Signed: Berdine Dance 04/23/2021, 10:00 AM   I spent a total of    25 Minutes in face to face in clinical consultation, greater than 50% of which was counseling/coordinating care for this patient with left GSV venous insufficiency

## 2021-06-12 ENCOUNTER — Ambulatory Visit
Admission: RE | Admit: 2021-06-12 | Discharge: 2021-06-12 | Disposition: A | Discharge status: Home / Self Care | Payer: Managed Care, Other (non HMO) | Source: Ambulatory Visit | Attending: Obstetrics and Gynecology | Admitting: Obstetrics and Gynecology

## 2021-06-12 ENCOUNTER — Other Ambulatory Visit: Payer: Self-pay

## 2021-06-12 DIAGNOSIS — Z1231 Encounter for screening mammogram for malignant neoplasm of breast: Secondary | ICD-10-CM

## 2021-07-30 ENCOUNTER — Other Ambulatory Visit: Payer: Self-pay | Admitting: Internal Medicine

## 2021-07-30 DIAGNOSIS — Z9109 Other allergy status, other than to drugs and biological substances: Secondary | ICD-10-CM

## 2021-07-30 NOTE — Telephone Encounter (Signed)
Requested Prescriptions  Pending Prescriptions Disp Refills  . levocetirizine (XYZAL) 5 MG tablet [Pharmacy Med Name: LEVOCETIRIZINE DIHYDROCHLORIDE 5 MG] 90 tablet 0    Sig: TAKE 1 TABLET BY MOUTH ONCE A DAY EVERY EVENING.     There is no refill protocol information for this order

## 2021-08-05 ENCOUNTER — Other Ambulatory Visit: Payer: Self-pay | Admitting: Internal Medicine

## 2021-08-05 DIAGNOSIS — Z9109 Other allergy status, other than to drugs and biological substances: Secondary | ICD-10-CM

## 2021-08-05 NOTE — Telephone Encounter (Signed)
Pharmacy called for patient, states patient is completely out of this med and needs short supply sent until refill comes in.

## 2021-08-08 ENCOUNTER — Encounter: Payer: Self-pay | Admitting: Internal Medicine

## 2021-08-10 ENCOUNTER — Telehealth: Payer: Self-pay

## 2021-08-10 ENCOUNTER — Encounter: Payer: Self-pay | Admitting: Internal Medicine

## 2021-08-10 NOTE — Telephone Encounter (Signed)
I attempted to contact the patient, no answer. I left a message on the patient vm and also sent her a mychart message. I contacted the patient pharmacy and they informed me that the patient prescription was filled yesterday and is waiting for her to pick it up.

## 2021-08-10 NOTE — Telephone Encounter (Signed)
Copied from CRM [id number]. Topic: Complaint - Staff >> Aug 10, 2021  8:48 AM Trula Slade wrote: Date of Incident: 08/05/2021  Details of complaint: Patient is very upset.  She said the pharmacy faxed her Singulair prescription 4 times with no results.  So she had to call herself and on 08/05/2021 she was told her Singulair prescription would be sent to the pharmacy that day but it was not.  So she had to go for days without her meds.  She wants to know why this happened.  It has never happened before. She stated this cannot happen again because she needs her allergy meds.  How would the patient like to see it resolved? She does not want this to ever happen again.    On a scale of 1-10, how was your experience? 0  What would it take to bring it to a 10? The patient wants to be contacted by Rene Kocher herself concerning this, via MyChart. She stated she doesn't want a reply from Regina's medical assistant.    Route to Research officer, political party.

## 2021-08-31 ENCOUNTER — Encounter: Payer: Self-pay | Admitting: Internal Medicine

## 2021-09-10 ENCOUNTER — Ambulatory Visit: Payer: Managed Care, Other (non HMO) | Admitting: Internal Medicine

## 2021-09-10 ENCOUNTER — Encounter: Payer: Self-pay | Admitting: Internal Medicine

## 2021-09-10 ENCOUNTER — Other Ambulatory Visit: Payer: Self-pay

## 2021-09-10 DIAGNOSIS — R519 Headache, unspecified: Secondary | ICD-10-CM

## 2021-09-10 DIAGNOSIS — E6609 Other obesity due to excess calories: Secondary | ICD-10-CM | POA: Insufficient documentation

## 2021-09-10 DIAGNOSIS — Z6835 Body mass index (BMI) 35.0-35.9, adult: Secondary | ICD-10-CM | POA: Insufficient documentation

## 2021-09-10 DIAGNOSIS — E66812 Obesity, class 2: Secondary | ICD-10-CM | POA: Insufficient documentation

## 2021-09-10 MED ORDER — KETOROLAC TROMETHAMINE 60 MG/2ML IM SOLN
60.0000 mg | Freq: Once | INTRAMUSCULAR | Status: AC
Start: 2021-09-10 — End: 2021-09-10
  Administered 2021-09-10: 30 mg via INTRAMUSCULAR

## 2021-09-10 MED ORDER — AMITRIPTYLINE HCL 25 MG PO TABS: 25.0000 mg | ORAL_TABLET | Freq: Every day | ORAL | 2 refills | Status: DC

## 2021-09-10 NOTE — Addendum Note (Signed)
Addended by: Lonna Cobb on: 09/10/2021 09:44 AM   Modules accepted: Orders

## 2021-09-10 NOTE — Progress Notes (Signed)
Subjective:    Patient ID: Mikayla Briggs, female    DOB: Mar 02, 1981, 40 y.o.   MRN: [id number]  HPI  Patient presents the clinic today with complaint of a migraines.  She has a history of frequent headaches, managed with Fioricet as needed. She feels like her headaches have gotten worse over the last 2 months. This is triggered by work stress, she also has family stress. She describes the pain as throbbing with associated sensitivity to light, sound and nausea. She denies dizziness, vision changes and vomiting.  She has had some associated neck pain which she describes as tension likely related to the stress.  She has never been on preventative medication for migraines.  She has failed triptans in the past.  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     cetirizine (ZYRTEC) 10 MG tablet Take 1 tablet (10 mg total) by mouth daily. 90 tablet 3   levocetirizine (XYZAL) 5 MG tablet TAKE 1 TABLET BY MOUTH ONCE A DAY EVERY EVENING. 90 tablet 0   levonorgestrel-ethinyl estradiol (SEASONALE) 0.15-0.03 MG tablet Take 1 tablet by mouth daily.     montelukast (SINGULAIR) 10 MG tablet TAKE 1 TABLET BY MOUTH EVERY NIGHT AT BEDTIME 90 tablet 2   naproxen (NAPROSYN) 500 MG tablet Take 1 tablet (500 mg total) by mouth daily as needed for headache. 30 tablet 2   Vitamins/Minerals TABS Take by mouth.     No current facility-administered medications for this visit.    Allergies  Allergen Reactions   Iodine Itching and Swelling   Ivp Dye [Iodinated Contrast Media]  Anaphylaxis    Dripped IVP dye on her arm after taking an IV out of a patient and had angioedema within 15 mins.    Omeprazole Anaphylaxis and Shortness Of Breath   Penicillins Anaphylaxis and Shortness Of Breath    Has patient had a PCN reaction causing immediate rash, facial/tongue/throat swelling, SOB or lightheadedness with hypotension: Yes Has patient had a PCN reaction causing severe rash involving mucus membranes or skin necrosis: No Has patient had a PCN reaction that required hospitalization: Unknown Has patient had a PCN reaction occurring within the last 10 years: No If all of the above answers are "NO", then may proceed with Cephalosporin use.    Shellfish Allergy Anaphylaxis and Shortness Of Breath   Soap Itching    ANYTHING WITH FRAGRANCE    Family History  Problem Relation Age of Onset   Arthritis Mother    Ovarian cancer Mother    Hyperlipidemia Mother    Hypertension Mother    Irritable bowel syndrome Mother    Arthritis Father    Hypertension Father    Asthma Father    Asthma Sister    Arthritis Paternal Grandmother     Social History   Socioeconomic History   Marital status: Married    Spouse name: Lonnie   Number of children: 0   Years of education: Not on file   Highest education level: Not on file  Occupational History  Occupation: CNA/ CPT  Tobacco Use   Smoking status: Never   Smokeless tobacco: Never  Vaping Use   Vaping Use: Never used  Substance and Sexual Activity   Alcohol use: Not Currently    Alcohol/week: 0.0 standard drinks    Comment: occasionally   Drug use: No   Sexual activity: Yes    Birth control/protection: Pill  Other Topics Concern   Not on file  Social History Narrative   Not on file   Social Determinants of Health   Financial Resource Strain: Not on file  Food Insecurity: Not on file  Transportation Needs: Not on file  Physical Activity: Not on file  Stress: Not on file  Social Connections: Not on file   Intimate Partner Violence: Not on file     Constitutional: Pt reports headache. Denies fever, malaise, fatigue or abrupt weight changes.  HEENT: Patient reports sensitivity to light and sound.  Denies eye pain, eye redness, ear pain, ringing in the ears, wax buildup, runny nose, nasal congestion, bloody nose, or sore throat. Respiratory: Denies difficulty breathing, shortness of breath, cough or sputum production.   Cardiovascular: Denies chest pain, chest tightness, palpitations or swelling in the hands or feet.  Gastrointestinal: Patient reports nausea.  Denies abdominal pain, bloating, constipation, diarrhea or blood in the stool.  Musculoskeletal: Patient reports muscle tension in neck.  Denies decrease in range of motion, difficulty with gait, or joint pain and swelling.  Neurological: Denies dizziness, difficulty with memory, difficulty with speech or problems with balance and coordination.  Psych: Patient reports stress.  Denies anxiety, depression, SI/HI.  No other specific complaints in a complete review of systems (except as listed in HPI above).   Objective:   Physical Exam  BP 126/82 (BP Location: Right Arm, Patient Position: Sitting, Cuff Size: Large)    Pulse 81    Temp 97.8 F (36.6 C) (Temporal)    Resp 18    Ht 5\' 5"  (1.651 m)    Wt 243 lb 6.4 oz (110.4 kg)    SpO2 100%    BMI 40.50 kg/m   Wt Readings from Last 3 Encounters:  01/27/21 243 lb 3.2 oz (110.3 kg)  09/18/20 244 lb (110.7 kg)  08/30/19 254 lb (115.2 kg)    General: Appears her stated age, obese, in NAD. Skin: Warm, dry and intact.  HEENT: Head: normal shape and size; Eyes: PERRLA and EOMs intact;  Cardiovascular: Normal rate and rhythm. S1,S2 noted.  No murmur, rubs or gallops noted.  Pulmonary/Chest: Normal effort and positive vesicular breath sounds. No respiratory distress. No wheezes, rales or ronchi noted.  Musculoskeletal: Normal flexion, extension, rotation of the cervical spine.  No bony  tenderness noted over the cervical spine.  Pain with palpation of bilateral paracervical muscles.  Strength 5/5 BUE.  No difficulty with gait.  Neurological: Alert and oriented. Coordination normal.  Psychiatric: Mood and affect mildly flat. Behavior is normal. Judgment and thought content normal.     BMET    Component Value Date/Time   NA 139 09/18/2020 1108   NA 137 01/02/2014 1152   K 4.1 09/18/2020 1108   K 3.9 01/02/2014 1152   CL 105 09/18/2020 1108   CL 102 01/02/2014 1152   CO2 28 09/18/2020 1108   CO2 26 01/02/2014 1152   GLUCOSE 89 09/18/2020 1108   GLUCOSE 105 (H) 01/02/2014 1152   BUN 9 09/18/2020 1108   BUN 9 01/02/2014 1152   CREATININE 0.75 09/18/2020 1108   CREATININE  0.81 01/02/2014 1152   CALCIUM 9.3 09/18/2020 1108   CALCIUM 8.8 01/02/2014 1152   GFRNONAA >60 02/23/2019 1741   GFRNONAA >60 01/02/2014 1152   GFRAA >60 02/23/2019 1741   GFRAA >60 01/02/2014 1152    Lipid Panel     Component Value Date/Time   CHOL 161 09/18/2020 1108   TRIG 75.0 09/18/2020 1108   HDL 44.80 09/18/2020 1108   CHOLHDL 4 09/18/2020 1108   VLDL 15.0 09/18/2020 1108   LDLCALC 101 (H) 09/18/2020 1108    CBC    Component Value Date/Time   WBC 10.5 09/18/2020 1108   RBC 4.79 09/18/2020 1108   HGB 14.1 09/18/2020 1108   HGB 14.5 01/02/2014 1152   HCT 42.2 09/18/2020 1108   HCT 43.6 01/02/2014 1152   PLT 333.0 09/18/2020 1108   PLT 280 01/02/2014 1152   MCV 88.0 09/18/2020 1108   MCV 90 01/02/2014 1152   MCH 29.0 04/26/2019 0650   MCHC 33.3 09/18/2020 1108   RDW 13.2 09/18/2020 1108   RDW 12.7 01/02/2014 1152   LYMPHSABS 2.7 07/20/2016 1101   LYMPHSABS 2.4 01/02/2014 1152   MONOABS 0.7 07/20/2016 1101   MONOABS 0.7 01/02/2014 1152   EOSABS 0.6 07/20/2016 1101   EOSABS 0.4 01/02/2014 1152   BASOSABS 0.0 07/20/2016 1101   BASOSABS 0.1 01/02/2014 1152    Hgb A1C Lab Results  Component Value Date   HGBA1C 5.6 09/18/2020            Assessment & Plan:    Nicki Reaper, NP This visit occurred during the SARS-CoV-2 public health emergency.  Safety protocols were in place, including screening questions prior to the visit, additional usage of staff PPE, and extensive cleaning of exam room while observing appropriate contact time as indicated for disinfecting solutions.
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
## 2021-09-10 NOTE — Assessment & Plan Note (Signed)
Encourage diet and exercise for weight loss 

## 2021-09-10 NOTE — Patient Instructions (Signed)
Migraine Headache A migraine headache is a very strong throbbing pain on one side or both sides of your head. This type of headache can also cause other symptoms. It can last from 4 hours to 3 days. Talk with your doctor about what things may bring on (trigger) this condition. What are the causes? The exact cause of this condition is not known. This condition may be triggered or caused by: Drinking alcohol. Smoking. Taking medicines, such as: Medicine used to treat chest pain (nitroglycerin). Birth control pills. Estrogen. Some blood pressure medicines. Eating or drinking certain products. Doing physical activity. Other things that may trigger a migraine headache include: Having a menstrual period. Pregnancy. Hunger. Stress. Not getting enough sleep or getting too much sleep. Weather changes. Tiredness (fatigue). What increases the risk? Being 25-55 years old. Being female. Having a family history of migraine headaches. Being Caucasian. Having depression or anxiety. Being very overweight. What are the signs or symptoms? A throbbing pain. This pain may: Happen in any area of the head, such as on one side or both sides. Make it hard to do daily activities. Get worse with physical activity. Get worse around bright lights or loud noises. Other symptoms may include: Feeling sick to your stomach (nauseous). Vomiting. Dizziness. Being sensitive to bright lights, loud noises, or smells. Before you get a migraine headache, you may get warning signs (an aura). An aura may include: Seeing flashing lights or having blind spots. Seeing bright spots, halos, or zigzag lines. Having tunnel vision or blurred vision. Having numbness or a tingling feeling. Having trouble talking. Having weak muscles. Some people have symptoms after a migraine headache (postdromal phase), such as: Tiredness. Trouble thinking (concentrating). How is this treated? Taking medicines that: Relieve  pain. Relieve the feeling of being sick to your stomach. Prevent migraine headaches. Treatment may also include: Having acupuncture. Avoiding foods that bring on migraine headaches. Learning ways to control your body functions (biofeedback). Therapy to help you know and deal with negative thoughts (cognitive behavioral therapy). Follow these instructions at home: Medicines Take over-the-counter and prescription medicines only as told by your doctor. Ask your doctor if the medicine prescribed to you: Requires you to avoid driving or using heavy machinery. Can cause trouble pooping (constipation). You may need to take these steps to prevent or treat trouble pooping: Drink enough fluid to keep your pee (urine) pale yellow. Take over-the-counter or prescription medicines. Eat foods that are high in fiber. These include beans, whole grains, and fresh fruits and vegetables. Limit foods that are high in fat and sugar. These include fried or sweet foods. Lifestyle Do not drink alcohol. Do not use any products that contain nicotine or tobacco, such as cigarettes, e-cigarettes, and chewing tobacco. If you need help quitting, ask your doctor. Get at least 8 hours of sleep every night. Limit and deal with stress. General instructions   Keep a journal to find out what may bring on your migraine headaches. For example, write down: What you eat and drink. How much sleep you get. Any change in what you eat or drink. Any change in your medicines. If you have a migraine headache: Avoid things that make your symptoms worse, such as bright lights. It may help to lie down in a dark, quiet room. Do not drive or use heavy machinery. Ask your doctor what activities are safe for you. Keep all follow-up visits as told by your doctor. This is important. Contact a doctor if: You get a migraine headache that   is different or worse than others you have had. You have more than 15 headache days in one  month. Get help right away if: Your migraine headache gets very bad. Your migraine headache lasts longer than 72 hours. You have a fever. You have a stiff neck. You have trouble seeing. Your muscles feel weak or like you cannot control them. You start to lose your balance a lot. You start to have trouble walking. You pass out (faint). You have a seizure. Summary A migraine headache is a very strong throbbing pain on one side or both sides of your head. These headaches can also cause other symptoms. This condition may be treated with medicines and changes to your lifestyle. Keep a journal to find out what may bring on your migraine headaches. Contact a doctor if you get a migraine headache that is different or worse than others you have had. Contact your doctor if you have more than 15 headache days in a month. This information is not intended to replace advice given to you by your health care provider. Make sure you discuss any questions you have with your health care provider. Document Revised: 12/22/2018 Document Reviewed: 10/12/2018 Elsevier Patient Education  2022 Elsevier Inc.  

## 2021-09-24 ENCOUNTER — Encounter: Payer: Self-pay | Admitting: Internal Medicine

## 2021-09-25 ENCOUNTER — Encounter: Payer: Self-pay | Admitting: Internal Medicine

## 2021-09-25 ENCOUNTER — Telehealth (INDEPENDENT_AMBULATORY_CARE_PROVIDER_SITE_OTHER): Payer: Managed Care, Other (non HMO) | Admitting: Internal Medicine

## 2021-09-25 VITALS — Ht 65.0 in

## 2021-09-25 DIAGNOSIS — J329 Chronic sinusitis, unspecified: Secondary | ICD-10-CM

## 2021-09-25 DIAGNOSIS — B9789 Other viral agents as the cause of diseases classified elsewhere: Secondary | ICD-10-CM | POA: Diagnosis not present

## 2021-09-25 MED ORDER — PREDNISONE 10 MG PO TABS: ORAL_TABLET | ORAL | 0 refills | Status: DC

## 2021-09-25 MED ORDER — DOXYCYCLINE HYCLATE 100 MG PO TABS: 100.0000 mg | ORAL_TABLET | Freq: Two times a day (BID) | ORAL | 0 refills | Status: DC
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
## 2021-09-26 NOTE — Patient Instructions (Signed)

## 2021-11-04 ENCOUNTER — Other Ambulatory Visit: Payer: Self-pay | Admitting: Internal Medicine

## 2021-11-04 ENCOUNTER — Encounter: Payer: Self-pay | Admitting: Internal Medicine

## 2021-11-04 DIAGNOSIS — Z9109 Other allergy status, other than to drugs and biological substances: Secondary | ICD-10-CM

## 2021-11-04 MED ORDER — LEVOCETIRIZINE DIHYDROCHLORIDE 5 MG PO TABS: ORAL_TABLET | ORAL | 0 refills | Status: DC

## 2021-11-04 NOTE — Telephone Encounter (Signed)
Requested medication (s) are due for refill today:   Prescribed today  Requested medication (s) are on the active medication list:   Just written today  Future visit scheduled:   No   Last ordered: Today for #90, 0 refills  Protocol criteria not met in regards to labs.      Requested Prescriptions  Pending Prescriptions Disp Refills   levocetirizine (XYZAL) 5 MG tablet [Pharmacy Med Name: LEVOCETIRIZINE DIHYDROCHLORIDE 5 MG] 90 tablet 0    Sig: TAKE 1 TABLET BY MOUTH ONCE A DAY EVERY EVENING.     Ear, Nose, and Throat:  Antihistamines - levocetirizine dihydrochloride Failed - 11/04/2021 10:02 AM      Failed - Cr in normal range and within 360 days    Creatinine  Date Value Ref Range Status  01/02/2014 0.81 0.60 - 1.30 mg/dL Final   Creatinine, Ser  Date Value Ref Range Status  09/18/2020 0.75 0.40 - 1.20 mg/dL Final   Creatinine, Urine  Date Value Ref Range Status  02/23/2019 15.47 mg/dL Final          Failed - eGFR is 10 or above and within 360 days    EGFR (African American)  Date Value Ref Range Status  01/02/2014 >60  Final   GFR calc Af Amer  Date Value Ref Range Status  02/23/2019 >60 >60 mL/min Final   EGFR (Non-African Amer.)  Date Value Ref Range Status  01/02/2014 >60  Final    Comment:    eGFR values <24m/min/1.73 m2 may be an indication of chronic kidney disease (CKD). Calculated eGFR is useful in patients with stable renal function. The eGFR calculation will not be reliable in acutely ill patients when serum creatinine is changing rapidly. It is not useful in  patients on dialysis. The eGFR calculation may not be applicable to patients at the low and high extremes of body sizes, pregnant women, and vegetarians.    GFR calc non Af Amer  Date Value Ref Range Status  02/23/2019 >60 >60 mL/min Final   GFR  Date Value Ref Range Status  09/18/2020 100.39 >60.00 mL/min Final    Comment:    Calculated using the CKD-EPI Creatinine Equation (2021)           Passed - Valid encounter within last 12 months    Recent Outpatient Visits           [facility] month [facility] [place]   [facility]   1 month [facility]   9 months ago [facility] [place]

## 2021-11-10 ENCOUNTER — Encounter: Payer: Self-pay | Admitting: Internal Medicine

## 2021-11-12 ENCOUNTER — Ambulatory Visit: Payer: Self-pay
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
## 2021-11-12 NOTE — Telephone Encounter (Signed)
I can see her tomorrow at 1

## 2021-11-12 NOTE — Telephone Encounter (Signed)
Apt rescheduled for 11/13/2021 at 1pm.  Pt advised.  ? ?Thanks,  ? ?-Vernona Rieger  ?

## 2021-11-13 ENCOUNTER — Ambulatory Visit (INDEPENDENT_AMBULATORY_CARE_PROVIDER_SITE_OTHER): Payer: Managed Care, Other (non HMO) | Admitting: Internal Medicine

## 2021-11-13 ENCOUNTER — Encounter: Payer: Self-pay | Admitting: Internal Medicine

## 2021-11-13 VITALS — BP 124/80 | HR 99 | Temp 97.1°F | Wt 237.0 lb

## 2021-11-13 DIAGNOSIS — M25511 Pain in right shoulder: Secondary | ICD-10-CM | POA: Diagnosis not present

## 2021-11-13 DIAGNOSIS — M545 Low back pain, unspecified: Secondary | ICD-10-CM | POA: Diagnosis not present

## 2021-11-13 DIAGNOSIS — M25512 Pain in left shoulder: Secondary | ICD-10-CM

## 2021-11-13 MED ORDER — CYCLOBENZAPRINE HCL 5 MG PO TABS: 5.0000 mg | ORAL_TABLET | Freq: Three times a day (TID) | ORAL | 0 refills | Status: DC | PRN

## 2021-11-13 MED ORDER — AMITRIPTYLINE HCL 25 MG PO TABS: 25.0000 mg | ORAL_TABLET | Freq: Every day | ORAL | 0 refills | Status: DC
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
## 2021-11-13 NOTE — Patient Instructions (Signed)

## 2021-11-16 ENCOUNTER — Ambulatory Visit: Payer: Managed Care, Other (non HMO) | Admitting: Internal Medicine

## 2022-02-04 ENCOUNTER — Encounter: Payer: Self-pay | Admitting: Internal Medicine

## 2022-02-04 ENCOUNTER — Ambulatory Visit (INDEPENDENT_AMBULATORY_CARE_PROVIDER_SITE_OTHER): Payer: Managed Care, Other (non HMO) | Admitting: Internal Medicine

## 2022-02-04 VITALS — BP 138/84 | HR 92 | Temp 97.7°F | Ht 66.5 in | Wt 229.0 lb

## 2022-02-04 DIAGNOSIS — Z0001 Encounter for general adult medical examination with abnormal findings: Secondary | ICD-10-CM

## 2022-02-04 DIAGNOSIS — F418 Other specified anxiety disorders: Secondary | ICD-10-CM | POA: Diagnosis not present

## 2022-02-04 DIAGNOSIS — Z9109 Other allergy status, other than to drugs and biological substances: Secondary | ICD-10-CM

## 2022-02-04 MED ORDER — AMITRIPTYLINE HCL 25 MG PO TABS: 25.0000 mg | ORAL_TABLET | Freq: Every day | ORAL | 1 refills | Status: DC

## 2022-02-04 MED ORDER — LEVOCETIRIZINE DIHYDROCHLORIDE 5 MG PO TABS: ORAL_TABLET | ORAL | 1 refills | Status: DC

## 2022-02-04 MED ORDER — PREDNISONE 10 MG PO TABS: ORAL_TABLET | ORAL | 0 refills | Status: DC

## 2022-02-04 MED ORDER — MONTELUKAST SODIUM 10 MG PO TABS: 10.0000 mg | ORAL_TABLET | Freq: Every day | ORAL | 1 refills | Status: DC

## 2022-02-04 NOTE — Progress Notes (Signed)
Subjective:    Patient ID: Mikayla Briggs, female    DOB: 1981/02/06, 41 y.o.   MRN: [id number]  HPI  Patient presents to clinic today for annual exam.  Flu: 06/2019 Tetanus: 12/2018 COVID: never Pap smear: 2022 Mammogram: 05/2021 Vision screening: annually Dentist: biannually  Diet: She does eat meat. She consumes fruits and veggies. She tries to avoid fried foods. She drinks mostly water. 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 90 tablet 0   Butalbital-APAP-Caffeine 50-300-40 MG CAPS Take 1 capsule by mouth every 4 (four) hours as needed.     cetirizine (ZYRTEC) 10 MG tablet Take 1 tablet (10 mg total) by mouth daily. 90 tablet 3   cyclobenzaprine (FLEXERIL) 5 MG tablet Take 1 tablet (5 mg total) by mouth 3 (three) times daily as needed for muscle spasms. 30 tablet 0   levocetirizine (XYZAL) 5 MG tablet TAKE 1 TABLET BY MOUTH ONCE A DAY EVERY EVENING. 90 tablet 0   levonorgestrel-ethinyl estradiol (SEASONALE) 0.15-0.03 MG tablet Take 1 tablet by mouth daily.     montelukast (SINGULAIR) 10 MG tablet TAKE 1 TABLET BY MOUTH EVERY NIGHT AT BEDTIME 90 tablet 2   naproxen (NAPROSYN) 500 MG tablet Take 1 tablet (500 mg total) by mouth daily as needed for headache. 30 tablet 2   tirzepatide (MOUNJARO) 2.5 MG/0.5ML Pen Inject 2.5 mg into the skin once a week.     Vitamins/Minerals TABS Take by mouth.     No current facility-administered medications for this visit.    Allergies  Allergen Reactions   Iodine Itching and Swelling   Ivp Dye [Iodinated  Contrast Media] Anaphylaxis    Dripped IVP dye on her arm after taking an IV out of a patient and had angioedema within 15 mins.    Omeprazole Anaphylaxis and Shortness Of Breath   Penicillins Anaphylaxis and Shortness Of Breath    Has patient had a PCN reaction causing immediate rash, facial/tongue/throat swelling, SOB or lightheadedness with hypotension: Yes Has patient had a PCN reaction causing severe rash involving mucus membranes or skin necrosis: No Has patient had a PCN reaction that required hospitalization: Unknown Has patient had a PCN reaction occurring within the last 10 years: No If all of the above answers are "NO", then may proceed with Cephalosporin use.    Shellfish Allergy Anaphylaxis and Shortness Of Breath   Soap Itching    ANYTHING WITH FRAGRANCE    Family History  Problem Relation Age of Onset   Arthritis Mother    Ovarian cancer Mother    Hyperlipidemia Mother    Hypertension Mother    Irritable bowel syndrome Mother    Arthritis Father    Hypertension Father    Asthma Father    Asthma Sister    Arthritis Paternal Grandmother     Social History   Socioeconomic History   Marital status: Married    Spouse name: Lonnie   Number of children: 0   Years of education: Not on file  Highest education level: Not on file  Occupational History   Occupation: CNA/ CPT  Tobacco Use   Smoking status: Never   Smokeless tobacco: Never  Vaping Use   Vaping Use: Never used  Substance and Sexual Activity   Alcohol use: Not Currently    Alcohol/week: 0.0 standard drinks    Comment: occasionally   Drug use: No   Sexual activity: Yes    Birth control/protection: Pill  Other Topics Concern   Not on file  Social History Narrative   Not on file   Social Determinants of Health   Financial Resource Strain: Not on file  Food Insecurity: Not on file  Transportation Needs: Not on file  Physical Activity: Not on file  Stress: Not on file  Social Connections: Not  on file  Intimate Partner Violence: Not on file     Constitutional: Pt reports intermittent headaches. Denies fever, malaise, fatigue, or abrupt weight changes.  HEENT: Denies eye pain, eye redness, ear pain, ringing in the ears, wax buildup, runny nose, nasal congestion, bloody nose, or sore throat. Respiratory: Denies difficulty breathing, shortness of breath, cough or sputum production.   Cardiovascular: Denies chest pain, chest tightness, palpitations or swelling in the hands or feet.  Gastrointestinal: Denies abdominal pain, bloating, constipation, diarrhea or blood in the stool.  GU: Denies urgency, frequency, pain with urination, burning sensation, blood in urine, odor or discharge. Musculoskeletal: Denies decrease in range of motion, difficulty with gait, muscle pain or joint pain and swelling.  Skin: Denies redness, rashes, lesions or ulcercations.  Neurological: Denies dizziness, difficulty with memory, difficulty with speech or problems with balance and coordination.  Psych: Patient reports situational anxiety.  Denies anxiety, depression, SI/HI.  No other specific complaints in a complete review of systems (except as listed in HPI above).  Objective:   Physical Exam   BP 138/84 (BP Location: Left Arm, Patient Position: Sitting, Cuff Size: Large)   Pulse 92   Temp 97.7 F (36.5 C) (Temporal)   Ht 5' 6.5" (1.689 m)   Wt 229 lb (103.9 kg)   SpO2 99%   BMI 36.41 kg/m   Wt Readings from Last 3 Encounters:  11/13/21 237 lb (107.5 kg)  09/10/21 243 lb 6.4 oz (110.4 kg)  01/27/21 243 lb 3.2 oz (110.3 kg)    General: Appears her stated age, obese, in NAD. Skin: Warm, dry and intact.  HEENT: Head: normal shape and size; Eyes: sclera white, no icterus, conjunctiva pink, PERRLA and EOMs intact; Neck:  Neck supple, trachea midline. No masses, lumps or thyromegaly present.  Cardiovascular: Normal rate and rhythm. S1,S2 noted.  No murmur, rubs or gallops noted. No JVD or BLE  edema.  Pulmonary/Chest: Normal effort and positive vesicular breath sounds. No respiratory distress. No wheezes, rales or ronchi noted.  Abdomen: Soft and nontender. Normal bowel sounds.  Musculoskeletal: Strength 5/5 BUE/BLE.  No difficulty with gait.  Neurological: Alert and oriented. Cranial nerves II-XII grossly intact. Coordination normal.  Psychiatric: Tearful. Behavior is normal. Judgment and thought content normal.    BMET    Component Value Date/Time   NA 139 09/18/2020 1108   NA 137 01/02/2014 1152   K 4.1 09/18/2020 1108   K 3.9 01/02/2014 1152   CL 105 09/18/2020 1108   CL 102 01/02/2014 1152   CO2 28 09/18/2020 1108   CO2 26 01/02/2014 1152   GLUCOSE 89 09/18/2020 1108   GLUCOSE 105 (H) 01/02/2014 1152   BUN 9 09/18/2020 1108  BUN 9 01/02/2014 1152   CREATININE 0.75 09/18/2020 1108   CREATININE 0.81 01/02/2014 1152   CALCIUM 9.3 09/18/2020 1108   CALCIUM 8.8 01/02/2014 1152   GFRNONAA >60 02/23/2019 1741   GFRNONAA >60 01/02/2014 1152   GFRAA >60 02/23/2019 1741   GFRAA >60 01/02/2014 1152    Lipid Panel     Component Value Date/Time   CHOL 161 09/18/2020 1108   TRIG 75.0 09/18/2020 1108   HDL 44.80 09/18/2020 1108   CHOLHDL 4 09/18/2020 1108   VLDL 15.0 09/18/2020 1108   LDLCALC 101 (H) 09/18/2020 1108    CBC    Component Value Date/Time   WBC 10.5 09/18/2020 1108   RBC 4.79 09/18/2020 1108   HGB 14.1 09/18/2020 1108   HGB 14.5 01/02/2014 1152   HCT 42.2 09/18/2020 1108   HCT 43.6 01/02/2014 1152   PLT 333.0 09/18/2020 1108   PLT 280 01/02/2014 1152   MCV 88.0 09/18/2020 1108   MCV 90 01/02/2014 1152   MCH 29.0 04/26/2019 0650   MCHC 33.3 09/18/2020 1108   RDW 13.2 09/18/2020 1108   RDW 12.7 01/02/2014 1152   LYMPHSABS 2.7 07/20/2016 1101   LYMPHSABS 2.4 01/02/2014 1152   MONOABS 0.7 07/20/2016 1101   MONOABS 0.7 01/02/2014 1152   EOSABS 0.6 07/20/2016 1101   EOSABS 0.4 01/02/2014 1152   BASOSABS 0.0 07/20/2016 1101   BASOSABS 0.1  01/02/2014 1152    Hgb A1C Lab Results  Component Value Date   HGBA1C 5.6 09/18/2020           Assessment & Plan:  Preventative Health Maintenance:  Encouraged her to get a flu shot in the fall Tetanus UTD Encouraged her to get her COVID booster Pap smear UTD, will request copy Mammogram UTD Encouraged her to consume a balanced diet and exercise regimen Advised her to see an eye doctor and dentist annually We will check CBC, c-Met, lipid, A1c today  RTC in 6 months, follow-up chronic conditions  Webb Silversmith, NP

## 2022-02-04 NOTE — Assessment & Plan Note (Signed)
Encourage diet and exercise for weight loss 

## 2022-02-04 NOTE — Assessment & Plan Note (Signed)
Support offered  

## 2022-02-04 NOTE — Patient Instructions (Signed)

## 2022-02-05 LAB — COMPLETE METABOLIC PANEL WITH GFR
AG Ratio: 1.5 (calc) (ref 1.0–2.5)
ALT: 21 U/L (ref 6–29)
AST: 20 U/L (ref 10–30)
Albumin: 4.3 g/dL (ref 3.6–5.1)
Alkaline phosphatase (APISO): 47 U/L (ref 31–125)
BUN/Creatinine Ratio: 7 (calc) (ref 6–22)
BUN: 5 mg/dL — ABNORMAL LOW (ref 7–25)
CO2: 26 mmol/L (ref 20–32)
Calcium: 9.4 mg/dL (ref 8.6–10.2)
Chloride: 105 mmol/L (ref 98–110)
Creat: 0.67 mg/dL (ref 0.50–0.99)
Globulin: 2.8 g/dL (calc) (ref 1.9–3.7)
Glucose, Bld: 95 mg/dL (ref 65–139)
Potassium: 4.1 mmol/L (ref 3.5–5.3)
Sodium: 141 mmol/L (ref 135–146)
Total Bilirubin: 0.5 mg/dL (ref 0.2–1.2)
Total Protein: 7.1 g/dL (ref 6.1–8.1)
eGFR: 113 mL/min/{1.73_m2} (ref >=60)

## 2022-02-05 LAB — LIPID PANEL
Cholesterol: 164 mg/dL (ref <200)
HDL: 47 mg/dL — ABNORMAL LOW (ref >=50)
LDL Cholesterol (Calc): 98 mg/dL (calc)
Non-HDL Cholesterol (Calc): 117 mg/dL (calc) (ref <130)
Total CHOL/HDL Ratio: 3.5 (calc) (ref <5.0)
Triglycerides: 98 mg/dL (ref <150)

## 2022-02-05 LAB — CBC
HCT: 40.9 % (ref 35.0–45.0)
Hemoglobin: 13.5 g/dL (ref 11.7–15.5)
MCH: 29.5 pg (ref 27.0–33.0)
MCHC: 33 g/dL (ref 32.0–36.0)
MCV: 89.3 fL (ref 80.0–100.0)
MPV: 11.2 fL (ref 7.5–12.5)
Platelets: 313 10*3/uL (ref 140–400)
RBC: 4.58 10*6/uL (ref 3.80–5.10)
RDW: 12.7 % (ref 11.0–15.0)
WBC: 12.4 10*3/uL — ABNORMAL HIGH (ref 3.8–10.8)

## 2022-02-05 LAB — HEMOGLOBIN A1C
Hgb A1c MFr Bld: 5.2 % of total Hgb (ref <5.7)
Mean Plasma Glucose: 103 mg/dL
eAG (mmol/L): 5.7 mmol/L

## 2022-02-15 ENCOUNTER — Encounter: Payer: Self-pay | Admitting: Internal Medicine

## 2022-02-16 ENCOUNTER — Ambulatory Visit: Payer: Self-pay | Admitting: *Deleted

## 2022-02-16 NOTE — Telephone Encounter (Incomplete)
  Chief Complaint: fall Symptoms: Tripped and fell on left side. Left hip and left side of head painful Frequency: 3am Pertinent Negatives: Patient denies  Disposition: [] ED /[] Urgent Care (no appt availability in office) / [x] Appointment(In office/virtual)/ []  [place] [facility]/ [] Home Care/ [] Refused Recommended Disposition /[] [place] [facility]/ []  Follow-up with PCP Additional Notes: Pt advised in message to call and secure appt. Appt secured prior to triage, first available. Reason for Disposition  MILD weakness (i.e., does not interfere with ability to work, go to school, normal activities) (Exception: mild weakness is a chronic symptom)    Tripped. Advised to call for appt prior to Xray requested  Answer Assessment - Initial Assessment Questions 1. MECHANISM: "How did the fall happen?"     *** 2. DOMESTIC VIOLENCE AND ELDER ABUSE SCREENING: "Did you fall because someone pushed you or tried to hurt you?" If Yes, ask: "Are you safe now?"     *** 3. ONSET: "When did the fall happen?" (e.g., minutes, hours, or days ago)     *** 4. LOCATION: "What part of the body hit the ground?" (e.g., back, buttocks, head, hips, knees, hands, head, stomach)     *** 5. INJURY: "Did you hurt (injure) yourself when you fell?" If Yes, ask: "What did you injure? Tell me more about this?" (e.g., body area; type of injury; pain severity)"     *** 6. PAIN: "Is there any pain?" If Yes, ask: "How bad is the pain?" (e.g., Scale 1-10; or mild,  moderate, severe)   - NONE (0): No pain   - MILD (1-3): Doesn't interfere with normal activities    - MODERATE (4-7): Interferes with normal activities or awakens from sleep    - SEVERE (8-10): Excruciating pain, unable to do any normal activities      *** 7. SIZE: For cuts, bruises, or swelling, ask: "How large is it?" (e.g., inches or centimeters)      *** 8. PREGNANCY: "Is there any chance you are pregnant?" "When was your last menstrual period?"      *** 9. OTHER SYMPTOMS: "Do you have any other symptoms?" (e.g., dizziness, fever, weakness; new onset or worsening).      *** 10. CAUSE: "What do you think caused the fall (or falling)?" (e.g., tripped, dizzy spell)       ***  Protocols used: [facility]

## 2022-02-16 NOTE — Telephone Encounter (Signed)
Answer Assessment - Initial Assessment Questions 1. MECHANISM: "How did the fall happen?"     Tripped 2. DOMESTIC VIOLENCE AND ELDER ABUSE SCREENING: "Did you fall because someone pushed you or tried to hurt you?" If Yes, ask: "Are you safe now?"     no 3. ONSET: "When did the fall happen?" (e.g., minutes, hours, or days ago)     3-4 am 4. LOCATION: "What part of the body hit the ground?" (e.g., back, buttocks, head, hips, knees, hands, head, stomach)     Left hip, head 5. INJURY: "Did you hurt (injure) yourself when you fell?" If Yes, ask: "What did you injure? Tell me more about this?" (e.g., body area; type of injury; pain severity)"     [place], hip 6. PAIN: "Is there any pain?" If Yes, ask: "How bad is the pain?" (e.g., Scale 1-10; or mild,  moderate, severe)   - NONE (0): No pain   - MILD (1-3): Doesn't interfere with normal activities    - MODERATE (4-7): Interferes with normal activities or awakens from sleep    - SEVERE (8-10): Excruciating pain, unable to do any normal activities      7-8/10 7. SIZE: For cuts, bruises, or swelling, ask: "How large is it?" (e.g., inches or centimeters)      no  9. OTHER SYMPTOMS: "Do you have any other symptoms?" (e.g., dizziness, fever, weakness; new onset or worsening).      No 10. CAUSE: "What do you think caused the fall (or falling)?" (e.g., tripped, dizzy spell)       Tripped  Protocols used: Falls [facility]

## 2022-02-16 NOTE — Telephone Encounter (Signed)
FYI... Pt schedule apt for 02/18/2022

## 2022-02-16 NOTE — Telephone Encounter (Signed)
This really needs to be in person so I can actually assess her hip.

## 2022-02-17 NOTE — Telephone Encounter (Signed)
Will discuss at upcoming appt.

## 2022-02-18 ENCOUNTER — Ambulatory Visit
Admission: RE | Admit: 2022-02-18 | Discharge: 2022-02-18 | Disposition: A | Discharge status: Home / Self Care | Payer: Managed Care, Other (non HMO) | Source: Ambulatory Visit | Attending: Internal Medicine | Admitting: Internal Medicine

## 2022-02-18 ENCOUNTER — Encounter: Payer: Self-pay | Admitting: Internal Medicine

## 2022-02-18 ENCOUNTER — Ambulatory Visit: Payer: Managed Care, Other (non HMO) | Admitting: Internal Medicine

## 2022-02-18 VITALS — BP 134/80 | HR 106 | Temp 97.1°F | Wt 233.0 lb

## 2022-02-18 DIAGNOSIS — M25552 Pain in left hip: Secondary | ICD-10-CM | POA: Diagnosis not present

## 2022-02-18 DIAGNOSIS — S060X0A Concussion without loss of consciousness, initial encounter: Secondary | ICD-10-CM | POA: Diagnosis not present

## 2022-02-18 DIAGNOSIS — G44311 Acute post-traumatic headache, intractable: Secondary | ICD-10-CM | POA: Diagnosis not present

## 2022-02-18 DIAGNOSIS — W010XXA Fall on same level from slipping, tripping and stumbling without subsequent striking against object, initial encounter: Secondary | ICD-10-CM

## 2022-02-18 NOTE — Progress Notes (Signed)
Subjective:    Patient ID: Mikayla Briggs, female    DOB: [birth year], 41 y.o.   MRN: [id number]  HPI  Pt presents to the clinic today with c/o left hip pain. This started after a fall that occurred 5 days ago. She thinks she may have tripped when trying to walk past her son. She describes the pain as sharp and stabbing. The pain can be worse with weight bearing. She does have some numbness and tingling in her left leg. She denies weakness of the left leg. She did hit her head and has been having a headache as well. The headache is located on the left side of her head. She describes the pain as sore, achy, dull and throbbing. She reports some difficulty expressing her words, blurred vision, but denies sensitivity to light or sound. She is having some left side neck pain. She denies nausea, vomiting or excessive sleepiness. She has tried Ibuprofen and muscle relaxers with some relief of symptoms.   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 90 tablet 1   Butalbital-APAP-Caffeine 50-300-40 MG CAPS Take 1 capsule by mouth every 4 (four) hours as needed.     cetirizine (ZYRTEC) 10 MG tablet Take 1 tablet (10 mg total) by mouth daily. 90 tablet 3   cyclobenzaprine (FLEXERIL) 5 MG tablet Take 1 tablet (5 mg total) by mouth 3 (three) times daily as needed for muscle spasms. 30 tablet 0   levocetirizine (XYZAL) 5 MG tablet TAKE 1 TABLET BY MOUTH ONCE A DAY EVERY EVENING. 90 tablet 1   levonorgestrel-ethinyl estradiol (SEASONALE) 0.15-0.03 MG tablet Take 1 tablet by mouth daily.      montelukast (SINGULAIR) 10 MG tablet Take 1 tablet (10 mg total) by mouth at bedtime. 90 tablet 1   naproxen (NAPROSYN) 500 MG tablet Take 1 tablet (500 mg total) by mouth daily as needed for headache. 30 tablet 2   predniSONE (DELTASONE) 10 MG tablet Take 6 tabs on day 1, 5 tabs on day 2, 4 tabs on day 3, 3 tabs on day 4, 2 tabs on day 5, 1 tab on day 6 21 tablet 0   tirzepatide (MOUNJARO) 2.5 MG/0.5ML Pen Inject 2.5 mg into the skin once a week.     Vitamins/Minerals TABS Take by mouth.     No current facility-administered medications for this visit.    Allergies  Allergen Reactions   Iodine Itching and Swelling   Ivp Dye [Iodinated Contrast Media] Anaphylaxis    Dripped IVP dye on her arm after taking an IV out of a patient and had angioedema within 15 mins.    Omeprazole Anaphylaxis and Shortness Of Breath   Penicillins Anaphylaxis and Shortness Of Breath    Has patient had a PCN reaction causing immediate rash, facial/tongue/throat swelling, SOB or lightheadedness with hypotension: Yes Has patient had a PCN reaction causing severe rash involving mucus membranes or skin necrosis: No Has patient had a PCN reaction that required hospitalization: Unknown Has patient had a PCN reaction occurring within  the last 10 years: No If all of the above answers are "NO", then may proceed with Cephalosporin use.    Shellfish Allergy Anaphylaxis and Shortness Of Breath   Soap Itching    ANYTHING WITH FRAGRANCE    Family History  Problem Relation Age of Onset   Arthritis Mother    Ovarian cancer Mother    Hyperlipidemia Mother    Hypertension Mother    Irritable bowel syndrome Mother    Arthritis Father    Hypertension Father    Asthma Father    Asthma Sister    Arthritis Paternal Grandmother     Social History   Socioeconomic History   Marital status: Married    Spouse name: Lonnie   Number of children: 0   Years of education: Not on file   Highest education level: Not on file   Occupational History   Occupation: CNA/ CPT  Tobacco Use   Smoking status: Never   Smokeless tobacco: Never  Vaping Use   Vaping Use: Never used  Substance and Sexual Activity   Alcohol use: Not Currently    Alcohol/week: 0.0 standard drinks of alcohol    Comment: occasionally   Drug use: No   Sexual activity: Yes    Birth control/protection: Pill  Other Topics Concern   Not on file  Social History Narrative   Not on file   Social Determinants of Health   Financial Resource Strain: Not on file  Food Insecurity: Not on file  Transportation Needs: Not on file  Physical Activity: Not on file  Stress: Not on file  Social Connections: Not on file  Intimate Partner Violence: Not on file     Constitutional: Pt reports headache. Denies fever, malaise, fatigue, or abrupt weight changes.  HEENT: Pt reports blurred vision. Denies eye pain, eye redness, ear pain, ringing in the ears, wax buildup, runny nose, nasal congestion, bloody nose, or sore throat. Respiratory: Denies difficulty breathing, shortness of breath, cough or sputum production.   Cardiovascular: Denies chest pain, chest tightness, palpitations or swelling in the hands or feet.  Gastrointestinal: Denies abdominal pain, bloating, constipation, diarrhea or blood in the stool.  Musculoskeletal: Pt reports left hip pain, left side neck pain. Denies decrease in range of motion, difficulty with gait, or joint swelling.  Skin: Denies redness, rashes, lesions or ulcercations.  Neurological: Denies dizziness, difficulty with memory, difficulty with speech or problems with balance and coordination.    No other specific complaints in a complete review of systems (except as listed in HPI above).  Objective:   Physical Exam BP 134/80 (BP Location: Left Arm, Patient Position: Sitting, Cuff Size: Large)   Pulse (!) 106   Temp (!) 97.1 F (36.2 C) (Temporal)   Wt 233 lb (105.7 kg)   SpO2 99%   BMI 37.04 kg/m   Wt Readings  from Last 3 Encounters:  02/04/22 229 lb (103.9 kg)  11/13/21 237 lb (107.5 kg)  09/10/21 243 lb 6.4 oz (110.4 kg)    General: Appears her stated age, obese, in NAD. Skin: Warm, dry and intact.  HEENT: Head: normal shape and size; Eyes: sclera white, no icterus, conjunctiva pink, PERRLA and EOMs intact;  Cardiovascular: Tachycardic with normal rhythm. S1,S2 noted.  No murmur, rubs or gallops noted.  Pulmonary/Chest: Normal effort and positive vesicular breath sounds. No respiratory distress. No wheezes, rales or ronchi noted.  Musculoskeletal: Normal flexion, extension and rotation of the cervical spine. Bony tenderness noted over the cervical spine and left  paracervical muscles. Shoulder shrug equal. Normal abduction, adduction and external rotation of the left hip. Decreased internal rotation of the left hip secondary to pain. Pain with palpation over the left trochanter. Strength 5/5 BLE. No difficulty with gait.  Neurological: Alert and oriented.  Coordination normal.    BMET    Component Value Date/Time   NA 141 02/04/2022 1418   NA 137 01/02/2014 1152   K 4.1 02/04/2022 1418   K 3.9 01/02/2014 1152   CL 105 02/04/2022 1418   CL 102 01/02/2014 1152   CO2 26 02/04/2022 1418   CO2 26 01/02/2014 1152   GLUCOSE 95 02/04/2022 1418   GLUCOSE 105 (H) 01/02/2014 1152   BUN 5 (L) 02/04/2022 1418   BUN 9 01/02/2014 1152   CREATININE 0.67 02/04/2022 1418   CALCIUM 9.4 02/04/2022 1418   CALCIUM 8.8 01/02/2014 1152   GFRNONAA >60 02/23/2019 1741   GFRNONAA >60 01/02/2014 1152   GFRAA >60 02/23/2019 1741   GFRAA >60 01/02/2014 1152    Lipid Panel     Component Value Date/Time   CHOL 164 02/04/2022 1418   TRIG 98 02/04/2022 1418   HDL 47 (L) 02/04/2022 1418   CHOLHDL 3.5 02/04/2022 1418   VLDL 15.0 09/18/2020 1108   LDLCALC 98 02/04/2022 1418    CBC    Component Value Date/Time   WBC 12.4 (H) 02/04/2022 1418   RBC 4.58 02/04/2022 1418   HGB 13.5 02/04/2022 1418   HGB  14.5 01/02/2014 1152   HCT 40.9 02/04/2022 1418   HCT 43.6 01/02/2014 1152   PLT 313 02/04/2022 1418   PLT 280 01/02/2014 1152   MCV 89.3 02/04/2022 1418   MCV 90 01/02/2014 1152   MCH 29.5 02/04/2022 1418   MCHC 33.0 02/04/2022 1418   RDW 12.7 02/04/2022 1418   RDW 12.7 01/02/2014 1152   LYMPHSABS 2.7 07/20/2016 1101   LYMPHSABS 2.4 01/02/2014 1152   MONOABS 0.7 07/20/2016 1101   MONOABS 0.7 01/02/2014 1152   EOSABS 0.6 07/20/2016 1101   EOSABS 0.4 01/02/2014 1152   BASOSABS 0.0 07/20/2016 1101   BASOSABS 0.1 01/02/2014 1152    Hgb A1C Lab Results  Component Value Date   HGBA1C 5.2 02/04/2022            Assessment & Plan:   Acute Left Hip Pain, Acute Headache with Mild Concussion s/p Fall:  No indication for CT head at this time Encouraged brain rest, she declines work note at this time Xray left hip today Encouraged rest, ice and elevation Continue Ibuprofen and muscle relaxers as needed  Will follow up after imaging with further recommendation at treatment plan  RTC in 5 months for follow up chronic conditions Webb Silversmith, NP

## 2022-02-18 NOTE — Patient Instructions (Signed)
Concussion, Adult  A concussion is a brain injury from a hard, direct hit (trauma) to your head or body. This direct hit causes your brain to quickly shake back and forth inside your skull. A concussion may also be called a mild traumatic brain injury (TBI). Healing from this injury can take time. What are the causes? This condition is caused by: A direct hit to your head, such as: Running into a player during a game. Being hit in a fight. Hitting your head on a hard surface. A quick and sudden movement of the head or neck, such as in a car crash. What are the signs or symptoms? The signs of a concussion can be hard to notice. They may be missed by you, family members, and doctors. You may look fine on the outside but may not act or feel normal. Physical symptoms Headaches. Being dizzy. Problems with body balance. Being sensitive to light or noise. Vomiting or feeling like you may vomit. Being tired. Problems seeing or hearing. Not sleeping or eating as you used to. Seizure. Mental and emotional symptoms Feeling grouchy (irritable). Having mood changes. Problems remembering things. Trouble focusing your mind (concentrating), organizing, or making decisions. Being slow to think, act, react, speak, or read. Feeling worried or nervous (anxious). Feeling sad (depressed). How is this treated? This condition may be treated by: Stopping sports or activity if you are injured. If you hit your head or have signs of concussion: Do not return to sports or activities the same day. Get checked by a doctor before you return to your activities. Resting your body and your mind. Being watched carefully, often at home. Medicines to help with symptoms such as: Headaches. Feeling like you may vomit. Problems with sleep. Avoiding alcohol and drugs. Being asked to go to a concussion clinic or a place to help you recover (rehabilitation center). Recovery from a concussion can take time. Return to  activities only: When you are fully healed. When your doctor says it is safe. Avoid taking strong pain medicines (opioids) for a concussion. Follow these instructions at home: Activity Limit activities that need a lot of thought or focus, such as: Homework or work for your job. Watching TV. Using the computer or phone. Playing memory games and puzzles. Rest. Rest helps your brain heal. Make sure you: Get plenty of sleep. Most adults should get 7-9 hours of sleep each night. Rest during the day. Take naps or breaks when you feel tired. Avoid activity like exercise until your doctor says its safe. Stop any activity that makes symptoms worse. Do not do activities that could cause a second concussion, such as riding a bike or playing sports. Ask your doctor when you can return to your normal activities, such as school, work, sports, and driving. Your ability to react may be slower. Do not do these activities if you are dizzy. General instructions  Take over-the-counter and prescription medicines only as told by your doctor. Do not drink alcohol until your doctor says you can. Watch your symptoms and tell other people to do the same. Other problems can occur after a concussion. Older adults have a higher risk of serious problems. Tell your work manager, teachers, school nurse, school counselor, coach, or athletic trainer about your injury and symptoms. Tell them about what you can or cannot do. Keep all follow-up visits as told by your doctor. This is important. How is this prevented? It is very important that you do not get another brain injury. In   rare cases, another injury can cause brain damage that will not go away, brain swelling, or death. The risk of this is greatest in the first 7-10 days after a head injury. To avoid injuries: Stop activities that could lead to a second concussion, such as contact sports, until your doctor says it is okay. When you return to sports or activities: Do  not crash into other players. This is how most concussions happen. Follow the rules. Respect other players. Do not engage in violent behavior while playing. Get regular exercise. Do strength and balance training. Wear a helmet that fits you well during sports, biking, or other activities. Helmets can help protect you from serious skull and brain injuries, but they do not protect you from a concussion. Even when wearing a helmet, you should avoid being hit in the head. Contact a doctor if: Your symptoms do not get better. You have new symptoms. You have another injury. Get help right away if: You have bad headaches or your headaches get worse. You feel weak or numb in any part of your body. You feel mixed up (confused). Your balance gets worse. You vomit often. You feel more sleepy than normal. You cannot speak well, or have slurred speech. You have a seizure. Others have trouble waking you up. You have changes in how you act. You have changes in how you see (vision). You pass out (lose consciousness). These symptoms may be an emergency. Do not wait to see if the symptoms will go away. Get medical help right away. Call your local emergency services (911 in the [place] Do not drive yourself to the hospital. Summary A concussion is a brain injury from a hard, direct hit (trauma) to your head or body. This condition is treated with rest and careful watching of symptoms. Ask your doctor when you can return to your normal activities, such as school, work, or driving. Get help right away if you have a very bad headache, feel weak in any part of your body, have a seizure, have changes in how you act or see, or if you are mixed up or more sleepy than normal. This information is not intended to replace advice given to you by your health care provider. Make sure you discuss any questions you have with your health care provider. Document Revised: 11/13/2020 Document Reviewed: 11/13/2020 Elsevier  Patient Education  2023 [employer]vier Inc.  

## 2022-04-28 ENCOUNTER — Other Ambulatory Visit: Payer: Self-pay | Admitting: Obstetrics and Gynecology

## 2022-04-28 DIAGNOSIS — Z1231 Encounter for screening mammogram for malignant neoplasm of breast: Secondary | ICD-10-CM

## 2022-06-01 ENCOUNTER — Other Ambulatory Visit: Payer: Self-pay | Admitting: Internal Medicine

## 2022-06-02 NOTE — Telephone Encounter (Signed)
Unable to refill per protocol, request is too soon, last RF was 02/04/22 for 90 and 1 refill. Will refuse this request.  Requested Prescriptions  Pending Prescriptions Disp Refills  . amitriptyline (ELAVIL) 25 MG tablet [Pharmacy Med Name: AMITRIPTYLINE HCL 25 MG TAB] 90 tablet 1    Sig: TAKE 1 TABLET BY MOUTH AT BEDTIME     Psychiatry:  Antidepressants - Heterocyclics (TCAs) Passed - 06/01/2022  2:01 PM      Passed - Valid encounter within last 6 months    Recent Outpatient Visits          3 months ago Left hip pain   [facility] [place], Coralie Keens, NP   3 months ago Encounter for general adult medical examination with abnormal findings   [facility] [place], Coralie Keens, NP   6 months ago Acute midline low back pain without sciatica   [facility] [place], Coralie Keens, NP   8 months ago Viral sinusitis   [facility] [place], Coralie Keens, NP   8 months ago Frequent headaches   [facility] [place], Coralie Keens, NP      Future Appointments            In 2 months Baity, Coralie Keens, NP [facility], [facility]

## 2022-06-09 ENCOUNTER — Encounter: Payer: Self-pay | Admitting: Internal Medicine

## 2022-06-09 NOTE — Telephone Encounter (Signed)
I don't see this on her list is it okay to fill?   Thanks,   -Mickel Baas

## 2022-06-10 MED ORDER — ONDANSETRON 4 MG PO TBDP: 4.0000 mg | ORAL_TABLET | Freq: Three times a day (TID) | ORAL | 0 refills | Status: DC | PRN

## 2022-06-14 ENCOUNTER — Ambulatory Visit
Admission: RE | Admit: 2022-06-14 | Discharge: 2022-06-14 | Disposition: A | Discharge status: Home / Self Care | Payer: Managed Care, Other (non HMO) | Source: Ambulatory Visit | Attending: Obstetrics and Gynecology | Admitting: Obstetrics and Gynecology

## 2022-06-14 DIAGNOSIS — Z1231 Encounter for screening mammogram for malignant neoplasm of breast: Secondary | ICD-10-CM

## 2022-06-28 ENCOUNTER — Ambulatory Visit: Payer: Managed Care, Other (non HMO) | Admitting: Internal Medicine

## 2022-06-28 ENCOUNTER — Encounter: Payer: Self-pay | Admitting: Internal Medicine

## 2022-06-28 ENCOUNTER — Ambulatory Visit: Payer: Self-pay

## 2022-06-28 VITALS — BP 136/74 | HR 108 | Temp 97.1°F | Wt 228.0 lb

## 2022-06-28 DIAGNOSIS — J209 Acute bronchitis, unspecified: Secondary | ICD-10-CM | POA: Diagnosis not present

## 2022-06-28 MED ORDER — AZITHROMYCIN 250 MG PO TABS: ORAL_TABLET | ORAL | 0 refills | Status: DC

## 2022-06-28 MED ORDER — PREDNISONE 10 MG PO TABS: ORAL_TABLET | ORAL | 0 refills | Status: DC

## 2022-06-28 NOTE — Telephone Encounter (Signed)
  Chief Complaint: Productive cough Symptoms: Coughing up green phlegm Frequency: 3 days Pertinent Negatives: Patient denies fever, SOB Disposition: [] ED /[] Urgent Care (no appt availability in office) / [x] Appointment(In office/virtual)/ []  [facility]/ [] Home Care/ [] Refused Recommended Disposition /[] [facility]/ []  Follow-up with PCP Additional Notes: Pt started with sinus congestion. This has now dropped into her chest. Productive cough with green phlegm.   Reason for Disposition  [1] Continuous (nonstop) coughing interferes with work or school AND [2] no improvement using cough treatment per Care Advice  Answer Assessment - Initial Assessment Questions 1. ONSET: "When did the cough begin?"      3 days 2. SEVERITY: "How bad is the cough today?"   Makes her chest hurt from cough 3. SPUTUM: "Describe the color of your sputum" (none, dry cough; clear, white, yellow, green)     green 4. HEMOPTYSIS: "Are you coughing up any blood?" If so ask: "How much?" (flecks, streaks, tablespoons, etc.)     no 5. DIFFICULTY BREATHING: "Are you having difficulty breathing?" If Yes, ask: "How bad is it?" (e.g., mild, moderate, severe)    - MILD: No SOB at rest, mild SOB with walking, speaks normally in sentences, can lie down, no retractions, pulse < 100.    - MODERATE: SOB at rest, SOB with minimal exertion and prefers to sit, cannot lie down flat, speaks in phrases, mild retractions, audible wheezing, pulse 100-120.    - SEVERE: Very SOB at rest, speaks in single words, struggling to breathe, sitting hunched forward, retractions, pulse > 120      no 6. FEVER: "Do you have a fever?" If Yes, ask: "What is your temperature, how was it measured, and when did it start?"     no 7. CARDIAC HISTORY: "Do you have any history of heart disease?" (e.g., heart attack, congestive heart failure)      no 8. LUNG HISTORY: "Do you have any history of lung disease?"  (e.g., pulmonary embolus,  asthma, emphysema)     no 9. PE RISK FACTORS: "Do you have a history of blood clots?" (or: recent major surgery, recent prolonged travel, bedridden)      10. OTHER SYMPTOMS: "Do you have any other symptoms?" (e.g., runny nose, wheezing, chest pain)        11. PREGNANCY: "Is there any chance you are pregnant?" "When was your last menstrual period?"        12. TRAVEL: "Have you traveled out of the country in the last month?" (e.g., travel history, exposures)  Protocols used: Cough - Acute Productive-A-AH

## 2022-06-28 NOTE — Patient Instructions (Signed)
  Acute Bronchitis, Adult  Acute bronchitis is when air tubes in the lungs (bronchi) suddenly get swollen. The condition can make it hard for you to breathe. In adults, acute bronchitis usually goes away within 2 weeks. A cough caused by bronchitis may last up to 3 weeks. Smoking, allergies, and asthma can make the condition worse. What are the causes? Germs that cause cold and flu (viruses). The most common cause of this condition is the virus that causes the common cold. Bacteria. Substances that bother (irritate) the lungs, including: Smoke from cigarettes and other types of tobacco. Dust and pollen. Fumes from chemicals, gases, or burned fuel. Indoor or outdoor air pollution. What increases the risk? A weak body's defense system. This is also called the immune system. Any condition that affects your lungs and breathing, such as asthma. What are the signs or symptoms? A cough. Coughing up clear, yellow, or green mucus. Making high-pitched whistling sounds when you breathe, most often when you breathe out (wheezing). Runny or stuffy nose. Having too much mucus in your lungs (chest congestion). Shortness of breath. Body aches. A sore throat. How is this treated? Acute bronchitis may go away over time without treatment. Your doctor may tell you to: Drink more fluids. This will help thin your mucus so it is easier to cough up. Use a device that gets medicine into your lungs (inhaler). Use a vaporizer or a humidifier. These are machines that add water to the air. This helps with coughing and poor breathing. Take a medicine that thins mucus and helps clear it from your lungs. Take a medicine that prevents or stops coughing. It is not common to take an antibiotic medicine for this condition. Follow these instructions at home:  Take over-the-counter and prescription medicines only as told by your doctor. Use an inhaler, vaporizer, or humidifier as told by your doctor. Take two  teaspoons (10 mL) of honey at bedtime. This helps lessen your coughing at night. Drink enough fluid to keep your pee (urine) pale yellow. Do not smoke or use any products that contain nicotine or tobacco. If you need help quitting, ask your doctor. Get a lot of rest. Return to your normal activities when your doctor says that it is safe. Keep all follow-up visits. How is this prevented?  Wash your hands often with soap and water for at least 20 seconds. If you cannot use soap and water, use hand sanitizer. Avoid contact with people who have cold symptoms. Try not to touch your mouth, nose, or eyes with your hands. Avoid breathing in smoke or chemical fumes. Make sure to get the flu shot every year. Contact a doctor if: Your symptoms do not get better in 2 weeks. You have trouble coughing up the mucus. Your cough keeps you awake at night. You have a fever. Get help right away if: You cough up blood. You have chest pain. You have very bad shortness of breath. You faint or keep feeling like you are going to faint. You have a very bad headache. Your fever or chills get worse. These symptoms may be an emergency. Get help right away. Call your local emergency services (911 in the [place] Do not wait to see if the symptoms will go away. Do not drive yourself to the hospital. Summary Acute bronchitis is when air tubes in the lungs (bronchi) suddenly get swollen. In adults, acute bronchitis usually goes away within 2 weeks. Drink more fluids. This will help thin your mucus so it   is easier to cough up. Take over-the-counter and prescription medicines only as told by your doctor. Contact a doctor if your symptoms do not improve after 2 weeks of treatment. This information is not intended to replace advice given to you by your health care provider. Make sure you discuss any questions you have with your health care provider. Document Revised: 12/31/2020 Document Reviewed: 12/31/2020 Elsevier  Patient Education  2023 [employer] Inc.  

## 2022-06-28 NOTE — Progress Notes (Signed)
Subjective:    Patient ID: Gregary Cromer, female    DOB: [birth year], 41 y.o.   MRN: [id number]  HPI  Patient presents to clinic today with complaint of sore throat, cough and chest congestion.  This started 1 week ago.  She denies difficulty swallowing.  The cough is productive of green mucous.  She denies headache, runny nose, nasal congestion, ear pain, shortness of breath, chest pain, nausea, vomiting or diarrhea.  She denies fever, chills or body aches.  She has tried multiple OTC medications with minimal relief of symptoms. She has taken multiple covid test which were negative. She has a history of allergies managed with Xyzal and Montelukast.  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 90 tablet 1   Butalbital-APAP-Caffeine 50-300-40 MG CAPS Take 1 capsule by mouth every 4 (four) hours as needed.     cetirizine (ZYRTEC) 10 MG tablet Take 1 tablet (10 mg total) by mouth daily. 90 tablet 3   cyclobenzaprine (FLEXERIL) 5 MG tablet Take 1 tablet (5 mg total) by mouth 3 (three) times daily as needed for muscle spasms. 30 tablet 0   levocetirizine (XYZAL) 5 MG tablet TAKE 1 TABLET BY MOUTH ONCE A DAY EVERY EVENING. 90 tablet 1   levonorgestrel-ethinyl estradiol (SEASONALE) 0.15-0.03 MG tablet Take 1 tablet by mouth daily.     montelukast (SINGULAIR) 10 MG tablet Take 1 tablet (10 mg total) by mouth at bedtime. 90 tablet 1   naproxen (NAPROSYN) 500 MG tablet Take 1 tablet (500 mg total) by mouth daily as needed for headache. 30 tablet 2   ondansetron (ZOFRAN-ODT) 4 MG  disintegrating tablet Take 1 tablet (4 mg total) by mouth every 8 (eight) hours as needed for nausea or vomiting. 30 tablet 0   predniSONE (DELTASONE) 10 MG tablet Take 6 tabs on day 1, 5 tabs on day 2, 4 tabs on day 3, 3 tabs on day 4, 2 tabs on day 5, 1 tab on day 6 21 tablet 0   tirzepatide (MOUNJARO) 2.5 MG/0.5ML Pen Inject 2.5 mg into the skin once a week.     Vitamins/Minerals TABS Take by mouth.     No current facility-administered medications for this visit.    Allergies  Allergen Reactions   Iodine Itching and Swelling   Ivp Dye [Iodinated Contrast Media] Anaphylaxis    Dripped IVP dye on her arm after taking an IV out of a patient and had angioedema within 15 mins.    Omeprazole Anaphylaxis and Shortness Of Breath   Penicillins Anaphylaxis and Shortness Of Breath    Has patient had a PCN reaction causing immediate rash, facial/tongue/throat swelling, SOB or lightheadedness with hypotension: Yes Has patient had a PCN reaction causing severe rash involving mucus membranes or skin necrosis: No Has patient had a PCN reaction that required hospitalization: Unknown Has patient had a PCN reaction occurring within the last 10 years: No If all of the above answers are "NO", then may proceed with Cephalosporin use.    Shellfish Allergy Anaphylaxis  and Shortness Of Breath   Soap Itching    ANYTHING WITH FRAGRANCE    Family History  Problem Relation Age of Onset   Arthritis Mother    Ovarian cancer Mother    Hyperlipidemia Mother    Hypertension Mother    Irritable bowel syndrome Mother    Arthritis Father    Hypertension Father    Asthma Father    Asthma Sister    Arthritis Paternal Grandmother     Social History   Socioeconomic History   Marital status: Married    Spouse name: Lonnie   Number of children: 0   Years of education: Not on file   Highest education level: Not on file  Occupational History   Occupation: CNA/ CPT  Tobacco Use   Smoking status: Never    Smokeless tobacco: Never  Vaping Use   Vaping Use: Never used  Substance and Sexual Activity   Alcohol use: Not Currently    Alcohol/week: 0.0 standard drinks of alcohol    Comment: occasionally   Drug use: No   Sexual activity: Yes    Birth control/protection: Pill  Other Topics Concern   Not on file  Social History Narrative   Not on file   Social Determinants of Health   Financial Resource Strain: Not on file  Food Insecurity: Not on file  Transportation Needs: Not on file  Physical Activity: Not on file  Stress: Not on file  Social Connections: Not on file  Intimate Partner Violence: Not on file     Constitutional: Denies fever, malaise, fatigue, headache or abrupt weight changes.  HEENT: Patient with sore throat.  Denies eye pain, eye redness, ear pain, ringing in the ears, wax buildup, runny nose, nasal congestion, bloody nose. Respiratory: Patient reports cough.  Denies difficulty breathing, shortness of breath.   Cardiovascular: Denies chest pain, chest tightness, palpitations or swelling in the hands or feet.  Gastrointestinal: Denies abdominal pain, bloating, constipation, diarrhea or blood in the stool.   No other specific complaints in a complete review of systems (except as listed in HPI above).  Objective:   Physical Exam BP 136/74   Pulse (!) 108   Temp (!) 97.1 F (36.2 C) (Temporal)   Wt 228 lb (103.4 kg)   SpO2 99%   BMI 36.25 kg/m   Wt Readings from Last 3 Encounters:  02/18/22 233 lb (105.7 kg)  02/04/22 229 lb (103.9 kg)  11/13/21 237 lb (107.5 kg)    General: Appears her stated age, obese, in NAD. HEENT: Head: normal shape and size; Eyes: sclera white, no icterus, conjunctiva pink, PERRLA and EOMs intact; Throat/Mouth: Teeth present, mucosa pink and moist, no exudate, lesions or ulcerations noted.  Neck: No adenopathy noted. Cardiovascular: Tachycardic with normal rhythm. S1,S2 noted.  No murmur, rubs or gallops noted.  Pulmonary/Chest:  Normal effort and positive vesicular breath sounds. No respiratory distress. No wheezes, rales or ronchi noted.  Neurological: Alert and oriented.  BMET    Component Value Date/Time   NA 141 02/04/2022 1418   NA 137 01/02/2014 1152   K 4.1 02/04/2022 1418   K 3.9 01/02/2014 1152   CL 105 02/04/2022 1418   CL 102 01/02/2014 1152   CO2 26 02/04/2022 1418   CO2 26 01/02/2014 1152   GLUCOSE 95 02/04/2022 1418   GLUCOSE 105 (H) 01/02/2014 1152   BUN 5 (L) 02/04/2022 1418   BUN 9 01/02/2014 1152   CREATININE 0.67 02/04/2022 1418   CALCIUM 9.4 02/04/2022  1418   CALCIUM 8.8 01/02/2014 1152   GFRNONAA >60 02/23/2019 1741   GFRNONAA >60 01/02/2014 1152   GFRAA >60 02/23/2019 1741   GFRAA >60 01/02/2014 1152    Lipid Panel     Component Value Date/Time   CHOL 164 02/04/2022 1418   TRIG 98 02/04/2022 1418   HDL 47 (L) 02/04/2022 1418   CHOLHDL 3.5 02/04/2022 1418   VLDL 15.0 09/18/2020 1108   LDLCALC 98 02/04/2022 1418    CBC    Component Value Date/Time   WBC 12.4 (H) 02/04/2022 1418   RBC 4.58 02/04/2022 1418   HGB 13.5 02/04/2022 1418   HGB 14.5 01/02/2014 1152   HCT 40.9 02/04/2022 1418   HCT 43.6 01/02/2014 1152   PLT 313 02/04/2022 1418   PLT 280 01/02/2014 1152   MCV 89.3 02/04/2022 1418   MCV 90 01/02/2014 1152   MCH 29.5 02/04/2022 1418   MCHC 33.0 02/04/2022 1418   RDW 12.7 02/04/2022 1418   RDW 12.7 01/02/2014 1152   LYMPHSABS 2.7 07/20/2016 1101   LYMPHSABS 2.4 01/02/2014 1152   MONOABS 0.7 07/20/2016 1101   MONOABS 0.7 01/02/2014 1152   EOSABS 0.6 07/20/2016 1101   EOSABS 0.4 01/02/2014 1152   BASOSABS 0.0 07/20/2016 1101   BASOSABS 0.1 01/02/2014 1152    Hgb A1C Lab Results  Component Value Date   HGBA1C 5.2 02/04/2022          Assessment & Plan:   Acute Bacterial Bronchitis:  Encourage rest and fluids Rx for Azithromycin 250 mg x 5 days Rx for Prd taper x6 days Can use OTC cough syrup such as Delsym and NyQuil as needed for  cough  RTC in 2 months for follow-up of chronic conditions Webb Silversmith, NP

## 2022-07-14 ENCOUNTER — Ambulatory Visit: Payer: Managed Care, Other (non HMO) | Admitting: Internal Medicine

## 2022-07-14 ENCOUNTER — Encounter: Payer: Self-pay | Admitting: Internal Medicine

## 2022-07-14 DIAGNOSIS — F418 Other specified anxiety disorders: Secondary | ICD-10-CM

## 2022-07-14 DIAGNOSIS — Z9109 Other allergy status, other than to drugs and biological substances: Secondary | ICD-10-CM | POA: Diagnosis not present

## 2022-07-14 DIAGNOSIS — E6609 Other obesity due to excess calories: Secondary | ICD-10-CM

## 2022-07-14 DIAGNOSIS — R519 Headache, unspecified: Secondary | ICD-10-CM

## 2022-07-14 DIAGNOSIS — Z6835 Body mass index (BMI) 35.0-35.9, adult: Secondary | ICD-10-CM

## 2022-07-14 NOTE — Assessment & Plan Note (Signed)
Continue singulair, zyrec and zyrtec

## 2022-07-14 NOTE — Progress Notes (Signed)
Subjective:    Patient ID: Mikayla Briggs, female    DOB: 17-Nov-1980, 41 y.o.   MRN: [id number]  HPI  Patient presents to clinic today for follow-up of chronic conditions.  Migraines: These occur 1-2 times per month.  Triggered by stress, allergies.  She is taking Amitriptyline as prescribed.  She takes Fioricet as needed for breakthrough.  She does not follow with neurology.  Anxiety: Situational.  She is not currently taking any medications for this at this time.  She is not currently seeing a therapist.  She denies depression, SI/HI.  Environmental Allergies: Chronic, managed on Singulair, Xyzal and Zyrtec.  She does  not follow with an allergist.  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 90 tablet 1   azithromycin (ZITHROMAX) 250 MG tablet Take 2 tabs today, then 1 tab daily x 4 days 6 tablet 0   Butalbital-APAP-Caffeine 50-300-40 MG CAPS Take 1 capsule by mouth every 4 (four) hours as needed.     cetirizine (ZYRTEC) 10 MG tablet Take 1 tablet (10 mg total) by mouth daily. 90 tablet 3   cyclobenzaprine (FLEXERIL) 5 MG tablet Take 1 tablet (5 mg total) by mouth 3 (three) times daily as needed for muscle spasms. 30 tablet 0   levocetirizine (XYZAL) 5 MG tablet TAKE 1 TABLET BY MOUTH ONCE A DAY EVERY EVENING. 90 tablet 1   levonorgestrel-ethinyl estradiol (SEASONALE) 0.15-0.03 MG tablet Take 1 tablet by mouth daily.     montelukast (SINGULAIR) 10 MG tablet Take 1 tablet (10 mg total) by mouth at bedtime. 90 tablet 1   naproxen (NAPROSYN) 500 MG tablet Take 1 tablet  (500 mg total) by mouth daily as needed for headache. 30 tablet 2   ondansetron (ZOFRAN-ODT) 4 MG disintegrating tablet Take 1 tablet (4 mg total) by mouth every 8 (eight) hours as needed for nausea or vomiting. 30 tablet 0   predniSONE (DELTASONE) 10 MG tablet Take 6 tabs on day 1, 5 tabs on day 2, 4 tabs on day 3, 3 tabs on day 4, 2 tabs on day 5, 1 tab on day 6 21 tablet 0   Vitamins/Minerals TABS Take by mouth.     No current facility-administered medications for this visit.    Allergies  Allergen Reactions   Iodine Itching and Swelling   Ivp Dye [Iodinated Contrast Media] Anaphylaxis    Dripped IVP dye on her arm after taking an IV out of a patient and had angioedema within 15 mins.    Omeprazole Anaphylaxis and Shortness Of Breath   Penicillins Anaphylaxis and Shortness Of Breath    Has patient had a PCN reaction causing immediate rash, facial/tongue/throat swelling, SOB or lightheadedness with hypotension: Yes Has patient had a PCN reaction causing severe rash involving mucus membranes or skin necrosis: No Has patient had a PCN reaction that required hospitalization: Unknown Has patient had a PCN reaction occurring within the last 10 years: No If all of the above answers are "NO", then may proceed with Cephalosporin use.  Shellfish Allergy Anaphylaxis and Shortness Of Breath   Soap Itching    ANYTHING WITH FRAGRANCE    Family History  Problem Relation Age of Onset   Arthritis Mother    Ovarian cancer Mother    Hyperlipidemia Mother    Hypertension Mother    Irritable bowel syndrome Mother    Arthritis Father    Hypertension Father    Asthma Father    Asthma Sister    Arthritis Paternal Grandmother     Social History   Socioeconomic History   Marital status: Married    Spouse name: Lonnie   Number of children: 0   Years of education: Not on file   Highest education level: Not on file  Occupational History   Occupation: CNA/ CPT  Tobacco Use   Smoking status:  Never   Smokeless tobacco: Never  Vaping Use   Vaping Use: Never used  Substance and Sexual Activity   Alcohol use: Not Currently    Alcohol/week: 0.0 standard drinks of alcohol    Comment: occasionally   Drug use: No   Sexual activity: Yes    Birth control/protection: Pill  Other Topics Concern   Not on file  Social History Narrative   Not on file   Social Determinants of Health   Financial Resource Strain: Not on file  Food Insecurity: Not on file  Transportation Needs: Not on file  Physical Activity: Not on file  Stress: Not on file  Social Connections: Not on file  Intimate Partner Violence: Not on file     Constitutional: Patient reports intermittent headaches.  Denies fever, malaise, fatigue, or abrupt weight changes.  HEENT: Denies eye pain, eye redness, ear pain, ringing in the ears, wax buildup, runny nose, nasal congestion, bloody nose, or sore throat. Respiratory: Denies difficulty breathing, shortness of breath, cough or sputum production.   Cardiovascular: Denies chest pain, chest tightness, palpitations or swelling in the hands or feet.  Gastrointestinal: Denies abdominal pain, bloating, constipation, diarrhea or blood in the stool.  GU: Denies urgency, frequency, pain with urination, burning sensation, blood in urine, odor or discharge. Musculoskeletal: Pt reports right side neck pain. Denies decrease in range of motion, difficulty with gait, or joint swelling.  Skin: Denies redness, rashes, lesions or ulcercations.  Neurological: Denies dizziness, difficulty with memory, difficulty with speech or problems with balance and coordination.  Psych: Patient has a history of anxiety.  Denies depression, SI/HI.  No other specific complaints in a complete review of systems (except as listed in HPI above).  Objective:   Physical Exam  BP 136/84 (BP Location: Right Arm, Patient Position: Sitting, Cuff Size: Normal)   Pulse 74   Temp (!) 96.9 F (36.1 C) (Temporal)    Wt 223 lb (101.2 kg)   SpO2 100%   BMI 35.45 kg/m   Wt Readings from Last 3 Encounters:  06/28/22 228 lb (103.4 kg)  02/18/22 233 lb (105.7 kg)  02/04/22 229 lb (103.9 kg)    General: Appears her stated age, obese, in NAD. Skin: Warm, dry and intact.  HEENT: Head: normal shape and size; Eyes: sclera white, no icterus, conjunctiva pink, PERRLA and EOMs intact;  Cardiovascular: Normal rate and rhythm. S1,S2 noted.  No murmur, rubs or gallops noted.  Pulmonary/Chest: Normal effort and positive vesicular breath sounds. No respiratory distress. No wheezes, rales or ronchi noted.  Musculoskeletal: Normal flexion, extension and rotation of the cervical spine. Pain with palpation over the cervical spine. Shoulder shrug equal. No difficulty with  gait.  Neurological: Alert and oriented.  Psychiatric: Mood and affect normal. Behavior is normal. Judgment and thought content normal.   BMET    Component Value Date/Time   NA 141 02/04/2022 1418   NA 137 01/02/2014 1152   K 4.1 02/04/2022 1418   K 3.9 01/02/2014 1152   CL 105 02/04/2022 1418   CL 102 01/02/2014 1152   CO2 26 02/04/2022 1418   CO2 26 01/02/2014 1152   GLUCOSE 95 02/04/2022 1418   GLUCOSE 105 (H) 01/02/2014 1152   BUN 5 (L) 02/04/2022 1418   BUN 9 01/02/2014 1152   CREATININE 0.67 02/04/2022 1418   CALCIUM 9.4 02/04/2022 1418   CALCIUM 8.8 01/02/2014 1152   GFRNONAA >60 02/23/2019 1741   GFRNONAA >60 01/02/2014 1152   GFRAA >60 02/23/2019 1741   GFRAA >60 01/02/2014 1152    Lipid Panel     Component Value Date/Time   CHOL 164 02/04/2022 1418   TRIG 98 02/04/2022 1418   HDL 47 (L) 02/04/2022 1418   CHOLHDL 3.5 02/04/2022 1418   VLDL 15.0 09/18/2020 1108   LDLCALC 98 02/04/2022 1418    CBC    Component Value Date/Time   WBC 12.4 (H) 02/04/2022 1418   RBC 4.58 02/04/2022 1418   HGB 13.5 02/04/2022 1418   HGB 14.5 01/02/2014 1152   HCT 40.9 02/04/2022 1418   HCT 43.6 01/02/2014 1152   PLT 313 02/04/2022  1418   PLT 280 01/02/2014 1152   MCV 89.3 02/04/2022 1418   MCV 90 01/02/2014 1152   MCH 29.5 02/04/2022 1418   MCHC 33.0 02/04/2022 1418   RDW 12.7 02/04/2022 1418   RDW 12.7 01/02/2014 1152   LYMPHSABS 2.7 07/20/2016 1101   LYMPHSABS 2.4 01/02/2014 1152   MONOABS 0.7 07/20/2016 1101   MONOABS 0.7 01/02/2014 1152   EOSABS 0.6 07/20/2016 1101   EOSABS 0.4 01/02/2014 1152   BASOSABS 0.0 07/20/2016 1101   BASOSABS 0.1 01/02/2014 1152    Hgb A1C Lab Results  Component Value Date   HGBA1C 5.2 02/04/2022           Assessment & Plan:   Right Side Neck Pain:  Encouraged stretching Massage or chiropractic care may be helpful   RTC in 6 months for your annual exam Webb Silversmith, NP

## 2022-07-14 NOTE — Assessment & Plan Note (Signed)
Not medicated Support offered 

## 2022-07-14 NOTE — Patient Instructions (Signed)

## 2022-07-14 NOTE — Assessment & Plan Note (Signed)
Encouraged diet and exercise for weight loss ?

## 2022-07-14 NOTE — Assessment & Plan Note (Signed)
Avoid triggers Continue amitriptyline and fioricet

## 2022-07-15 ENCOUNTER — Encounter: Payer: Self-pay | Admitting: Internal Medicine

## 2022-08-06 ENCOUNTER — Encounter: Payer: Self-pay | Admitting: Internal Medicine

## 2022-08-06 DIAGNOSIS — Z9109 Other allergy status, other than to drugs and biological substances: Secondary | ICD-10-CM

## 2022-08-09 MED ORDER — AMITRIPTYLINE HCL 25 MG PO TABS: 25.0000 mg | ORAL_TABLET | Freq: Every day | ORAL | 1 refills | Status: DC

## 2022-08-09 MED ORDER — MONTELUKAST SODIUM 10 MG PO TABS: 10.0000 mg | ORAL_TABLET | Freq: Every day | ORAL | 1 refills | Status: DC

## 2022-08-09 MED ORDER — LEVOCETIRIZINE DIHYDROCHLORIDE 5 MG PO TABS: ORAL_TABLET | ORAL | 1 refills | Status: DC

## 2022-08-12 ENCOUNTER — Ambulatory Visit: Payer: Managed Care, Other (non HMO) | Admitting: Internal Medicine

## 2022-08-26 ENCOUNTER — Other Ambulatory Visit: Payer: Self-pay | Admitting: Internal Medicine

## 2022-08-26 DIAGNOSIS — Z9109 Other allergy status, other than to drugs and biological substances: Secondary | ICD-10-CM

## 2022-08-26 NOTE — Telephone Encounter (Signed)
Medication Refill - Medication: levocetirizine (XYZAL) 5 MG tablet , montelukast (SINGULAIR) 10 MG tablet, amitriptyline (ELAVIL) 25 MG tablet   Pt stated she has reached out to the [facility], and they don't have her medication anymore. Pt stated she doesn't know why, but it was not filled when it was sent because she could not afford to pay for medications to be filled. She stated she needs a new Rx sent as soon as possible, she hasn't had medication for about 3 weeks.  Has the patient contacted their pharmacy? Yes.    (Agent: If yes, when and what did the pharmacy advise?)  Preferred Pharmacy (with phone number or street name):  [facility] - [place], [place] - [address]  [address] [place] [address]  Phone: [phone] Fax: [phone]  Hours: Not open 24 hours   Has the patient been seen for an appointment in the last year OR does the patient have an upcoming appointment? Yes.    Agent: Please be advised that RX refills may take up to 3 business days. We ask that you follow-up with your pharmacy.

## 2022-08-26 NOTE — Telephone Encounter (Signed)
Unable to refill per protocol, last refill by provider 08/09/22 for 90 and 1 refill. Will refuse.  Requested Prescriptions  Pending Prescriptions Disp Refills   montelukast (SINGULAIR) 10 MG tablet 90 tablet 1    Sig: Take 1 tablet (10 mg total) by mouth at bedtime.     Pulmonology:  Leukotriene Inhibitors Passed - 08/26/2022 12:46 PM      Passed - Valid encounter within last 12 months    Recent Outpatient Visits           1 month ago Environmental allergies   [facility] [place], Coralie Keens, NP   1 month ago Acute bronchitis, unspecified organism   [facility] [place], Coralie Keens, NP   6 months ago Left hip pain   [facility] [place], Coralie Keens, NP   6 months ago Encounter for general adult medical examination with abnormal findings   [facility] [place], Coralie Keens, NP   9 months ago Acute midline low back pain without sciatica   [facility] [place], Coralie Keens, NP       Future Appointments             In 5 months [place], Coralie Keens, NP [facility]             levocetirizine (XYZAL) 5 MG tablet 90 tablet 1    Sig: TAKE 1 TABLET BY MOUTH ONCE A DAY EVERY EVENING.     Ear, Nose, and Throat:  Antihistamines - levocetirizine dihydrochloride Passed - 08/26/2022 12:46 PM      Passed - Cr in normal range and within 360 days    Creat  Date Value Ref Range Status  02/04/2022 0.67 0.50 - 0.99 mg/dL Final   Creatinine, Urine  Date Value Ref Range Status  02/23/2019 15.47 mg/dL Final         Passed - eGFR is 10 or above and within 360 days    EGFR (African American)  Date Value Ref Range Status  01/02/2014 >60  Final   GFR calc Af Amer  Date Value Ref Range Status  02/23/2019 >60 >60 mL/min Final   EGFR (Non-African Amer.)  Date Value Ref Range Status  01/02/2014 >60  Final    Comment:    eGFR values <61m/min/1.73 m2 may be an indication of chronic kidney disease (CKD). Calculated  eGFR is useful in patients with stable renal function. The eGFR calculation will not be reliable in acutely ill patients when serum creatinine is changing rapidly. It is not useful in  patients on dialysis. The eGFR calculation may not be applicable to patients at the low and high extremes of body sizes, pregnant women, and vegetarians.    GFR calc non Af Amer  Date Value Ref Range Status  02/23/2019 >60 >60 mL/min Final   GFR  Date Value Ref Range Status  09/18/2020 100.39 >60.00 mL/min Final    Comment:    Calculated using the CKD-EPI Creatinine Equation (2021)   eGFR  Date Value Ref Range Status  02/04/2022 113 > OR = 60 mL/min/1.772mFinal    Comment:    The eGFR is based on the CKD-EPI 2021 equation. To calculate  the new eGFR from a previous Creatinine or Cystatin C result, go to https://www.kidney.org/professionals/ kdoqi/gfr%5Fcalculator          Passed - Valid encounter within last 12 months    Recent Outpatient Visits  1 month ago Environmental allergies   [facility] [place], Coralie Keens, NP   1 month ago Acute bronchitis, unspecified organism   [facility] [place], [place], [place]   6 months ago Left hip pain   [facility] [place], Coralie Keens, NP   6 months ago Encounter for general adult medical examination with abnormal findings    [facility] [place], [place], NP   9 months ago Acute midline low back pain without sciatica   [facility] [place], Coralie Keens, NP       Future Appointments             In 5 months [place], Coralie Keens, NP [facility]             amitriptyline (ELAVIL) 25 MG tablet 90 tablet 1    Sig: Take 1 tablet (25 mg total) by mouth at bedtime.     Psychiatry:  Antidepressants - Heterocyclics (TCAs) Passed - 08/26/2022 12:46 PM      Passed - Valid encounter within last 6 months    Recent Outpatient Visits           1 month ago  Environmental allergies   [facility] [place], Coralie Keens, NP   1 month ago Acute bronchitis, unspecified organism   [facility] [place], Coralie Keens, NP   6 months ago Left hip pain   [facility] [place], Coralie Keens, NP   6 months ago Encounter for general adult medical examination with abnormal findings   [facility] [place], Coralie Keens, NP   9 months ago Acute midline low back pain without sciatica   [facility] [place], Coralie Keens, NP       Future Appointments             In 5 months Baity, Coralie Keens, NP [facility]

## 2022-09-12 ENCOUNTER — Encounter (HOSPITAL_BASED_OUTPATIENT_CLINIC_OR_DEPARTMENT_OTHER): Payer: Self-pay

## 2022-09-12 ENCOUNTER — Other Ambulatory Visit: Payer: Self-pay

## 2022-09-12 ENCOUNTER — Emergency Department (HOSPITAL_BASED_OUTPATIENT_CLINIC_OR_DEPARTMENT_OTHER): Payer: Managed Care, Other (non HMO)

## 2022-09-12 ENCOUNTER — Emergency Department (HOSPITAL_BASED_OUTPATIENT_CLINIC_OR_DEPARTMENT_OTHER)
Admission: EM | Admit: 2022-09-12 | Discharge: 2022-09-13 | Disposition: A | Discharge status: Home / Self Care | Payer: Managed Care, Other (non HMO) | Attending: Emergency Medicine | Admitting: Emergency Medicine

## 2022-09-12 DIAGNOSIS — R059 Cough, unspecified: Secondary | ICD-10-CM | POA: Diagnosis present

## 2022-09-12 DIAGNOSIS — R5383 Other fatigue: Secondary | ICD-10-CM | POA: Insufficient documentation

## 2022-09-12 DIAGNOSIS — Z1152 Encounter for screening for COVID-19: Secondary | ICD-10-CM | POA: Diagnosis not present

## 2022-09-12 DIAGNOSIS — A419 Sepsis, unspecified organism: Secondary | ICD-10-CM | POA: Diagnosis not present

## 2022-09-12 DIAGNOSIS — J189 Pneumonia, unspecified organism: Secondary | ICD-10-CM | POA: Insufficient documentation

## 2022-09-12 LAB — BRAIN NATRIURETIC PEPTIDE: B Natriuretic Peptide: 23.2 pg/mL (ref 0.0–100.0)

## 2022-09-12 LAB — COMPREHENSIVE METABOLIC PANEL
ALT: 14 U/L (ref 0–44)
AST: 17 U/L (ref 15–41)
Albumin: 4.1 g/dL (ref 3.5–5.0)
Alkaline Phosphatase: 52 U/L (ref 38–126)
Anion gap: 10 (ref 5–15)
BUN: 8 mg/dL (ref 6–20)
CO2: 25 mmol/L (ref 22–32)
Calcium: 9.3 mg/dL (ref 8.9–10.3)
Chloride: 101 mmol/L (ref 98–111)
Creatinine, Ser: 0.74 mg/dL (ref 0.44–1.00)
GFR, Estimated: >60 mL/min (ref >60)
Glucose, Bld: 113 mg/dL — ABNORMAL HIGH (ref 70–99)
Potassium: 3.5 mmol/L (ref 3.5–5.1)
Sodium: 136 mmol/L (ref 135–145)
Total Bilirubin: 0.5 mg/dL (ref 0.3–1.2)
Total Protein: 7.1 g/dL (ref 6.5–8.1)

## 2022-09-12 LAB — CBC WITH DIFFERENTIAL/PLATELET
Abs Immature Granulocytes: 0.08 10*3/uL — ABNORMAL HIGH (ref 0.00–0.07)
Basophils Absolute: 0 10*3/uL (ref 0.0–0.1)
Basophils Relative: 0 %
Eosinophils Absolute: 0.1 10*3/uL (ref 0.0–0.5)
Eosinophils Relative: 1 %
HCT: 41 % (ref 36.0–46.0)
Hemoglobin: 13.8 g/dL (ref 12.0–15.0)
Immature Granulocytes: 0 %
Lymphocytes Relative: 5 %
Lymphs Abs: 0.8 10*3/uL (ref 0.7–4.0)
MCH: 30.4 pg (ref 26.0–34.0)
MCHC: 33.7 g/dL (ref 30.0–36.0)
MCV: 90.3 fL (ref 80.0–100.0)
Monocytes Absolute: 1.1 10*3/uL — ABNORMAL HIGH (ref 0.1–1.0)
Monocytes Relative: 6 %
Neutro Abs: 16.8 10*3/uL — ABNORMAL HIGH (ref 1.7–7.7)
Neutrophils Relative %: 88 %
Platelets: 313 10*3/uL (ref 150–400)
RBC: 4.54 MIL/uL (ref 3.87–5.11)
RDW: 12.4 % (ref 11.5–15.5)
WBC: 18.9 10*3/uL — ABNORMAL HIGH (ref 4.0–10.5)
nRBC: 0 % (ref 0.0–0.2)

## 2022-09-12 LAB — RESP PANEL BY RT-PCR (RSV, FLU A&B, COVID)  RVPGX2
Influenza A by PCR: NEGATIVE
Influenza B by PCR: NEGATIVE
Resp Syncytial Virus by PCR: NEGATIVE
SARS Coronavirus 2 by RT PCR: NEGATIVE

## 2022-09-12 LAB — TROPONIN I (HIGH SENSITIVITY): Troponin I (High Sensitivity): 4 ng/L (ref <18)

## 2022-09-12 LAB — LACTIC ACID, PLASMA: Lactic Acid, Venous: 2 mmol/L (ref 0.5–1.9)

## 2022-09-12 MED ORDER — LACTATED RINGERS IV BOLUS (SEPSIS)
1000.0000 mL | Freq: Once | INTRAVENOUS | Status: AC
  Administered 2022-09-13: 1000 mL via INTRAVENOUS

## 2022-09-12 MED ORDER — LACTATED RINGERS IV SOLN: INTRAVENOUS | Status: DC

## 2022-09-12 MED ORDER — LACTATED RINGERS IV BOLUS (SEPSIS)
1000.0000 mL | Freq: Once | INTRAVENOUS | Status: AC
  Administered 2022-09-12: 1000 mL via INTRAVENOUS

## 2022-09-12 MED ORDER — SODIUM CHLORIDE 0.9 % IV SOLN
500.0000 mg | Freq: Once | INTRAVENOUS | Status: AC
  Administered 2022-09-12: 500 mg via INTRAVENOUS
  Filled 2022-09-12: qty 5

## 2022-09-12 MED ORDER — LEVOFLOXACIN IN D5W 750 MG/150ML IV SOLN: 750.0000 mg | Freq: Once | INTRAVENOUS | Status: DC

## 2022-09-12 MED ORDER — SODIUM CHLORIDE 0.9 % IV SOLN
2.0000 g | Freq: Once | INTRAVENOUS | Status: AC
  Administered 2022-09-12: 2 g via INTRAVENOUS
  Filled 2022-09-12: qty 20

## 2022-09-12 MED ORDER — LACTATED RINGERS IV BOLUS (SEPSIS)
500.0000 mL | Freq: Once | INTRAVENOUS | Status: AC
  Administered 2022-09-13: 500 mL via INTRAVENOUS

## 2022-09-12 MED ORDER — LACTATED RINGERS IV BOLUS
500.0000 mL | Freq: Once | INTRAVENOUS | Status: AC
  Administered 2022-09-12: 500 mL via INTRAVENOUS

## 2022-09-12 MED ORDER — ACETAMINOPHEN 325 MG PO TABS
650.0000 mg | ORAL_TABLET | Freq: Once | ORAL | Status: DC | PRN
  Filled 2022-09-12: qty 2

## 2022-09-12 MED ORDER — BENZONATATE 100 MG PO CAPS
200.0000 mg | ORAL_CAPSULE | Freq: Once | ORAL | Status: AC
  Administered 2022-09-12: 200 mg via ORAL
  Filled 2022-09-12: qty 2

## 2022-09-12 NOTE — ED Notes (Signed)
Dr. Donnald Garre aware of lactic acid of 2.0

## 2022-09-12 NOTE — ED Notes (Signed)
Staff alerted to pt calling for help in the lobby. Pt reassessed, found laying on her side, tearful & stating she felt she was going to pass out. This RN assessed pt, found to be in NAD. Full set of vitals obtained, charge RN notified.

## 2022-09-12 NOTE — ED Provider Notes (Signed)
[facility] EMERGENCY DEPT Provider Note   CSN: [id number] Arrival date & time: 09/12/22  1846     History {Add pertinent medical, surgical, social history, OB history to HPI:1} Chief Complaint  Patient presents with   Cough    Mikayla Briggs is a 41 y.o. female.  HPI     Home Medications Prior to Admission medications   Medication Sig Start Date End Date Taking? Authorizing Provider  amitriptyline (ELAVIL) 25 MG tablet Take 1 tablet (25 mg total) by mouth at bedtime. 08/09/22   Lorre Munroe, NP  Butalbital-APAP-Caffeine 50-300-40 MG CAPS Take 1 capsule by mouth every 4 (four) hours as needed.    [provider]  cetirizine (ZYRTEC) 10 MG tablet Take 1 tablet (10 mg total) by mouth daily. 07/21/18   Lorre Munroe, NP  cyclobenzaprine (FLEXERIL) 5 MG tablet Take 1 tablet (5 mg total) by mouth 3 (three) times daily as needed for muscle spasms. 11/13/21   Lorre Munroe, NP  levocetirizine (XYZAL) 5 MG tablet TAKE 1 TABLET BY MOUTH ONCE A DAY EVERY EVENING. 08/09/22   Lorre Munroe, NP  levonorgestrel-ethinyl estradiol (SEASONALE) 0.15-0.03 MG tablet Take 1 tablet by mouth daily.    [provider]  montelukast (SINGULAIR) 10 MG tablet Take 1 tablet (10 mg total) by mouth at bedtime. 08/09/22   Lorre Munroe, NP  naproxen (NAPROSYN) 500 MG tablet Take 1 tablet (500 mg total) by mouth daily as needed for headache. 01/27/21   Lorre Munroe, NP  ondansetron (ZOFRAN-ODT) 4 MG disintegrating tablet Take 1 tablet (4 mg total) by mouth every 8 (eight) hours as needed for nausea or vomiting. 06/10/22   Lorre Munroe, NP  Vitamins/Minerals TABS Take by mouth.    [provider]      Allergies    Iodine, Ivp dye [iodinated contrast media], Omeprazole, Penicillins, Shellfish allergy, and Soap    Review of Systems   Review of Systems  Physical Exam Updated Vital Signs BP (!) 140/85   Pulse (!) 133   Temp 99.1 F (37.3  C) (Oral)   Resp (!) 27   Ht 5' 6.5" (1.689 m)   Wt 101.2 kg   SpO2 94%   BMI 35.47 kg/m  Physical Exam  ED Results / Procedures / Treatments   Labs (all labs ordered are listed, but only abnormal results are displayed) Labs Reviewed  RESP PANEL BY RT-PCR (RSV, FLU A&B, COVID)  [id number]  CBC WITH DIFFERENTIAL/PLATELET  COMPREHENSIVE METABOLIC PANEL    EKG EKG Interpretation  Date/Time:  Sunday September 12 2022 18:58:16 EST Ventricular Rate:  156 PR Interval:  128 QRS Duration: 78 QT Interval:  250 QTC Calculation: 402 R Axis:   -63 Text Interpretation: Sinus tachycardia Left anterior fascicular block poor r wave progression, likely rate related change Confirmed by Arby Barrette [phone]) on 09/12/2022 9:36:34 PM  Radiology DG Chest Portable 1 View  Result Date: 09/12/2022 CLINICAL DATA:  Cough x3 days. EXAM: PORTABLE CHEST 1 VIEW COMPARISON:  September 09, 2017 FINDINGS: The heart size and mediastinal contours are within normal limits. Low lung volumes are noted. Mild atelectasis is seen in the bilateral lung bases, left slightly greater than right. There is no evidence of a pleural effusion or pneumothorax. The visualized skeletal structures are unremarkable. IMPRESSION: Low lung volumes with mild bibasilar atelectasis, left slightly greater than right. Electronically Signed   By: Aram Candela M.D.   On: 09/12/2022 20:09    Procedures  Procedures  {Document cardiac monitor, telemetry assessment procedure when appropriate:1}  Medications Ordered in ED Medications  acetaminophen (TYLENOL) tablet 650 mg (has no administration in time range)    ED Course/ Medical Decision Making/ A&P                           Medical Decision Making Amount and/or Complexity of Data Reviewed Labs: ordered. Radiology: ordered.  Risk OTC drugs.   ***  {Document critical care time when appropriate:1} {Document review of labs and clinical decision tools ie heart score,  Chads2Vasc2 etc:1}  {Document your independent review of radiology images, and any outside records:1} {Document your discussion with family members, caretakers, and with consultants:1} {Document social determinants of health affecting pt's care:1} {Document your decision making why or why not admission, treatments were needed:1} Final Clinical Impression(s) / ED Diagnoses Final diagnoses:  None    Rx / DC Orders ED Discharge Orders     None

## 2022-09-12 NOTE — ED Triage Notes (Signed)
Patient here POV from Home.  Endorses Cough for 3 Days. Patient is concerned for Bronchitis. Mostly Non-Productive.   NAD Noted during Triage. A&Ox4. GCS 15. Ambulatory.

## 2022-09-12 NOTE — Sepsis Progress Note (Signed)
Elink following for Sepsis Protocol 

## 2022-09-13 LAB — LACTIC ACID, PLASMA: Lactic Acid, Venous: 2.6 mmol/L (ref 0.5–1.9)

## 2022-09-13 MED ORDER — ONDANSETRON HCL 4 MG/2ML IJ SOLN
INTRAMUSCULAR | Status: AC
  Filled 2022-09-13: qty 2

## 2022-09-13 MED ORDER — AZITHROMYCIN 250 MG PO TABS: 250.0000 mg | ORAL_TABLET | Freq: Every day | ORAL | 0 refills | Status: DC

## 2022-09-13 MED ORDER — ONDANSETRON HCL 4 MG/2ML IJ SOLN
4.0000 mg | Freq: Once | INTRAMUSCULAR | Status: AC
  Administered 2022-09-13: 4 mg via INTRAVENOUS

## 2022-09-13 NOTE — ED Notes (Signed)
Dr. Johnney Killian aware of lactic of 2.6

## 2022-09-13 NOTE — ED Notes (Signed)
DC papers reviewed. Pt leaving AMA. This RN and Delo MD speak with patient about risks of leaving AMA. Pt refuses to stay and wished to be home to pick up child. Signs AMA form and assisted to lobby in wheelchair without incident.

## 2022-09-13 NOTE — ED Notes (Signed)
Pt desaturates to 88% on RA while laying down. Sat up and place on 2LPM via [place]. RTT notified. Saturation improves to 96%.

## 2022-09-13 NOTE — ED Provider Notes (Signed)
  Physical Exam  BP 114/77   Pulse (!) 110   Temp 97.9 F (36.6 C) (Oral)   Resp 19   Ht 5' 6.5" (1.689 m)   Wt 101.2 kg   SpO2 97%   BMI 35.47 kg/m   Physical Exam Vitals and nursing note reviewed.  Constitutional:      General: She is not in acute distress.    Appearance: Normal appearance. She is ill-appearing.  HENT:     Head: Normocephalic and atraumatic.  Pulmonary:     Effort: Pulmonary effort is normal.  Skin:    General: Skin is warm and dry.  Neurological:     Mental Status: She is alert.     Procedures  Procedures  ED Course / MDM  Care assumed from Dr. Vallery Ridge at shift change.  Patient initially presenting with 3 days of cough, fever, and feeling generally unwell.  She arrives here with fever, tachycardia, and elevated lactate concerning for sepsis.  Patient also has a leukocytosis and multifocal pneumonia on her chest x-ray.  Patient was given antibiotics and IV fluids per protocol.  She has been recommended admission, however has declined on multiple occasions.  I have explained to the patient that she is displaying signs and symptoms of sepsis and could ultimately lead to her death.  She understands these risks and wishes to leave [place].  She appears to have medical decision making capacity to refuse care.  I will prescribe Zithromax.  She tells me she will return if symptoms worsen.  She is unwilling to stay because she apparently has some sort of childcare issue.       Veryl Speak, MD 09/13/22 [phone]

## 2022-09-13 NOTE — Discharge Instructions (Signed)
Begin taking Zithromax as prescribed.  You are leaving [place] and understand that there is the risk of serious disability and death.  You are welcome to return at any time should you desire the care that was offered to you.

## 2022-09-15 ENCOUNTER — Telehealth: Payer: Self-pay | Admitting: Internal Medicine

## 2022-09-15 ENCOUNTER — Encounter: Payer: Self-pay | Admitting: Internal Medicine

## 2022-09-15 NOTE — Telephone Encounter (Signed)
called in wanted to know if she can do her ER appointment virtually due to her having to work and unable to get off and would like to have a call back.

## 2022-09-15 NOTE — Telephone Encounter (Signed)
Per Rollene Fare the provider it is okay for the pt to have a virtual appointment.  The pt was called and scheduled for 09/17/2022 at 11:20 w/Regina.

## 2022-09-17 ENCOUNTER — Encounter: Payer: Self-pay | Admitting: Internal Medicine

## 2022-09-17 ENCOUNTER — Telehealth (INDEPENDENT_AMBULATORY_CARE_PROVIDER_SITE_OTHER): Payer: Managed Care, Other (non HMO) | Admitting: Internal Medicine

## 2022-09-17 DIAGNOSIS — A419 Sepsis, unspecified organism: Secondary | ICD-10-CM | POA: Diagnosis not present

## 2022-09-17 DIAGNOSIS — J189 Pneumonia, unspecified organism: Secondary | ICD-10-CM

## 2022-09-17 NOTE — Progress Notes (Signed)
Virtual Visit via Video Note  I connected with Mikayla Briggs on 09/17/22 at 11:20 AM EST by a video enabled telemedicine application and verified that I am speaking with the correct person using two identifiers.  Location: Patient: Work Provider: Office  Persons participating in this video call: Webb Silversmith, and Rejeana Brock   I discussed the limitations of evaluation and management by telemedicine and the availability of in person appointments. The patient expressed understanding and agreed to proceed.  History of Present Illness:  Patient due for ER follow-up.  She presented to the ER 12/31 with complaint of respiratory symptoms.  Her chest x-ray showed low lung volumes.  CT chest was concerning for multifocal pneumonia.  Her labs revealed an elevated white count of 18.6 with a lactic acid of 2.6.  She was diagnosed with sepsis.  She was treated with IV fluids, IV azithromycin and IV Rocephin.  It was recommended that she be admitted for observation however she left [place]. She was discharged with Azithromycin x 5 days. Since that time, she reports her cough has improved but it still lingers. She is taking Delsym OTC as needed for cough.    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 90 tablet 1   azithromycin (ZITHROMAX) 250 MG tablet Take 1 tablet (250 mg total) by mouth daily. 4 tablet 0   Butalbital-APAP-Caffeine 50-300-40 MG CAPS Take 1 capsule by mouth every 4 (four) hours as needed.     cetirizine (ZYRTEC) 10 MG tablet Take 1 tablet (10 mg total) by mouth daily. 90  tablet 3   cyclobenzaprine (FLEXERIL) 5 MG tablet Take 1 tablet (5 mg total) by mouth 3 (three) times daily as needed for muscle spasms. 30 tablet 0   levocetirizine (XYZAL) 5 MG tablet TAKE 1 TABLET BY MOUTH ONCE A DAY EVERY EVENING. 90 tablet 1   levonorgestrel-ethinyl estradiol (SEASONALE) 0.15-0.03 MG tablet Take 1 tablet by mouth daily.     montelukast (SINGULAIR) 10 MG tablet Take 1 tablet (10 mg total) by mouth at bedtime. 90 tablet 1   naproxen (NAPROSYN) 500 MG tablet Take 1 tablet (500 mg total) by mouth daily as needed for headache. 30 tablet 2   ondansetron (ZOFRAN-ODT) 4 MG disintegrating tablet Take 1 tablet (4 mg total) by mouth every 8 (eight) hours as needed for nausea or vomiting. 30 tablet 0   Vitamins/Minerals TABS Take by mouth.     No current facility-administered medications for this visit.    Allergies  Allergen Reactions   Iodine Itching and Swelling   Ivp Dye [Iodinated Contrast Media] Anaphylaxis    Dripped IVP dye on her arm after taking an IV out of a patient and had angioedema within 15 mins.    Omeprazole Anaphylaxis and Shortness Of Breath   Penicillins Anaphylaxis and Shortness Of Breath    Has patient had a PCN reaction causing immediate rash, facial/tongue/throat swelling, SOB or lightheadedness with hypotension: Yes Has patient had a PCN reaction causing severe rash involving mucus membranes or skin necrosis: No Has patient had  a PCN reaction that required hospitalization: Unknown Has patient had a PCN reaction occurring within the last 10 years: No If all of the above answers are "NO", then may proceed with Cephalosporin use.    Shellfish Allergy Anaphylaxis and Shortness Of Breath   Soap Itching    ANYTHING WITH FRAGRANCE    Family History  Problem Relation Age of Onset   Arthritis Mother    Ovarian cancer Mother    Hyperlipidemia Mother    Hypertension Mother    Irritable bowel syndrome Mother    Arthritis Father    Hypertension Father     Asthma Father    Asthma Sister    Arthritis Paternal Grandmother     Social History   Socioeconomic History   Marital status: Legally Separated    Spouse name: Lonnie   Number of children: 0   Years of education: Not on file   Highest education level: Not on file  Occupational History   Occupation: CNA/ CPT  Tobacco Use   Smoking status: Never   Smokeless tobacco: Never  Vaping Use   Vaping Use: Never used  Substance and Sexual Activity   Alcohol use: Not Currently    Alcohol/week: 0.0 standard drinks of alcohol    Comment: occasionally   Drug use: No   Sexual activity: Yes    Birth control/protection: Pill  Other Topics Concern   Not on file  Social History Narrative   Not on file   Social Determinants of Health   Financial Resource Strain: Not on file  Food Insecurity: Not on file  Transportation Needs: Not on file  Physical Activity: Not on file  Stress: Not on file  Social Connections: Not on file  Intimate Partner Violence: Not on file     Constitutional: Denies fever, malaise, fatigue, headache or abrupt weight changes.  HEENT: Denies eye pain, eye redness, ear pain, ringing in the ears, wax buildup, runny nose, nasal congestion, bloody nose, or sore throat. Respiratory: Pt reports cough. Denies difficulty breathing, shortness of breath.   Cardiovascular: Denies chest pain, chest tightness, palpitations or swelling in the hands or feet.  Gastrointestinal: Denies abdominal pain, bloating, constipation, diarrhea or blood in the stool.   No other specific complaints in a complete review of systems (except as listed in HPI above).  Observations/Objective:   Wt Readings from Last 3 Encounters:  09/12/22 223 lb 1.7 oz (101.2 kg)  07/14/22 223 lb (101.2 kg)  06/28/22 228 lb (103.4 kg)    General: Appears her stated age, obese, in NAD. HEENT: Head: normal shape and size; Nose: no congestion noted ; Throat/Mouth: no hoarseness noted Pulmonary/Chest: Normal  effort. No respiratory distress.  Neurological: Alert and oriented.   BMET    Component Value Date/Time   NA 136 09/12/2022 2122   NA 137 01/02/2014 1152   K 3.5 09/12/2022 2122   K 3.9 01/02/2014 1152   CL 101 09/12/2022 2122   CL 102 01/02/2014 1152   CO2 25 09/12/2022 2122   CO2 26 01/02/2014 1152   GLUCOSE 113 (H) 09/12/2022 2122   GLUCOSE 105 (H) 01/02/2014 1152   BUN 8 09/12/2022 2122   BUN 9 01/02/2014 1152   CREATININE 0.74 09/12/2022 2122   CREATININE 0.67 02/04/2022 1418   CALCIUM 9.3 09/12/2022 2122   CALCIUM 8.8 01/02/2014 1152   GFRNONAA >60 09/12/2022 2122   GFRNONAA >60 01/02/2014 1152   GFRAA >60 02/23/2019 1741   GFRAA >60 01/02/2014 1152    Lipid  Panel     Component Value Date/Time   CHOL 164 02/04/2022 1418   TRIG 98 02/04/2022 1418   HDL 47 (L) 02/04/2022 1418   CHOLHDL 3.5 02/04/2022 1418   VLDL 15.0 09/18/2020 1108   LDLCALC 98 02/04/2022 1418    CBC    Component Value Date/Time   WBC 18.9 (H) 09/12/2022 2122   RBC 4.54 09/12/2022 2122   HGB 13.8 09/12/2022 2122   HGB 14.5 01/02/2014 1152   HCT 41.0 09/12/2022 2122   HCT 43.6 01/02/2014 1152   PLT 313 09/12/2022 2122   PLT 280 01/02/2014 1152   MCV 90.3 09/12/2022 2122   MCV 90 01/02/2014 1152   MCH 30.4 09/12/2022 2122   MCHC 33.7 09/12/2022 2122   RDW 12.4 09/12/2022 2122   RDW 12.7 01/02/2014 1152   LYMPHSABS 0.8 09/12/2022 2122   LYMPHSABS 2.4 01/02/2014 1152   MONOABS 1.1 (H) 09/12/2022 2122   MONOABS 0.7 01/02/2014 1152   EOSABS 0.1 09/12/2022 2122   EOSABS 0.4 01/02/2014 1152   BASOSABS 0.0 09/12/2022 2122   BASOSABS 0.1 01/02/2014 1152    Hgb A1C Lab Results  Component Value Date   HGBA1C 5.2 02/04/2022       Assessment and Plan:  ER follow-up for Multifocal Pneumonia with Sepsis:  ER notes, labs and imaging reviewed She has finished her course of antibiotics No indication for additional abx or steroids at this time Continue Delsym OTC as needed for  cough  RTC in 4 months for your annual exam  Follow Up Instructions:    I discussed the assessment and treatment plan with the patient. The patient was provided an opportunity to ask questions and all were answered. The patient agreed with the plan and demonstrated an understanding of the instructions.   The patient was advised to call back or seek an in-person evaluation if the symptoms worsen or if the condition fails to improve as anticipated.    Webb Silversmith, NP

## 2022-09-17 NOTE — Patient Instructions (Signed)
Sepsis, Diagnosis, Adult Sepsis is a serious bodily reaction to an infection. The infection that triggers sepsis may be from a bacteria, virus, or fungus. Sepsis can result from an infection in any part of your body. Infections that commonly lead to sepsis include skin, lung, and urinary tract infections. Sepsis is a medical emergency that must be treated right away in a hospital. In severe cases, it can lead to septic shock. Septic shock can weaken your heart and cause your blood pressure to drop. This can cause your central nervous system and your body's organs to stop working. What are the causes? This condition is caused by a severe reaction to infections from bacteria, viruses, or fungus. The germs that most often lead to sepsis include: Escherichia coli (E. coli) bacteria. Staphylococcus aureus (staph) bacteria. Some types of Streptococcus bacteria. The most common infections affect these organs: The lung (pneumonia). The kidneys or bladder (urinary tract infection). The skin (cellulitis). The bowel, gallbladder, or pancreas. What increases the risk? You are more likely to develop this condition if: Your body's disease-fighting system (immune system) is weakened. You are age 65 or older. You are female. You had surgery or you have been hospitalized. You have these devices inserted into your body: A small, thin tube (catheter). IV line. Breathing tube. Drainage tube. You are not getting enough nutrients from food (malnourished). You have a chronic disease, such as cancer, lung disease, kidney disease, or diabetes. What are the signs or symptoms? Symptoms of this condition may include: Fever. Chills or feeling very cold. Confusion or anxiety. Fatigue. Muscle aches. Shortness of breath or rapid breathing (hyperventilation). Nausea and vomiting. Urinating much less than usual. Fast heart rate. Changes in skin color. Your skin may look blotchy, pale, or blue. Cool, clammy, or  sweaty skin. Skin rash. Other symptoms depend on the source of your infection. How is this diagnosed? This condition is diagnosed based on your symptoms, medical history, and physical exam. Other tests may also be done to find out the cause of the infection and how severe the sepsis is. Tests may include: Blood tests. Urine tests. Swabs from other areas of your body that may have an infection. These samples may be tested (cultured) to find out what type of bacteria is causing the infection. Chest X-ray to check for pneumonia. Other imaging tests, such as a CT scan, may also be done. Lumbar puncture. This removes a small amount of the fluid that surrounds your brain and spinal cord. The fluid is then examined for infection. How is this treated? This condition must be treated in a hospital. Based on the cause of your infection, you may be given an antibiotic, antiviral, or antifungal medicine. You may also receive: Fluids through an IV. Oxygen and breathing assistance. Medicines to increase your blood pressure. Kidney dialysis. This process cleans your blood if your kidneys have failed. Surgery to remove infected tissue. Blood transfusion if needed. Medicine to prevent blood clots. Nutrients to correct imbalances in basic body function (metabolism). You may: Receive important salts and minerals (electrolytes) through an IV. Have your blood sugar level adjusted. Follow these instructions at home: Medicines  Take over-the-counter and prescription medicines only as told by your health care provider. If you were prescribed an antibiotic, antiviral, or antifungal medicine, take it as told by your health care provider. Do not stop taking the medicine even if you start to feel better. General instructions If you have a catheter or other indwelling device, ask to have it removed   as soon as possible. Keep all follow-up visits. This is important. Contact a health care provider if: You do not feel  like you are getting better or regaining strength. You are having trouble coping with your recovery. You frequently feel tired. You feel worse or do not seem to get better after surgery. You think you may have an infection after surgery. Get help right away if: You have any symptoms of sepsis. You have difficulty breathing. You have a rapid or skipping heartbeat. You become confused or disoriented. You have a high fever. Your skin becomes blotchy, pale, or blue. You have an infection that is getting worse or not getting better. These symptoms may represent a serious problem that is an emergency. Do not wait to see if the symptoms will go away. Get medical help right away. Call your local emergency services (911 in the [place] Do not drive yourself to the hospital. Summary Sepsis is a medical emergency that requires immediate treatment in a hospital. This condition is caused by a severe reaction to infections from bacteria, viruses, or fungus. Based on the cause of your infection, you may be given an antibiotic, antiviral, or antifungal medicine. Treatment may also include IV fluids, breathing assistance, and kidney dialysis. This information is not intended to replace advice given to you by your health care provider. Make sure you discuss any questions you have with your health care provider. Document Revised: 07/14/2020 Document Reviewed: 07/14/2020 Elsevier Patient Education  2023 [employer]  

## 2022-09-19 LAB — CULTURE, BLOOD (SINGLE): Culture: NO GROWTH

## 2022-11-22 ENCOUNTER — Encounter: Payer: Self-pay | Admitting: Internal Medicine

## 2022-11-22 ENCOUNTER — Telehealth: Payer: Managed Care, Other (non HMO) | Admitting: Internal Medicine

## 2022-11-22 ENCOUNTER — Other Ambulatory Visit: Payer: Self-pay | Admitting: Internal Medicine

## 2022-11-25 ENCOUNTER — Ambulatory Visit (INDEPENDENT_AMBULATORY_CARE_PROVIDER_SITE_OTHER): Payer: Managed Care, Other (non HMO) | Admitting: Internal Medicine

## 2022-11-25 ENCOUNTER — Encounter: Payer: Self-pay | Admitting: Internal Medicine

## 2022-11-25 VITALS — BP 154/96 | HR 75 | Temp 96.8°F | Wt 219.0 lb

## 2022-11-25 DIAGNOSIS — M5442 Lumbago with sciatica, left side: Secondary | ICD-10-CM

## 2022-11-25 DIAGNOSIS — E6609 Other obesity due to excess calories: Secondary | ICD-10-CM

## 2022-11-25 DIAGNOSIS — I10 Essential (primary) hypertension: Secondary | ICD-10-CM | POA: Diagnosis not present

## 2022-11-25 DIAGNOSIS — Z6834 Body mass index (BMI) 34.0-34.9, adult: Secondary | ICD-10-CM

## 2022-11-25 MED ORDER — PREDNISONE 10 MG PO TABS: ORAL_TABLET | ORAL | 0 refills | Status: DC

## 2022-11-25 MED ORDER — METHOCARBAMOL 500 MG PO TABS: 500.0000 mg | ORAL_TABLET | Freq: Three times a day (TID) | ORAL | 0 refills | Status: DC | PRN

## 2022-11-25 NOTE — Progress Notes (Signed)
Subjective:    Patient ID: Mikayla Briggs, female    DOB: 1981-04-02, 42 y.o.   MRN: [id number]  HPI  Patient presents to clinic today with complaint of back pain.  This started 2 weeks ago.  She describes the pain as sharp, stabbing and throbbing. The pain radiates into her left buttock. She denies numbness, tingling or weakness. She denies loss of bowel or bladder control. She denies any injury to the area. She has tried Ibuprofen and Cyclobenzaprine with minimal relief of symptoms.   Of note, her BP today is 146/94.  She has had multiple elevated blood pressures recently but we felt like this was related to stress secondary to her current relationship status.  She reports her divorce is final on 12/05/2021.  She is not currently taking antihypertensive medications at this time.  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 90 tablet 1   Butalbital-APAP-Caffeine 50-300-40 MG CAPS Take 1 capsule by mouth every 4 (four) hours as needed.     cetirizine (ZYRTEC) 10 MG tablet Take 1 tablet (10 mg total) by mouth daily. 90 tablet 3   cyclobenzaprine (FLEXERIL) 5 MG tablet Take 1 tablet (5 mg total) by mouth 3 (three) times daily as needed for muscle spasms. 30 tablet 0   levocetirizine (XYZAL) 5 MG tablet TAKE 1 TABLET BY MOUTH ONCE A DAY EVERY EVENING. 90 tablet 1   montelukast (SINGULAIR) 10 MG tablet Take 1 tablet (10 mg total) by mouth at bedtime. 90 tablet 1   naproxen (NAPROSYN) 500 MG tablet Take 1 tablet (500 mg total) by mouth daily as needed for headache. 30 tablet 2    ondansetron (ZOFRAN-ODT) 4 MG disintegrating tablet Take 1 tablet (4 mg total) by mouth every 8 (eight) hours as needed for nausea or vomiting. 30 tablet 0   Vitamins/Minerals TABS Take by mouth.     No current facility-administered medications for this visit.    Allergies  Allergen Reactions   Iodine Itching and Swelling   Ivp Dye [Iodinated Contrast Media] Anaphylaxis    Dripped IVP dye on her arm after taking an IV out of a patient and had angioedema within 15 mins.    Omeprazole Anaphylaxis and Shortness Of Breath   Penicillins Anaphylaxis and Shortness Of Breath    Has patient had a PCN reaction causing immediate rash, facial/tongue/throat swelling, SOB or lightheadedness with hypotension: Yes Has patient had a PCN reaction causing severe rash involving mucus membranes or skin necrosis: No Has patient had a PCN reaction that required hospitalization: Unknown Has patient had a PCN reaction occurring within the last 10 years: No If all of the above answers are "NO", then may proceed with Cephalosporin use.    Shellfish Allergy Anaphylaxis and Shortness Of Breath   Soap Itching    ANYTHING WITH FRAGRANCE    Family History  Problem Relation Age of Onset   Arthritis Mother    Ovarian cancer Mother    Hyperlipidemia Mother    Hypertension Mother    Irritable  bowel syndrome Mother    Arthritis Father    Hypertension Father    Asthma Father    Asthma Sister    Arthritis Paternal Grandmother     Social History   Socioeconomic History   Marital status: Legally Separated    Spouse name: Lonnie   Number of children: 0   Years of education: Not on file   Highest education level: Not on file  Occupational History   Occupation: CNA/ CPT  Tobacco Use   Smoking status: Never   Smokeless tobacco: Never  Vaping Use   Vaping Use: Never used  Substance and Sexual Activity   Alcohol use: Not Currently    Alcohol/week: 0.0 standard drinks of alcohol    Comment: occasionally    Drug use: No   Sexual activity: Yes    Birth control/protection: Pill  Other Topics Concern   Not on file  Social History Narrative   Not on file   Social Determinants of Health   Financial Resource Strain: Not on file  Food Insecurity: Not on file  Transportation Needs: Not on file  Physical Activity: Not on file  Stress: Not on file  Social Connections: Not on file  Intimate Partner Violence: Not on file     Constitutional: Denies fever, malaise, fatigue, headache or abrupt weight changes.  Respiratory: Denies difficulty breathing, shortness of breath, cough or sputum production.   Cardiovascular: Denies chest pain, chest tightness, palpitations or swelling in the hands or feet.  Gastrointestinal: Denies loss of bowel control.  GU: Denies loss of bladder control. Musculoskeletal: Patient reports low back pain, decreased range of motion.  Denies  difficulty with gait, or joint swelling.  Skin: Denies redness, rashes, lesions or ulcercations.  Neurological: Denies numbness, ting, weakness or problems with balance and coordination.    No other specific complaints in a complete review of systems (except as listed in HPI above).  Objective:   Physical Exam  BP (!) 154/96 (BP Location: Left Arm, Patient Position: Sitting, Cuff Size: Normal)   Pulse 75   Temp (!) 96.8 F (36 C) (Temporal)   Wt 219 lb (99.3 kg)   SpO2 100%   BMI 34.82 kg/m   Wt Readings from Last 3 Encounters:  09/12/22 223 lb 1.7 oz (101.2 kg)  07/14/22 223 lb (101.2 kg)  06/28/22 228 lb (103.4 kg)    General: Appears her stated age, obese, in NAD. Cardiovascular: Normal rate and rhythm. S1,S2 noted.  No murmur, rubs or gallops noted.  Pulmonary/Chest: Normal effort and positive vesicular breath sounds. No respiratory distress. No wheezes, rales or ronchi noted.  Musculoskeletal: Decreased flexion and rotation to the left of the spine.  Normal extension and rotation to the right.  Normal lateral  bending.  Bony tenderness noted over the lumbar spine.  Strength 5/5 BUE.  No difficulty with gait. Neurological: Alert and oriented.  Negative SLR.  Coordination normal.   BMET    Component Value Date/Time   NA 136 09/12/2022 2122   NA 137 01/02/2014 1152   K 3.5 09/12/2022 2122   K 3.9 01/02/2014 1152   CL 101 09/12/2022 2122   CL 102 01/02/2014 1152   CO2 25 09/12/2022 2122   CO2 26 01/02/2014 1152   GLUCOSE 113 (H) 09/12/2022 2122   GLUCOSE 105 (H) 01/02/2014 1152   BUN 8 09/12/2022 2122   BUN 9 01/02/2014 1152   CREATININE 0.74 09/12/2022 2122   CREATININE 0.67 02/04/2022 1418   CALCIUM 9.3 09/12/2022 2122  CALCIUM 8.8 01/02/2014 1152   GFRNONAA >60 09/12/2022 2122   GFRNONAA >60 01/02/2014 1152   GFRAA >60 02/23/2019 1741   GFRAA >60 01/02/2014 1152    Lipid Panel     Component Value Date/Time   CHOL 164 02/04/2022 1418   TRIG 98 02/04/2022 1418   HDL 47 (L) 02/04/2022 1418   CHOLHDL 3.5 02/04/2022 1418   VLDL 15.0 09/18/2020 1108   LDLCALC 98 02/04/2022 1418    CBC    Component Value Date/Time   WBC 18.9 (H) 09/12/2022 2122   RBC 4.54 09/12/2022 2122   HGB 13.8 09/12/2022 2122   HGB 14.5 01/02/2014 1152   HCT 41.0 09/12/2022 2122   HCT 43.6 01/02/2014 1152   PLT 313 09/12/2022 2122   PLT 280 01/02/2014 1152   MCV 90.3 09/12/2022 2122   MCV 90 01/02/2014 1152   MCH 30.4 09/12/2022 2122   MCHC 33.7 09/12/2022 2122   RDW 12.4 09/12/2022 2122   RDW 12.7 01/02/2014 1152   LYMPHSABS 0.8 09/12/2022 2122   LYMPHSABS 2.4 01/02/2014 1152   MONOABS 1.1 (H) 09/12/2022 2122   MONOABS 0.7 01/02/2014 1152   EOSABS 0.1 09/12/2022 2122   EOSABS 0.4 01/02/2014 1152   BASOSABS 0.0 09/12/2022 2122   BASOSABS 0.1 01/02/2014 1152    Hgb A1C Lab Results  Component Value Date   HGBA1C 5.2 02/04/2022            Assessment & Plan:   Acute Low Back Pain with Left-Sided Sciatica:  She does not want x-ray of the lumbar spine but would rather pursue MRI at  this time Encouraged regular stretching Ice may be helpful Rx for Pred taper x 9 days Rx for Methocarbamol 500 mg every 8 hours-sedation caution given   RTC in 2 months for annual exam Webb Silversmith, NP

## 2022-11-25 NOTE — Patient Instructions (Signed)
Sciatica  Sciatica is pain, weakness, tingling, or loss of feeling (numbness) along the sciatic nerve. The sciatic nerve starts in the lower back and goes down the back of each leg. Sciatica usually affects one side of the body. Sciatica usually goes away on its own or with treatment. Sometimes, sciatica may come back. What are the causes? This condition happens when the sciatic nerve is pinched or has pressure put on it. This may be caused by: A disk in between the bones of the spine bulging out too far (herniated disk). Changes in the spinal disks due to aging. A condition that affects a muscle in the butt. Extra bone growth near the sciatic nerve. A break (fracture) of the area between your hip bones (pelvis). Pregnancy. Tumor. This is rare. What increases the risk? You are more likely to develop this condition if you: Play sports that put pressure or stress on the spine. Have poor strength and ease of movement (flexibility). Have had a back injury or back surgery. Sit for long periods of time. Do activities that involve bending or lifting over and over again. Are very overweight (obese). What are the signs or symptoms? Symptoms can vary from mild to very bad. They may include: Any of these problems in the lower back, leg, hip, or butt: Mild tingling, loss of feeling, or dull aches. A burning feeling. Sharp pains. Loss of feeling in the back of the calf or the sole of the foot. Leg weakness. Very bad back pain that makes it hard to move. These symptoms may get worse when you cough, sneeze, or laugh. They may also get worse when you sit or stand for long periods of time. How is this treated? This condition often gets better without any treatment. However, treatment may include: Changing or cutting back on physical activity when you have pain. Exercising, including strengthening and stretching. Putting ice or heat on the affected area. Shots of medicines to relieve pain and  swelling or to relax your muscles. Surgery. Follow these instructions at home: Medicines Take over-the-counter and prescription medicines only as told by your doctor. Ask your doctor if you should avoid driving or using machines while you are taking your medicine. Managing pain     If told, put ice on the affected area. To do this: Put ice in a plastic bag. Place a towel between your skin and the bag. Leave the ice on for 20 minutes, 2-3 times a day. If your skin turns bright red, take off the ice right away to prevent skin damage. The risk of skin damage is higher if you cannot feel pain, heat, or cold. If told, put heat on the affected area. Do this as often as told by your doctor. Use the heat source that your doctor tells you to use, such as a moist heat pack or a heating pad. Place a towel between your skin and the heat source. Leave the heat on for 20-30 minutes. If your skin turns bright red, take off the heat right away to prevent burns. The risk of burns is higher if you cannot feel pain, heat, or cold. Activity  Return to your normal activities when your doctor says that it is safe. Avoid activities that make your symptoms worse. Take short rests during the day. When you rest for a long time, do some physical activity or stretching between periods of rest. Avoid sitting for a long time without moving. Get up and move around at least one time each   hour. Do exercises and stretches as told by your doctor. Do not lift anything that is heavier than 10 lb (4.5 kg). Avoid lifting heavy things even when you do not have symptoms. Avoid lifting heavy things over and over. When you lift objects, always lift in a way that is safe for your body. To do this, you should: Bend your knees. Keep the object close to your body. Avoid twisting. General instructions Stay at a healthy weight. Wear comfortable shoes that support your feet. Avoid wearing high heels. Avoid sleeping on a mattress  that is too soft or too hard. You might have less pain if you sleep on a mattress that is firm enough to support your back. Contact a doctor if: Your pain is not controlled by medicine. Your pain does not get better. Your pain gets worse. Your pain lasts longer than 4 weeks. You lose weight without trying. Get help right away if: You cannot control when you pee (urinate) or poop (have a bowel movement). You have weakness in any of these areas and it gets worse: Lower back. The area between your hip bones. Butt. Legs. You have redness or swelling of your back. You have a burning feeling when you pee. Summary Sciatica is pain, weakness, tingling, or loss of feeling (numbness) along the sciatic nerve. This may include the lower back, legs, hips, and butt. This condition happens when the sciatic nerve is pinched or has pressure put on it. Treatment often includes rest, exercise, medicines, and putting ice or heat on the affected area. This information is not intended to replace advice given to you by your health care provider. Make sure you discuss any questions you have with your health care provider. Document Revised: 12/07/2021 Document Reviewed: 12/07/2021 Elsevier Patient Education  2023 [employer]vier Inc.  

## 2022-11-25 NOTE — Assessment & Plan Note (Signed)
Encourage diet and exercise for weight loss 

## 2022-11-25 NOTE — Assessment & Plan Note (Signed)
If does not improve by her next visit, will need to discuss antihypertensive therapy

## 2022-11-30 ENCOUNTER — Telehealth: Payer: Self-pay

## 2022-11-30 DIAGNOSIS — M5442 Lumbago with sciatica, left side: Secondary | ICD-10-CM

## 2022-11-30 NOTE — Telephone Encounter (Signed)
I am not doing a peer to peer review.  I advised the patient that she would need an x-ray and 6 weeks of conservative therapy prior to getting MRI.  Patient reported "insurance does not matter, it will be done at my work and they will write off what ever my insurance does not pay".  You can call the patient let her know that the insurance would not approve her MRI.

## 2022-11-30 NOTE — Telephone Encounter (Signed)
Pt advised.  She would like to get an X-ray if you would put the order in.   Thank you,   -Mickel Baas

## 2022-11-30 NOTE — Telephone Encounter (Signed)
Copied from [place] [id number]. Topic: General - Other >> Nov 30, 2022  1:33 PM Everette C wrote: Reason for CRM: Mikayla Briggs. with [facility] has called to request contact regarding a peer-to-peer review for reconsideration of a previous denial of prior authorization for an MRI of the lumbar spine   Case ID [id number]  Please contact [phone] option 4 for discussion

## 2022-12-01 ENCOUNTER — Ambulatory Visit
Admission: RE | Admit: 2022-12-01 | Discharge: 2022-12-01 | Disposition: A | Discharge status: Home / Self Care | Payer: Managed Care, Other (non HMO) | Source: Ambulatory Visit | Attending: Internal Medicine | Admitting: Internal Medicine

## 2022-12-01 DIAGNOSIS — M5442 Lumbago with sciatica, left side: Secondary | ICD-10-CM

## 2022-12-01 NOTE — Addendum Note (Signed)
Addended by: Jearld Fenton on: 12/01/2022 08:41 AM   Modules accepted: Orders

## 2022-12-01 NOTE — Telephone Encounter (Signed)
Pt advised.   Thanks,   -Johnny Latu  

## 2022-12-01 NOTE — Telephone Encounter (Signed)
X-ray ordered.

## 2022-12-02 ENCOUNTER — Encounter: Payer: Self-pay | Admitting: Internal Medicine

## 2022-12-06 ENCOUNTER — Ambulatory Visit
Admission: RE | Admit: 2022-12-06 | Discharge: 2022-12-06 | Disposition: A | Discharge status: Home / Self Care | Payer: Managed Care, Other (non HMO) | Source: Ambulatory Visit | Attending: Internal Medicine | Admitting: Internal Medicine

## 2022-12-06 DIAGNOSIS — M5442 Lumbago with sciatica, left side: Secondary | ICD-10-CM

## 2022-12-22 ENCOUNTER — Other Ambulatory Visit: Payer: Self-pay | Admitting: Neurosurgery

## 2022-12-22 DIAGNOSIS — M5126 Other intervertebral disc displacement, lumbar region: Secondary | ICD-10-CM

## 2022-12-30 ENCOUNTER — Other Ambulatory Visit: Payer: Self-pay

## 2022-12-30 NOTE — Progress Notes (Signed)
Spoke to patient in person to review instructions for 13 hr prep for Epidural injection w/contrast on 01/12/23 at 9:00AM. Prescription called into [facility]. Pt aware and verbalized understanding of instructions, pt aware of drivers policy and will have a driver the day of her procedure.    Prescription: 50 mg of Prednisone on 01/11/23 at 8:00 PM 50 mg of Prednisone on 01/12/23 at 2:00 AM 50 mg of Prednisone on 01/12/23 at 8:00 AM.   Pt is also to take 50 mg of Benadryl on 01/12/23 at 8:00AM. Patient will take home supply of Benadryl.   Please call [phone] with any questions.

## 2023-01-11 NOTE — Discharge Instructions (Signed)

## 2023-01-12 ENCOUNTER — Ambulatory Visit
Admission: RE | Admit: 2023-01-12 | Discharge: 2023-01-12 | Disposition: A | Discharge status: Home / Self Care | Payer: Managed Care, Other (non HMO) | Source: Ambulatory Visit | Attending: Neurosurgery | Admitting: Neurosurgery

## 2023-01-12 DIAGNOSIS — M5126 Other intervertebral disc displacement, lumbar region: Secondary | ICD-10-CM

## 2023-01-12 MED ORDER — METHYLPREDNISOLONE ACETATE 40 MG/ML INJ SUSP (RADIOLOG
80.0000 mg | Freq: Once | INTRAMUSCULAR | Status: AC
  Administered 2023-01-12: 80 mg via EPIDURAL

## 2023-01-12 MED ORDER — IOPAMIDOL (ISOVUE-M 200) INJECTION 41%
1.0000 mL | Freq: Once | INTRAMUSCULAR | Status: AC
  Administered 2023-01-12: 1 mL via EPIDURAL

## 2023-02-08 ENCOUNTER — Ambulatory Visit (INDEPENDENT_AMBULATORY_CARE_PROVIDER_SITE_OTHER): Payer: Managed Care, Other (non HMO) | Admitting: Internal Medicine

## 2023-02-08 ENCOUNTER — Encounter: Payer: Self-pay | Admitting: Internal Medicine

## 2023-02-08 VITALS — BP 132/86 | HR 100 | Temp 96.8°F | Ht 66.5 in | Wt 224.0 lb

## 2023-02-08 DIAGNOSIS — Z6835 Body mass index (BMI) 35.0-35.9, adult: Secondary | ICD-10-CM

## 2023-02-08 DIAGNOSIS — R7309 Other abnormal glucose: Secondary | ICD-10-CM

## 2023-02-08 DIAGNOSIS — E6609 Other obesity due to excess calories: Secondary | ICD-10-CM | POA: Diagnosis not present

## 2023-02-08 DIAGNOSIS — Z0001 Encounter for general adult medical examination with abnormal findings: Secondary | ICD-10-CM | POA: Diagnosis not present

## 2023-02-08 LAB — CBC
HCT: 39.8 % (ref 35.0–45.0)
MCHC: 33.7 g/dL (ref 32.0–36.0)

## 2023-02-08 NOTE — Assessment & Plan Note (Signed)
Encouraged diet and exercise for weight loss ?

## 2023-02-08 NOTE — Patient Instructions (Signed)

## 2023-02-08 NOTE — Progress Notes (Signed)
Subjective:    Patient ID: Mikayla Briggs, female    DOB: Feb 02, 1981, 42 y.o.   MRN: [id number]  HPI  Patient presents to clinic today for her annual exam.  Flu: 06/2019 Tetanus: 12/2018 COVID: Never Pap smear: 03/2020 Mammogram: 06/2022 Vision screening: annually Dentist: biannually  Diet: She does eat meat. She consumes fruits and veggies. She tries to avoid fried foods. She drinks mostly water, soda. 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 90 tablet 1   Butalbital-APAP-Caffeine 50-300-40 MG CAPS Take 1 capsule by mouth every 4 (four) hours as needed.     cetirizine (ZYRTEC) 10 MG tablet Take 1 tablet (10 mg total) by mouth daily. 90 tablet 3   levocetirizine (XYZAL) 5 MG tablet TAKE 1 TABLET BY MOUTH ONCE A DAY EVERY EVENING. 90 tablet 1   methocarbamol (ROBAXIN) 500 MG tablet Take 1 tablet (500 mg total) by mouth every 8 (eight) hours as needed for muscle spasms. 15 tablet 0   montelukast (SINGULAIR) 10 MG tablet Take 1 tablet (10 mg total) by mouth at bedtime. 90 tablet 1   ondansetron (ZOFRAN-ODT) 4 MG disintegrating tablet Take 1 tablet (4 mg total) by mouth every 8 (eight) hours as needed for nausea or vomiting. 30 tablet 0   predniSONE (DELTASONE) 10 MG tablet Take 3 tabs on days 1-3, 2 tabs on days 4-6, 1 tab on days 7-9 18 tablet 0   Vitamins/Minerals TABS Take by mouth.     No current facility-administered medications for this visit.    Allergies  Allergen Reactions   Iodine Itching and Swelling   Ivp Dye [Iodinated Contrast  Media] Hives and Rash    Dripped IVP dye on her arm after taking an IV out of a patient and pt reported she had rash on arm within 15 mins. Updated 12/30/22 after s/w pt.    Omeprazole Anaphylaxis and Shortness Of Breath   Penicillins Anaphylaxis and Shortness Of Breath    Has patient had a PCN reaction causing immediate rash, facial/tongue/throat swelling, SOB or lightheadedness with hypotension: Yes Has patient had a PCN reaction causing severe rash involving mucus membranes or skin necrosis: No Has patient had a PCN reaction that required hospitalization: Unknown Has patient had a PCN reaction occurring within the last 10 years: No If all of the above answers are "NO", then may proceed with Cephalosporin use.    Shellfish Allergy Anaphylaxis and Shortness Of Breath   Soap Itching    ANYTHING WITH FRAGRANCE    Family History  Problem Relation Age of Onset   Arthritis Mother    Ovarian cancer Mother    Hyperlipidemia Mother    Hypertension Mother    Irritable bowel syndrome Mother    Arthritis Father    Hypertension Father    Asthma Father    Asthma Sister    Arthritis Paternal Grandmother     Social History   Socioeconomic History   Marital status: Divorced    Spouse name: Derryl Harbor   Number  of children: 0   Years of education: Not on file   Highest education level: Not on file  Occupational History   Occupation: CNA/ CPT  Tobacco Use   Smoking status: Never   Smokeless tobacco: Never  Vaping Use   Vaping Use: Never used  Substance and Sexual Activity   Alcohol use: Not Currently    Alcohol/week: 0.0 standard drinks of alcohol    Comment: occasionally   Drug use: No   Sexual activity: Yes    Birth control/protection: Pill  Other Topics Concern   Not on file  Social History Narrative   Not on file   Social Determinants of Health   Financial Resource Strain: Not on file  Food Insecurity: Not on file  Transportation Needs: Not on file  Physical Activity: Not on  file  Stress: Not on file  Social Connections: Not on file  Intimate Partner Violence: Not on file     Constitutional: Patient reports intermittent headaches.  Denies fever, malaise, fatigue, or abrupt weight changes.  HEENT: Denies eye pain, eye redness, ear pain, ringing in the ears, wax buildup, runny nose, nasal congestion, bloody nose, or sore throat. Respiratory: Denies difficulty breathing, shortness of breath, cough or sputum production.   Cardiovascular: Denies chest pain, chest tightness, palpitations or swelling in the hands or feet.  Gastrointestinal: Denies abdominal pain, bloating, constipation, diarrhea or blood in the stool.  GU: Denies urgency, frequency, pain with urination, burning sensation, blood in urine, odor or discharge. Musculoskeletal: Denies decrease in range of motion, difficulty with gait, muscle pain or joint pain and swelling.  Skin: Denies redness, rashes, lesions or ulcercations.  Neurological: Denies dizziness, difficulty with memory, difficulty with speech or problems with balance and coordination.  Psych: Patient has a history of anxiety.  Denies depression, SI/HI.  No other specific complaints in a complete review of systems (except as listed in HPI above).  Objective:   Physical Exam   BP 132/86 (BP Location: Left Arm, Patient Position: Sitting, Cuff Size: Large)   Pulse 100   Temp (!) 96.8 F (36 C) (Temporal)   Ht 5' 6.5" (1.689 m)   Wt 224 lb (101.6 kg)   SpO2 98%   BMI 35.61 kg/m   Wt Readings from Last 3 Encounters:  11/25/22 219 lb (99.3 kg)  09/12/22 223 lb 1.7 oz (101.2 kg)  07/14/22 223 lb (101.2 kg)    General: Appears her stated age, obese, in NAD. Skin: Warm, dry and intact.  HEENT: Head: normal shape and size; Eyes: sclera white, no icterus, conjunctiva pink, PERRLA and EOMs intact; Neck:  Neck supple, trachea midline. No masses, lumps or thyromegaly present.  Cardiovascular: Normal rate and rhythm. S1,S2 noted.  No  murmur, rubs or gallops noted. No JVD or BLE edema. Pulmonary/Chest: Normal effort and positive vesicular breath sounds. No respiratory distress. No wheezes, rales or ronchi noted.  Abdomen:  Normal bowel sounds.  Musculoskeletal: Strength 5/5 BUE/BLE. No difficulty with gait.  Neurological: Alert and oriented. Cranial nerves II-XII grossly intact. Coordination normal.  Psychiatric: Mood and affect normal. Behavior is normal. Judgment and thought content normal.    BMET    Component Value Date/Time   NA 136 09/12/2022 2122   NA 137 01/02/2014 1152   K 3.5 09/12/2022 2122   K 3.9 01/02/2014 1152   CL 101 09/12/2022 2122   CL 102 01/02/2014 1152   CO2 25 09/12/2022 2122   CO2 26 01/02/2014 1152   GLUCOSE 113 (H) 09/12/2022  2122   GLUCOSE 105 (H) 01/02/2014 1152   BUN 8 09/12/2022 2122   BUN 9 01/02/2014 1152   CREATININE 0.74 09/12/2022 2122   CREATININE 0.67 02/04/2022 1418   CALCIUM 9.3 09/12/2022 2122   CALCIUM 8.8 01/02/2014 1152   GFRNONAA >60 09/12/2022 2122   GFRNONAA >60 01/02/2014 1152   GFRAA >60 02/23/2019 1741   GFRAA >60 01/02/2014 1152    Lipid Panel     Component Value Date/Time   CHOL 164 02/04/2022 1418   TRIG 98 02/04/2022 1418   HDL 47 (L) 02/04/2022 1418   CHOLHDL 3.5 02/04/2022 1418   VLDL 15.0 09/18/2020 1108   LDLCALC 98 02/04/2022 1418    CBC    Component Value Date/Time   WBC 18.9 (H) 09/12/2022 2122   RBC 4.54 09/12/2022 2122   HGB 13.8 09/12/2022 2122   HGB 14.5 01/02/2014 1152   HCT 41.0 09/12/2022 2122   HCT 43.6 01/02/2014 1152   PLT 313 09/12/2022 2122   PLT 280 01/02/2014 1152   MCV 90.3 09/12/2022 2122   MCV 90 01/02/2014 1152   MCH 30.4 09/12/2022 2122   MCHC 33.7 09/12/2022 2122   RDW 12.4 09/12/2022 2122   RDW 12.7 01/02/2014 1152   LYMPHSABS 0.8 09/12/2022 2122   LYMPHSABS 2.4 01/02/2014 1152   MONOABS 1.1 (H) 09/12/2022 2122   MONOABS 0.7 01/02/2014 1152   EOSABS 0.1 09/12/2022 2122   EOSABS 0.4 01/02/2014 1152    BASOSABS 0.0 09/12/2022 2122   BASOSABS 0.1 01/02/2014 1152    Hgb A1C Lab Results  Component Value Date   HGBA1C 5.2 02/04/2022           Assessment & Plan:   Preventative Health Maintenance:  Encouraged her to get a flu shot in fall Tetanus UTD Encouraged her to get her COVID-vaccine Pap smear UTD Mammogram UTD Encouraged her to consume a balanced diet and exercise regimen Advised her seeing eye doctor and dentist annually We will check CBC, c-Met, lipid, A1c today  RTC in 6 months for follow-up of chronic conditions Nicki Reaper, NP

## 2023-02-09 ENCOUNTER — Encounter: Payer: Self-pay | Admitting: Internal Medicine

## 2023-02-09 LAB — CBC
Hemoglobin: 13.4 g/dL (ref 11.7–15.5)
MCH: 30 pg (ref 27.0–33.0)
MCV: 89.2 fL (ref 80.0–100.0)
MPV: 10.4 fL (ref 7.5–12.5)
Platelets: 271 10*3/uL (ref 140–400)
RBC: 4.46 10*6/uL (ref 3.80–5.10)
RDW: 12.8 % (ref 11.0–15.0)
WBC: 10.8 10*3/uL (ref 3.8–10.8)

## 2023-02-09 LAB — COMPLETE METABOLIC PANEL WITH GFR
AG Ratio: 1.5 (calc) (ref 1.0–2.5)
ALT: 12 U/L (ref 6–29)
AST: 15 U/L (ref 10–30)
Albumin: 4 g/dL (ref 3.6–5.1)
Alkaline phosphatase (APISO): 47 U/L (ref 31–125)
BUN: 9 mg/dL (ref 7–25)
CO2: 24 mmol/L (ref 20–32)
Calcium: 9.2 mg/dL (ref 8.6–10.2)
Chloride: 107 mmol/L (ref 98–110)
Creat: 0.73 mg/dL (ref 0.50–0.99)
Globulin: 2.6 g/dL (calc) (ref 1.9–3.7)
Glucose, Bld: 97 mg/dL (ref 65–99)
Potassium: 4 mmol/L (ref 3.5–5.3)
Sodium: 140 mmol/L (ref 135–146)
Total Bilirubin: 0.5 mg/dL (ref 0.2–1.2)
Total Protein: 6.6 g/dL (ref 6.1–8.1)
eGFR: 106 mL/min/{1.73_m2} (ref >=60)

## 2023-02-09 LAB — LIPID PANEL
Cholesterol: 158 mg/dL (ref <200)
HDL: 53 mg/dL (ref >=50)
LDL Cholesterol (Calc): 83 mg/dL (calc)
Non-HDL Cholesterol (Calc): 105 mg/dL (calc) (ref <130)
Total CHOL/HDL Ratio: 3 (calc) (ref <5.0)
Triglycerides: 119 mg/dL (ref <150)

## 2023-02-09 LAB — HEMOGLOBIN A1C
Hgb A1c MFr Bld: 5.6 % of total Hgb (ref <5.7)
Mean Plasma Glucose: 114 mg/dL
eAG (mmol/L): 6.3 mmol/L

## 2023-02-10 NOTE — Telephone Encounter (Signed)
She did not mention any of the symptoms to me at her visit.  I have found that those over-the-counter UTI test kits are not always accurate.  She would need to be seen or she can do an e-visit through [employer].

## 2023-02-13 ENCOUNTER — Encounter (HOSPITAL_BASED_OUTPATIENT_CLINIC_OR_DEPARTMENT_OTHER): Payer: Self-pay

## 2023-02-13 ENCOUNTER — Emergency Department (HOSPITAL_BASED_OUTPATIENT_CLINIC_OR_DEPARTMENT_OTHER)
Admission: EM | Admit: 2023-02-13 | Discharge: 2023-02-13 | Disposition: A | Discharge status: Home / Self Care | Payer: Managed Care, Other (non HMO) | Attending: Emergency Medicine | Admitting: Emergency Medicine

## 2023-02-13 ENCOUNTER — Other Ambulatory Visit: Payer: Self-pay

## 2023-02-13 DIAGNOSIS — Z20822 Contact with and (suspected) exposure to covid-19: Secondary | ICD-10-CM | POA: Insufficient documentation

## 2023-02-13 DIAGNOSIS — T7840XA Allergy, unspecified, initial encounter: Secondary | ICD-10-CM | POA: Insufficient documentation

## 2023-02-13 DIAGNOSIS — R000 Tachycardia, unspecified: Secondary | ICD-10-CM | POA: Insufficient documentation

## 2023-02-13 DIAGNOSIS — J321 Chronic frontal sinusitis: Secondary | ICD-10-CM

## 2023-02-13 LAB — CBC WITH DIFFERENTIAL/PLATELET
Abs Immature Granulocytes: 0.09 10*3/uL — ABNORMAL HIGH (ref 0.00–0.07)
Basophils Absolute: 0 10*3/uL (ref 0.0–0.1)
Basophils Relative: 0 %
Eosinophils Absolute: 0 10*3/uL (ref 0.0–0.5)
Eosinophils Relative: 0 %
HCT: 35 % — ABNORMAL LOW (ref 36.0–46.0)
Hemoglobin: 12 g/dL (ref 12.0–15.0)
Immature Granulocytes: 1 %
Lymphocytes Relative: 10 %
Lymphs Abs: 1.8 10*3/uL (ref 0.7–4.0)
MCH: 30.1 pg (ref 26.0–34.0)
MCHC: 34.3 g/dL (ref 30.0–36.0)
MCV: 87.7 fL (ref 80.0–100.0)
Monocytes Absolute: 1.2 10*3/uL — ABNORMAL HIGH (ref 0.1–1.0)
Monocytes Relative: 7 %
Neutro Abs: 14.3 10*3/uL — ABNORMAL HIGH (ref 1.7–7.7)
Neutrophils Relative %: 82 %
Platelets: 216 10*3/uL (ref 150–400)
RBC: 3.99 MIL/uL (ref 3.87–5.11)
RDW: 12.7 % (ref 11.5–15.5)
WBC: 17.5 10*3/uL — ABNORMAL HIGH (ref 4.0–10.5)
nRBC: 0 % (ref 0.0–0.2)

## 2023-02-13 LAB — URINALYSIS, ROUTINE W REFLEX MICROSCOPIC
Bacteria, UA: NONE SEEN
Bilirubin Urine: NEGATIVE
Glucose, UA: NEGATIVE mg/dL
Hgb urine dipstick: NEGATIVE
Ketones, ur: NEGATIVE mg/dL
Nitrite: NEGATIVE
Protein, ur: NEGATIVE mg/dL
Specific Gravity, Urine: 1.016 (ref 1.005–1.030)
pH: 7 (ref 5.0–8.0)

## 2023-02-13 LAB — RESP PANEL BY RT-PCR (RSV, FLU A&B, COVID)  RVPGX2
Influenza A by PCR: NEGATIVE
Influenza B by PCR: NEGATIVE
Resp Syncytial Virus by PCR: NEGATIVE
SARS Coronavirus 2 by RT PCR: NEGATIVE

## 2023-02-13 LAB — BASIC METABOLIC PANEL
Anion gap: 9 (ref 5–15)
BUN: 5 mg/dL — ABNORMAL LOW (ref 6–20)
CO2: 23 mmol/L (ref 22–32)
Calcium: 8.6 mg/dL — ABNORMAL LOW (ref 8.9–10.3)
Chloride: 100 mmol/L (ref 98–111)
Creatinine, Ser: 0.61 mg/dL (ref 0.44–1.00)
GFR, Estimated: >60 mL/min (ref >60)
Glucose, Bld: 113 mg/dL — ABNORMAL HIGH (ref 70–99)
Potassium: 3.2 mmol/L — ABNORMAL LOW (ref 3.5–5.1)
Sodium: 132 mmol/L — ABNORMAL LOW (ref 135–145)

## 2023-02-13 MED ORDER — CEFDINIR 300 MG PO CAPS: 300.0000 mg | ORAL_CAPSULE | Freq: Two times a day (BID) | ORAL | 0 refills | Status: AC

## 2023-02-13 MED ORDER — ACETAMINOPHEN 500 MG PO TABS
1000.0000 mg | ORAL_TABLET | Freq: Once | ORAL | Status: AC
  Administered 2023-02-13: 1000 mg via ORAL
  Filled 2023-02-13: qty 2

## 2023-02-13 MED ORDER — ONDANSETRON HCL 4 MG/2ML IJ SOLN
4.0000 mg | Freq: Once | INTRAMUSCULAR | Status: AC
  Administered 2023-02-13: 4 mg via INTRAVENOUS
  Filled 2023-02-13: qty 2

## 2023-02-13 MED ORDER — KETOROLAC TROMETHAMINE 15 MG/ML IJ SOLN
15.0000 mg | Freq: Once | INTRAMUSCULAR | Status: AC
  Administered 2023-02-13: 15 mg via INTRAVENOUS
  Filled 2023-02-13: qty 1

## 2023-02-13 MED ORDER — DIPHENHYDRAMINE HCL 50 MG/ML IJ SOLN
25.0000 mg | Freq: Once | INTRAMUSCULAR | Status: AC
  Administered 2023-02-13: 25 mg via INTRAVENOUS
  Filled 2023-02-13: qty 1

## 2023-02-13 MED ORDER — SODIUM CHLORIDE 0.9 % IV BOLUS
1000.0000 mL | Freq: Once | INTRAVENOUS | Status: AC
  Administered 2023-02-13: 1000 mL via INTRAVENOUS

## 2023-02-13 MED ORDER — LACTATED RINGERS IV BOLUS
1000.0000 mL | Freq: Once | INTRAVENOUS | Status: AC
  Administered 2023-02-13: 1000 mL via INTRAVENOUS

## 2023-02-13 NOTE — ED Triage Notes (Signed)
Patient here POV from Home.  Endorses Throat Pain/Swelling, Head Pain, Otalgia and Neck pain ever since eating Almonds yesterday at 1600.   2 mg of Benadryl yesterday. None today.   NAD Noted during triage. A&Ox4. GCS 15. Ambulatory.

## 2023-02-13 NOTE — ED Notes (Signed)
Dc instructions reviewed with patient. Patient voiced understanding. Dc with belongings.  °

## 2023-02-13 NOTE — ED Provider Notes (Signed)
Received patient in turnover from Dr. Anitra Lauth.  Please see their note for further details of Hx, PE.  Briefly patient is a 42 y.o. female with a Allergic Reaction .  Patient started having symptoms yesterday after she had some almonds thinking that perhaps she had an almond allergy.  Per the husband she had developed a fever overnight since was complaining of bilateral ear pain had some difficulty.  Patient also on my record review had been complaining of urinary symptoms.  Looks like her PCP had declined to prescribe her antibiotics without an in person visits and she tells me that she had a virtual visit through a different organization that did start her on Macrobid.  I wonder if this is pyelonephritis though she does not have flank pain.  Clinically she has a URI as well and likely has sinusitis with severe facial discomfort.  Patient is feeling quite a bit better after IV fluids Toradol and Tylenol.  Will start on Omnicef which hopefully will cover well both the sinuses and her urine.  She does have a penicillin allergy.  Has tolerated Rocephin in the past.      Melene Plan, DO 02/13/23 1812

## 2023-02-13 NOTE — ED Provider Notes (Signed)
[facility] AT [facility] Provider Note   CSN: [id number] Arrival date & time: 02/13/23  1330     History  Chief Complaint  Patient presents with   Allergic Reaction    Mikayla Briggs is a 42 y.o. female.  Patient is a 23-year-old female with a history of multiple allergies but no other significant medical history other than migraines who is presenting today with complaints of concern for an allergic reaction.  Patient reports that she was at work yesterday when she ate almonds which she has not eaten for a long time.  After eating them she felt like her tongue was swelling she was having some difficulty swallowing and she had an episode of emesis.  She did take Benadryl yesterday but reports this morning she has not felt any better.  She still feels like her tongue is swollen and it is hard to breathe.  She has not taken any medication this morning.  She has not had any rash or hives to her skin.  She does take multiple allergy medicines including Zyrtec and Singulair.  The history is provided by the patient.  Allergic Reaction      Home Medications Prior to Admission medications   Medication Sig Start Date End Date Taking? Authorizing Provider  amitriptyline (ELAVIL) 25 MG tablet Take 1 tablet (25 mg total) by mouth at bedtime. 08/09/22   Lorre Munroe, NP  Butalbital-APAP-Caffeine 50-300-40 MG CAPS Take 1 capsule by mouth every 4 (four) hours as needed.    [provider]  cetirizine (ZYRTEC) 10 MG tablet Take 1 tablet (10 mg total) by mouth daily. 07/21/18   Lorre Munroe, NP  Drospirenone (SLYND) 4 MG TABS  01/24/23   [provider]  levocetirizine (XYZAL) 5 MG tablet TAKE 1 TABLET BY MOUTH ONCE A DAY EVERY EVENING. 08/09/22   Lorre Munroe, NP  methocarbamol (ROBAXIN) 500 MG tablet Take 1 tablet (500 mg total) by mouth every 8 (eight) hours as needed for muscle spasms. 11/25/22   Lorre Munroe, NP  montelukast  (SINGULAIR) 10 MG tablet Take 1 tablet (10 mg total) by mouth at bedtime. 08/09/22   Lorre Munroe, NP  Vitamins/Minerals TABS Take by mouth.    [provider]      Allergies    Iodine, Ivp dye [iodinated contrast media], Omeprazole, Penicillins, Shellfish allergy, and Soap    Review of Systems   Review of Systems  Physical Exam Updated Vital Signs BP (!) 147/101   Pulse (!) 115   Temp 99 F (37.2 C)   Resp (!) 23   Ht 5' 6.5" (1.689 m)   Wt 98.9 kg   SpO2 98%   BMI 34.66 kg/m  Physical Exam Vitals and nursing note reviewed.  Constitutional:      General: She is not in acute distress.    Appearance: She is well-developed.  HENT:     Head: Normocephalic and atraumatic.     Mouth/Throat:     Comments: No lip or tongue swelling appreciated.  No uvular swelling. Eyes:     Pupils: Pupils are equal, round, and reactive to light.  Neck:     Comments: No voice changes Cardiovascular:     Rate and Rhythm: Regular rhythm. Tachycardia present.     Heart sounds: Normal heart sounds. No murmur heard.    No friction rub.  Pulmonary:     Effort: Pulmonary effort is normal.     Breath sounds:  Normal breath sounds. No wheezing or rales.  Musculoskeletal:        General: No tenderness. Normal range of motion.     Comments: No edema  Skin:    General: Skin is warm and dry.     Findings: No rash.  Neurological:     Mental Status: She is alert and oriented to person, place, and time.     Cranial Nerves: No cranial nerve deficit.  Psychiatric:        Behavior: Behavior normal.     ED Results / Procedures / Treatments   Labs (all labs ordered are listed, but only abnormal results are displayed) Labs Reviewed - No data to display  EKG EKG Interpretation  Date/Time:  Sunday February 13 2023 13:49:38 EDT Ventricular Rate:  140 PR Interval:  138 QRS Duration: 90 QT Interval:  272 QTC Calculation: 415 R Axis:   -13 Text Interpretation: Sinus tachycardia Possible  Anterior infarct (cited on or before 13-Feb-2023) Abnormal ECG No significant change since last tracing Confirmed by Melene Plan [phone]) on 02/13/2023 3:01:33 PM  Radiology No results found.  Procedures Procedures    Medications Ordered in ED Medications  diphenhydrAMINE (BENADRYL) injection 25 mg (25 mg Intravenous Given 02/13/23 1443)  lactated ringers bolus 1,000 mL (1,000 mLs Intravenous New Bag/Given 02/13/23 1438)  ondansetron (ZOFRAN) injection 4 mg (4 mg Intravenous Given 02/13/23 1450)    ED Course/ Medical Decision Making/ A&P                             Medical Decision Making Risk Prescription drug management.   Patient presenting today with concern for possible allergic reaction.  No airway involvement at this time.  Patient is satting 98% on room air without stridor, no appreciable swelling in the lips tongue or throat.  Patient is already on prednisone for another issue.  At this time we will give IV Benadryl but since she has an allergic reaction to omeprazole she did not really want Pepcid even though it is in a different family.  Patient also given IV fluids that she has not eaten much today.  No indication for EpiPen at this time.        Final Clinical Impression(s) / ED Diagnoses Final diagnoses:  None    Rx / DC Orders ED Discharge Orders     None         Gwyneth Sprout, MD 02/13/23 1528

## 2023-02-13 NOTE — Discharge Instructions (Signed)
Take tylenol 2 pills 4 times a day and motrin 4 pills 3 times a day.  Drink plenty of fluids.  Return for worsening shortness of breath, headache, confusion. Follow up with your family doctor.   

## 2023-02-14 ENCOUNTER — Encounter: Payer: Self-pay | Admitting: Intensive Care

## 2023-02-14 ENCOUNTER — Other Ambulatory Visit: Payer: Self-pay

## 2023-02-14 ENCOUNTER — Observation Stay
Admission: EM | Admit: 2023-02-14 | Discharge: 2023-02-15 | Disposition: A | Discharge status: Home / Self Care | Payer: Managed Care, Other (non HMO) | Attending: Internal Medicine | Admitting: Internal Medicine

## 2023-02-14 ENCOUNTER — Emergency Department: Payer: Managed Care, Other (non HMO)

## 2023-02-14 DIAGNOSIS — N3000 Acute cystitis without hematuria: Secondary | ICD-10-CM | POA: Diagnosis not present

## 2023-02-14 DIAGNOSIS — D72829 Elevated white blood cell count, unspecified: Secondary | ICD-10-CM

## 2023-02-14 DIAGNOSIS — H9203 Otalgia, bilateral: Secondary | ICD-10-CM | POA: Insufficient documentation

## 2023-02-14 DIAGNOSIS — J029 Acute pharyngitis, unspecified: Secondary | ICD-10-CM | POA: Insufficient documentation

## 2023-02-14 DIAGNOSIS — N390 Urinary tract infection, site not specified: Principal | ICD-10-CM | POA: Insufficient documentation

## 2023-02-14 DIAGNOSIS — A419 Sepsis, unspecified organism: Secondary | ICD-10-CM | POA: Diagnosis not present

## 2023-02-14 LAB — URINALYSIS, ROUTINE W REFLEX MICROSCOPIC
Bilirubin Urine: NEGATIVE
Glucose, UA: NEGATIVE mg/dL
Hgb urine dipstick: NEGATIVE
Ketones, ur: 20 mg/dL — AB
Nitrite: NEGATIVE
Protein, ur: NEGATIVE mg/dL
Specific Gravity, Urine: 1.015 (ref 1.005–1.030)
pH: 7 (ref 5.0–8.0)

## 2023-02-14 LAB — CBC WITH DIFFERENTIAL/PLATELET
Abs Immature Granulocytes: 0.1 10*3/uL — ABNORMAL HIGH (ref 0.00–0.07)
Basophils Absolute: 0.1 10*3/uL (ref 0.0–0.1)
Basophils Relative: 0 %
Eosinophils Absolute: 0 10*3/uL (ref 0.0–0.5)
Eosinophils Relative: 0 %
HCT: 41.3 % (ref 36.0–46.0)
Hemoglobin: 13.8 g/dL (ref 12.0–15.0)
Immature Granulocytes: 1 %
Lymphocytes Relative: 12 %
Lymphs Abs: 2.2 10*3/uL (ref 0.7–4.0)
MCH: 29.5 pg (ref 26.0–34.0)
MCHC: 33.4 g/dL (ref 30.0–36.0)
MCV: 88.2 fL (ref 80.0–100.0)
Monocytes Absolute: 1.1 10*3/uL — ABNORMAL HIGH (ref 0.1–1.0)
Monocytes Relative: 6 %
Neutro Abs: 14.8 10*3/uL — ABNORMAL HIGH (ref 1.7–7.7)
Neutrophils Relative %: 81 %
Platelets: 284 10*3/uL (ref 150–400)
RBC: 4.68 MIL/uL (ref 3.87–5.11)
RDW: 12.7 % (ref 11.5–15.5)
WBC: 18.4 10*3/uL — ABNORMAL HIGH (ref 4.0–10.5)
nRBC: 0 % (ref 0.0–0.2)

## 2023-02-14 LAB — COMPREHENSIVE METABOLIC PANEL
ALT: 16 U/L (ref 0–44)
AST: 28 U/L (ref 15–41)
Albumin: 3.7 g/dL (ref 3.5–5.0)
Alkaline Phosphatase: 58 U/L (ref 38–126)
Anion gap: 13 (ref 5–15)
BUN: 6 mg/dL (ref 6–20)
CO2: 21 mmol/L — ABNORMAL LOW (ref 22–32)
Calcium: 9.1 mg/dL (ref 8.9–10.3)
Chloride: 100 mmol/L (ref 98–111)
Creatinine, Ser: 0.76 mg/dL (ref 0.44–1.00)
GFR, Estimated: >60 mL/min (ref >60)
Glucose, Bld: 161 mg/dL — ABNORMAL HIGH (ref 70–99)
Potassium: 3.6 mmol/L (ref 3.5–5.1)
Sodium: 134 mmol/L — ABNORMAL LOW (ref 135–145)
Total Bilirubin: 0.8 mg/dL (ref 0.3–1.2)
Total Protein: 7.4 g/dL (ref 6.5–8.1)

## 2023-02-14 LAB — LACTIC ACID, PLASMA
Lactic Acid, Venous: 3.1 mmol/L (ref 0.5–1.9)
Lactic Acid, Venous: 1.2 mmol/L (ref 0.5–1.9)

## 2023-02-14 LAB — POC URINE PREG, ED: Preg Test, Ur: NEGATIVE

## 2023-02-14 MED ORDER — SODIUM CHLORIDE 0.9 % IV BOLUS
1000.0000 mL | Freq: Once | INTRAVENOUS | Status: AC
  Administered 2023-02-14: 1000 mL via INTRAVENOUS

## 2023-02-14 MED ORDER — CIPROFLOXACIN IN D5W 400 MG/200ML IV SOLN
400.0000 mg | Freq: Once | INTRAVENOUS | Status: AC
  Administered 2023-02-14: 400 mg via INTRAVENOUS
  Filled 2023-02-14: qty 200

## 2023-02-14 NOTE — ED Notes (Signed)
Lab reports lactic results; acuity level changed and will take pt to next available exam room

## 2023-02-14 NOTE — ED Notes (Signed)
Repeat lactic acid to be drawn after fluids are complete

## 2023-02-14 NOTE — Assessment & Plan Note (Signed)
Likely due to above infections, continue tending to downtrend

## 2023-02-14 NOTE — Assessment & Plan Note (Addendum)
Has been going on and off for the last 36 hours as per patient.  Patient was evaluated at our [facility] facility yesterday.  Has continued to felt poorly since then.  Has taken cefdinir for this at home.  Basically has failure of outpatinet abx therapy in this regard. Exam is wnl. I will c.w. iv ceftraixone. COVID 19, Flu and RSV were negative yesterday.  A.w. sepsis today with elevated lactic. S.p 1 liter saline. Trending lactate.

## 2023-02-14 NOTE — H&P (Signed)
History and Physical    Patient: Mikayla Briggs ZOX:[id number] DOB: Nov 16, 1980 DOA: 02/14/2023 DOS: the patient was seen and examined on 02/14/2023 PCP: Lorre Munroe, NP  Patient coming from: Home  Chief Complaint:  Chief Complaint  Patient presents with   Urinary Tract Infection   HPI: Mikayla Briggs is a 42 y.o. female with medical history significant of having developed urinary frequency on June 29.  The following day patient was started on Macrobid and reports good symptomatic relief.  At the time patient had no flank pain no dysuria no fever.  Patient seems to have done well till yesterday when she reports a new onset of sore throat, subjective fevers at home and feeling extremely weak.  Patient was actually evaluated at our [facility] ER and treated with IV fluids, Toradol and prescribed cefdinir which patient did start taking.  Unfortunately patient returns to the ER at [facility] with complaints of feeling extremely fatigued and weak she continues to have sore throat  She has no trismus is eating and drinking okay there has been no vomiting in the last 48 hours no diarrhea no abdominal pain no chest pain or shortness of breath no cough no ear pain is reported.  ER course is notable for the patient being found to be septic and have elevated lactic acid s/p a liter of saline and patient has been ordered for IV ciprofloxacin.  Patient actually reports feeling much better now and actually wanted to go home.  Is disappointed to learn about recommendations to be hospitalized. Review of Systems: As mentioned in the history of present illness. All other systems reviewed and are negative. 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   Seasonal allergies    Wears contact  lenses    Past Surgical History:  Procedure Laterality Date   CHOLECYSTECTOMY  11/2003   CHROMOPERTUBATION N/A 05/08/2018   Procedure: CHROMOPERTUBATION;  Surgeon: Marcelle Overlie, MD;  Location: [facility];  Service: Gynecology;  Laterality: N/A;   IMAGE GUIDED SINUS SURGERY N/A 09/07/2016   Procedure: IMAGE GUIDED SINUS SURGERY;  Surgeon: Linus Salmons, MD;  Location: [facility];  Service: ENT;  Laterality: N/A;  STYKER GAVE DISK TO CECE 12-20 kp   IR EMBO VENOUS NOT HEMORR HEMANG  INC GUIDE ROADMAPPING  10/16/2020   IR RADIOLOGIST EVAL & MGMT  06/26/2020   KNEE ARTHROSCOPY Left 2000   KNEE ARTHROSCOPY WITH MEDIAL MENISECTOMY Right 10/10/2014   Procedure: RIGHT KNEE ARTHROSCOPY; RESECTION PLICA; CHONDROPLASTY, AND PARTIAL MEDIAL MENISCECTOMY;  Surgeon: Loreta Ave, MD;  Location: [facility];  Service: Orthopedics;  Laterality: Right;   LAPAROSCOPIC TUBAL LIGATION Bilateral 04/26/2019   Procedure: LAPAROSCOPIC TUBAL LIGATION;  Surgeon: Marcelle Overlie, MD;  Location: [facility];  Service: Gynecology;  Laterality: Bilateral;   LAPAROSCOPY N/A 05/08/2018   Procedure: LAPAROSCOPY DIAGNOSTIC;  Surgeon: Marcelle Overlie, MD;  Location: [facility];  Service: Gynecology;  Laterality: N/A;  fulgeration on endometriosis   MAXILLARY ANTROSTOMY Bilateral 09/07/2016   Procedure: MAXILLARY ANTROSTOMY WITH TISSUE REMOVAL;  Surgeon: Linus Salmons, MD;  Location: [facility];  Service: ENT;  Laterality: Bilateral;   NASAL TURBINATE REDUCTION Bilateral 09/07/2016   Procedure: TURBINATE REDUCTION/SUBMUCOSAL RESECTION;  Surgeon: Linus Salmons, MD;  Location: [facility];  Service: ENT;  Laterality: Bilateral;   SEPTOPLASTY N/A 09/07/2016   Procedure: SEPTOPLASTY;  Surgeon: Linus Salmons, MD;  Location: [facility];  Service: ENT;  Laterality: N/A;   TONSILLECTOMY AND ADENOIDECTOMY     TUBAL LIGATION      WISDOM TOOTH EXTRACTION     Social History:  reports that she has never smoked. She has never used smokeless tobacco. She reports that she does not currently use alcohol. She reports that she does not use drugs.  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 Updated 12/30/22 after s/w pt.    Omeprazole Anaphylaxis and Shortness Of Breath   Penicillins Anaphylaxis and Shortness Of Breath    Has patient had a PCN reaction causing immediate rash, facial/tongue/throat swelling, SOB or lightheadedness with hypotension: Yes Has patient had a PCN reaction causing severe rash involving mucus membranes or skin necrosis: No Has patient had a PCN reaction that required hospitalization: Unknown Has patient had a PCN reaction occurring within the last 10 years: No If all of the above answers are "NO", then may proceed with Cephalosporin use.    Shellfish Allergy Anaphylaxis and Shortness Of Breath   Almond (Diagnostic)    Soap Itching    ANYTHING WITH FRAGRANCE    Family History  Problem Relation Age of Onset   Arthritis Mother    Ovarian cancer Mother    Hyperlipidemia Mother    Hypertension Mother    Irritable bowel syndrome Mother    Arthritis Father    Hypertension Father    Asthma Father    Asthma Sister    Arthritis Paternal Grandmother     Prior to Admission medications   Medication Sig Start Date End Date Taking? Authorizing Provider  amitriptyline (ELAVIL) 25 MG tablet Take 1 tablet (25 mg total) by mouth at bedtime. 08/09/22  Yes Baity, Salvadore Oxford, NP  cefdinir (OMNICEF) 300 MG capsule Take 1 capsule (300 mg total) by mouth 2 (two) times daily for 7 days. 02/13/23 02/20/23 Yes Melene Plan, DO  Drospirenone (SLYND) 4 MG TABS Take 4 mg by mouth at bedtime. 01/24/23  Yes [provider]   levocetirizine (XYZAL) 5 MG tablet TAKE 1 TABLET BY MOUTH ONCE A DAY EVERY EVENING. 08/09/22  Yes Baity, Salvadore Oxford, NP  montelukast (SINGULAIR) 10 MG tablet Take 1 tablet (10 mg total) by mouth at bedtime. 08/09/22  Yes Baity, Salvadore Oxford, NP  Butalbital-APAP-Caffeine 50-300-40 MG CAPS Take 1 capsule by mouth every 4 (four) hours as needed.    [provider]  cetirizine (ZYRTEC) 10 MG tablet Take 1 tablet (10 mg total) by mouth daily. 07/21/18   Lorre Munroe, NP  methocarbamol (ROBAXIN) 500 MG tablet Take 1 tablet (500 mg total) by mouth every 8 (eight) hours as needed for muscle spasms. 11/25/22   Lorre Munroe, NP  Vitamins/Minerals TABS Take by mouth.    [provider]    Physical Exam: Vitals:   02/14/23 1841 02/14/23 1845  BP:  (!) 136/110  Pulse:  (!) 125  Resp:  (!) 22  Temp: 98.7 F (37.1 C)   TempSrc: Oral   SpO2:  95%  Weight: 98.9 kg   Height: 5' 6.5" (1.689 m)    General: Obese lady  no distress, coherent Respiratory exam: Bilateral intravesicular Cardiovascular exam S1-S2 normal Abdomen soft nontender Extremities warm without edema Oropharyngeal exam: Pharynx is healthy, there are some remote filled cavities, however dentition overall looks good, no gumline tenderness. Data Reviewed:  Labs on Admission:  Results for orders placed or performed during the hospital encounter of 02/14/23 (from the past 24 hour(s))  Lactic acid, plasma     Status: Abnormal   Collection Time: 02/14/23  6:45 PM  Result Value Ref Range   Lactic Acid, Venous 3.1 (HH) 0.5 - 1.9 mmol/L  Comprehensive metabolic panel     Status: Abnormal   Collection Time: 02/14/23  6:45 PM  Result Value Ref Range   Sodium 134 (L) 135 - 145 mmol/L   Potassium 3.6 3.5 - 5.1 mmol/L   Chloride 100 98 - 111 mmol/L   CO2 21 (L) 22 - 32 mmol/L   Glucose, Bld 161 (H) 70 - 99 mg/dL   BUN 6 6 - 20 mg/dL   Creatinine, Ser 1.61 0.44 - 1.00 mg/dL   Calcium 9.1 8.9 - 09.6 mg/dL   Total Protein  7.4 6.5 - 8.1 g/dL   Albumin 3.7 3.5 - 5.0 g/dL   AST 28 15 - 41 U/L   ALT 16 0 - 44 U/L   Alkaline Phosphatase 58 38 - 126 U/L   Total Bilirubin 0.8 0.3 - 1.2 mg/dL   GFR, Estimated >04 >54 mL/min   Anion gap 13 5 - 15  CBC with Differential     Status: Abnormal   Collection Time: 02/14/23  6:45 PM  Result Value Ref Range   WBC 18.4 (H) 4.0 - 10.5 K/uL   RBC 4.68 3.87 - 5.11 MIL/uL   Hemoglobin 13.8 12.0 - 15.0 g/dL   HCT 09.8 11.9 - 14.7 %   MCV 88.2 80.0 - 100.0 fL   MCH 29.5 26.0 - 34.0 pg   MCHC 33.4 30.0 - 36.0 g/dL   RDW 82.9 56.2 - 13.0 %   Platelets 284 150 - 400 K/uL   nRBC 0.0 0.0 - 0.2 %   Neutrophils Relative % 81 %   Neutro Abs 14.8 (H) 1.7 - 7.7 K/uL   Lymphocytes Relative 12 %   Lymphs Abs 2.2 0.7 - 4.0 K/uL   Monocytes Relative 6 %   Monocytes Absolute 1.1 (H) 0.1 - 1.0 K/uL   Eosinophils Relative 0 %   Eosinophils Absolute 0.0 0.0 - 0.5 K/uL   Basophils Relative 0 %   Basophils Absolute 0.1 0.0 - 0.1 K/uL   Immature Granulocytes 1 %   Abs Immature Granulocytes 0.10 (H) 0.00 - 0.07 K/uL  Urinalysis, Routine w reflex microscopic -Urine, Clean Catch     Status: Abnormal   Collection Time: 02/14/23  6:54 PM  Result Value Ref Range   Color, Urine YELLOW (A) YELLOW   APPearance CLEAR (A) CLEAR   Specific Gravity, Urine 1.015 1.005 - 1.030   pH 7.0 5.0 - 8.0   Glucose, UA NEGATIVE NEGATIVE mg/dL   Hgb urine dipstick NEGATIVE NEGATIVE   Bilirubin Urine NEGATIVE NEGATIVE   Ketones, ur 20 (A) NEGATIVE mg/dL   Protein, ur NEGATIVE NEGATIVE mg/dL   Nitrite NEGATIVE NEGATIVE   Leukocytes,Ua SMALL (A) NEGATIVE   RBC / HPF 0-5 0 - 5 RBC/hpf   WBC, UA 11-20 0 - 5 WBC/hpf   Bacteria, UA RARE (A) NONE SEEN   Squamous Epithelial / HPF 0-5 0 - 5 /HPF   Mucus PRESENT  POC urine preg, ED     Status: None   Collection Time: 02/14/23  6:56 PM  Result Value Ref Range   Preg Test, Ur Negative Negative  Lactic acid, plasma     Status: None   Collection Time: 02/14/23  10:29 PM  Result Value Ref Range   Lactic Acid, Venous 1.2 0.5 - 1.9 mmol/L   Basic Metabolic Panel: Recent Labs  Lab 02/08/23 1019 02/13/23 1545 02/14/23 1845  NA 140 132* 134*  K 4.0 3.2* 3.6  CL 107 100 100  CO2 24 23 21*  GLUCOSE 97 113* 161*  BUN 9 5* 6  CREATININE 0.73 0.61 0.76  CALCIUM 9.2 8.6* 9.1   Liver Function Tests: Recent Labs  Lab 02/08/23 1019 02/14/23 1845  AST 15 28  ALT 12 16  ALKPHOS  --  58  BILITOT 0.5 0.8  PROT 6.6 7.4  ALBUMIN  --  3.7   No results for input(s): "LIPASE", "AMYLASE" in the last 168 hours. No results for input(s): "AMMONIA" in the last 168 hours. CBC: Recent Labs  Lab 02/08/23 1019 02/13/23 1545 02/14/23 1845  WBC 10.8 17.5* 18.4*  NEUTROABS  --  14.3* 14.8*  HGB 13.4 12.0 13.8  HCT 39.8 35.0* 41.3  MCV 89.2 87.7 88.2  PLT 271 216 284   Cardiac Enzymes: No results for input(s): "CKTOTAL", "CKMB", "CKMBINDEX", "TROPONINIHS" in the last 168 hours.  BNP (last 3 results) No results for input(s): "PROBNP" in the last 8760 hours. CBG: No results for input(s): "GLUCAP" in the last 168 hours.  Radiological Exams on Admission:  CT ABDOMEN PELVIS WO CONTRAST  Result Date: 02/14/2023 CLINICAL DATA:  Urinary tract infection. Continued fever and back pain. EXAM: CT ABDOMEN AND PELVIS WITHOUT CONTRAST TECHNIQUE: Multidetector CT imaging of the abdomen and pelvis was performed following the standard protocol without IV contrast. RADIATION DOSE REDUCTION: This exam was performed according to the departmental dose-optimization program which includes automated exposure control, adjustment of the mA and/or kV according to patient size and/or use of iterative reconstruction technique. COMPARISON:  03/06/2018 FINDINGS: Lower chest: Lung bases are clear. Hepatobiliary: No focal liver abnormality is seen. Status post cholecystectomy. No biliary dilatation. Pancreas: Unremarkable. No pancreatic ductal dilatation or surrounding inflammatory  changes. Spleen: Normal in size without focal abnormality. Adrenals/Urinary Tract: Adrenal glands are unremarkable. Kidneys are normal, without renal calculi, focal lesion, or hydronephrosis. Bladder wall is thickened, suggesting possible cystitis. Correlate with urinalysis. Stomach/Bowel: Stomach is within normal limits. Appendix appears normal. No evidence of bowel wall thickening, distention, or inflammatory changes. Vascular/Lymphatic: No significant vascular findings are present. No enlarged abdominal or pelvic lymph nodes. Reproductive: Uterus and bilateral adnexa are unremarkable. Other: No abdominal wall hernia or abnormality. No abdominopelvic ascites. Musculoskeletal: Degenerative changes in the spine. IMPRESSION: 1. Diffuse bladder wall thickening may indicate cystitis. Correlate with urinalysis. 2. Otherwise, no acute process demonstrated in the abdomen or pelvis. 3. Mild degenerative changes in the lumbar spine. Electronically Signed   By: Burman Nieves M.D.   On: 02/14/2023 21:22   DG Chest 2 View  Result Date: 02/14/2023 CLINICAL DATA:  Shortness of breath.  Sore throat. EXAM: CHEST - 2 VIEW COMPARISON:  Chest radiograph dated September 12, 2022 FINDINGS: The heart size and mediastinal contours are within normal limits. Both lungs are clear. The visualized skeletal structures are unremarkable. IMPRESSION: No active cardiopulmonary disease. Electronically Signed   By: Larose Hires D.O.   On: 02/14/2023 19:28    EKG: Independently reviewed. Sinus  tachycardia.   Assessment and Plan: * UTI (urinary tract infection) Patient reports symptoms starting on June 28 with treatment with Macrobid started the following day.  Symptoms were mainly urinary frequency and have since then resolved.  Patient is not having any back pain or burning micturition.  Patient did report a single episode of fever yesterday June 2.  None since then.  Antibiotic wise patient is s/p Macrobid and now cefdinir for a single  day after evaluation at [facility] ER on June 2.  At this time CT abd shows cystitis. patient has received ciprofloxacin in the ER.  I will continue with iv ceftriaxoen.  Leukocytosis Likely due to above infections, continue tending to downtrend  Sore throat Has been going on and off for the last 36 hours as per patient.  Patient was evaluated at our [facility] facility yesterday.  Has continued to felt poorly since then.  Has taken cefdinir for this at home.  Basically has failure of outpatinet abx therapy in this regard. Exam is wnl. I will c.w. iv ceftraixone. COVID 19, Flu and RSV were negative yesterday.  A.w. sepsis today with elevated lactic. S.p 1 liter saline. Trending lactate.      Advance Care Planning:   Code Status: Full Code   Consults: None  Family Communication: As per patient  Severity of Illness: The appropriate patient status for this patient is OBSERVATION. Observation status is judged to be reasonable and necessary in order to provide the required intensity of service to ensure the patient's safety. The patient's presenting symptoms, physical exam findings, and initial radiographic and laboratory data in the context of their medical condition is felt to place them at decreased risk for further clinical deterioration. Furthermore, it is anticipated that the patient will be medically stable for discharge from the hospital within 2 midnights of admission.   Author: Nolberto Hanlon, MD 02/14/2023 11:00 PM  For on call review [email or web address].

## 2023-02-14 NOTE — Assessment & Plan Note (Addendum)
Patient reports symptoms starting on June 28 with treatment with Macrobid started the following day.  Symptoms were mainly urinary frequency and have since then resolved.  Patient is not having any back pain or burning micturition.  Patient did report a single episode of fever yesterday June 2.  None since then.  Antibiotic wise patient is s/p Macrobid and now cefdinir for a single day after evaluation at [facility] ER on June 2.  At this time CT abd shows cystitis. patient has received ciprofloxacin in the ER.  I will continue with iv ceftriaxoen.

## 2023-02-14 NOTE — ED Provider Notes (Signed)
[facility] Provider Note   Event Date/Time   First MD Initiated Contact with Patient 02/14/23 2020     (approximate) History  Urinary Tract Infection  HPI Mikayla Briggs is a 42 y.o. female who presents with complaints of continued dysuria after being diagnosed with urinary tract infection yesterday as well as bilateral ear pain, sore throat, and anterior cervical lymph node inflammation.  Patient states that her pain is currently a 10/10 in the head and throat area.  Patient states that she was placed on cefdinir and has only had 2 doses. ROS: Patient currently denies any vision changes, tinnitus, difficulty speaking, facial droop, chest pain, shortness of breath, abdominal pain, nausea/vomiting/diarrhea, dysuria, or numbness/paresthesias in any extremity   Physical Exam  Triage Vital Signs: ED Triage Vitals  Enc Vitals Group     BP 02/14/23 1845 (!) 136/110     Pulse Rate 02/14/23 1845 (!) 125     Resp 02/14/23 1845 (!) 22     Temp 02/14/23 1841 98.7 F (37.1 C)     Temp Source 02/14/23 1841 Oral     SpO2 02/14/23 1845 95 %     Weight 02/14/23 1841 218 lb (98.9 kg)     Height 02/14/23 1841 5' 6.5" (1.689 m)     Head Circumference --      Peak Flow --      Pain Score 02/14/23 1841 10     Pain Loc --      Pain Edu? --      Excl. in GC? --    Most recent vital signs: Vitals:   02/14/23 1841 02/14/23 1845  BP:  (!) 136/110  Pulse:  (!) 125  Resp:  (!) 22  Temp: 98.7 F (37.1 C)   SpO2:  95%   General: Awake, oriented x4. CV:  Good peripheral perfusion.  Resp:  Normal effort.  Abd:  No distention.  Other:  Middle-aged obese Caucasian female laying in bed in no acute distress.  Erythematous posterior oropharynx ED Results / Procedures / Treatments  Labs (all labs ordered are listed, but only abnormal results are displayed) Labs Reviewed  LACTIC ACID, PLASMA - Abnormal; Notable for the following components:      Result Value    Lactic Acid, Venous 3.1 (*)    All other components within normal limits  COMPREHENSIVE METABOLIC PANEL - Abnormal; Notable for the following components:   Sodium 134 (*)    CO2 21 (*)    Glucose, Bld 161 (*)    All other components within normal limits  CBC WITH DIFFERENTIAL/PLATELET - Abnormal; Notable for the following components:   WBC 18.4 (*)    Neutro Abs 14.8 (*)    Monocytes Absolute 1.1 (*)    Abs Immature Granulocytes 0.10 (*)    All other components within normal limits  URINALYSIS, ROUTINE W REFLEX MICROSCOPIC - Abnormal; Notable for the following components:   Color, Urine YELLOW (*)    APPearance CLEAR (*)    Ketones, ur 20 (*)    Leukocytes,Ua SMALL (*)    Bacteria, UA RARE (*)    All other components within normal limits  CULTURE, BLOOD (ROUTINE X 2)  CULTURE, BLOOD (ROUTINE X 2)  LACTIC ACID, PLASMA  POC URINE PREG, ED   RADIOLOGY ED MD interpretation: 2 view chest x-ray interpreted by me shows no evidence of acute abnormalities including no pneumonia, pneumothorax, or widened mediastinum -Agree with radiology assessment Official radiology report(s): CT ABDOMEN  PELVIS WO CONTRAST  Result Date: 02/14/2023 CLINICAL DATA:  Urinary tract infection. Continued fever and back pain. EXAM: CT ABDOMEN AND PELVIS WITHOUT CONTRAST TECHNIQUE: Multidetector CT imaging of the abdomen and pelvis was performed following the standard protocol without IV contrast. RADIATION DOSE REDUCTION: This exam was performed according to the departmental dose-optimization program which includes automated exposure control, adjustment of the mA and/or kV according to patient size and/or use of iterative reconstruction technique. COMPARISON:  03/06/2018 FINDINGS: Lower chest: Lung bases are clear. Hepatobiliary: No focal liver abnormality is seen. Status post cholecystectomy. No biliary dilatation. Pancreas: Unremarkable. No pancreatic ductal dilatation or surrounding inflammatory changes. Spleen:  Normal in size without focal abnormality. Adrenals/Urinary Tract: Adrenal glands are unremarkable. Kidneys are normal, without renal calculi, focal lesion, or hydronephrosis. Bladder wall is thickened, suggesting possible cystitis. Correlate with urinalysis. Stomach/Bowel: Stomach is within normal limits. Appendix appears normal. No evidence of bowel wall thickening, distention, or inflammatory changes. Vascular/Lymphatic: No significant vascular findings are present. No enlarged abdominal or pelvic lymph nodes. Reproductive: Uterus and bilateral adnexa are unremarkable. Other: No abdominal wall hernia or abnormality. No abdominopelvic ascites. Musculoskeletal: Degenerative changes in the spine. IMPRESSION: 1. Diffuse bladder wall thickening may indicate cystitis. Correlate with urinalysis. 2. Otherwise, no acute process demonstrated in the abdomen or pelvis. 3. Mild degenerative changes in the lumbar spine. Electronically Signed   By: Burman Nieves M.D.   On: 02/14/2023 21:22   DG Chest 2 View  Result Date: 02/14/2023 CLINICAL DATA:  Shortness of breath.  Sore throat. EXAM: CHEST - 2 VIEW COMPARISON:  Chest radiograph dated September 12, 2022 FINDINGS: The heart size and mediastinal contours are within normal limits. Both lungs are clear. The visualized skeletal structures are unremarkable. IMPRESSION: No active cardiopulmonary disease. Electronically Signed   By: Larose Hires D.O.   On: 02/14/2023 19:28   PROCEDURES: Critical Care performed: Yes, see critical care procedure note(s) .1-3 Lead EKG Interpretation  Performed by: Merwyn Katos, MD Authorized by: Merwyn Katos, MD     Interpretation: abnormal     ECG rate:  124   ECG rate assessment: tachycardic     Rhythm: sinus tachycardia     Ectopy: none     Conduction: normal   CRITICAL CARE Performed by: Merwyn Katos  Total critical care time: 31 minutes  Critical care time was exclusive of separately billable procedures and treating  other patients.  Critical care was necessary to treat or prevent imminent or life-threatening deterioration.  Critical care was time spent personally by me on the following activities: development of treatment plan with patient and/or surrogate as well as nursing, discussions with consultants, evaluation of patient's response to treatment, examination of patient, obtaining history from patient or surrogate, ordering and performing treatments and interventions, ordering and review of laboratory studies, ordering and review of radiographic studies, pulse oximetry and re-evaluation of patient's condition.  MEDICATIONS ORDERED IN ED: Medications  ciprofloxacin (CIPRO) IVPB 400 mg (400 mg Intravenous New Bag/Given 02/14/23 2255)  sodium chloride 0.9 % bolus 1,000 mL (1,000 mLs Intravenous New Bag/Given 02/14/23 2054)   IMPRESSION / MDM / ASSESSMENT AND PLAN / ED COURSE  I reviewed the triage vital signs and the nursing notes.                             The patient is on the cardiac monitor to evaluate for evidence of arrhythmia and/or significant heart rate changes.  Patient's presentation is most consistent with acute presentation with potential threat to life or bodily function.  This patient presents to the ED for concern of dysuria, sinus pain, rhinorrhea, bilateral ear pain, sore throat, this involves an extensive number of treatment options, and is a complaint that carries with it a high risk of complications and morbidity.  The differential diagnosis includes urosepsis, strep throat, URI Co morbidities that complicate the patient evaluation  Hypertension, obesity Additional history obtained:  External records from outside source obtained and reviewed including last PCP office visit on 02/08/2023 Lab Tests:  I Ordered, and personally interpreted labs.  The pertinent results include: 818.4 WBC, CO2 21, urinalysis showing small leukocytes and rare bacteria Imaging Studies ordered:  I ordered  imaging studies including CT of the abdomen and pelvis without IV contrast  I independently visualized and interpreted imaging which showed certain parenteral bladder wall thickening concerning for cystitis  I agree with the radiologist interpretation Cardiac Monitoring: / EKG:  The patient was maintained on a cardiac monitor.  I personally viewed and interpreted the cardiac monitored which showed an underlying rhythm of: Tachycardic sinus rhythm Consultations Obtained:  I requested consultation with the hospitalist team,  and discussed lab and imaging findings as well as pertinent plan - they recommend: Admission Problem List / ED Course / Critical interventions / Medication management  Urosepsis, URI  I ordered medication including ciprofloxacin for UTI  Reevaluation of the patient after these medicines showed that the patient stayed the same  I have reviewed the patients home medicines and have made adjustments as needed Dispo: Admit to medicine       FINAL CLINICAL IMPRESSION(S) / ED DIAGNOSES   Final diagnoses:  Sepsis without acute organ dysfunction, due to unspecified organism [facility])  Urinary tract infection without hematuria, site unspecified   Rx / DC Orders   ED Discharge Orders     None      Note:  This document was prepared using Dragon voice recognition software and may include unintentional dictation errors.   Merwyn Katos, MD 02/14/23 [phone]

## 2023-02-14 NOTE — ED Triage Notes (Signed)
Patient c/o sob, sore throat, and reports lymph nodes swollen and hurting in neck.   Reports she was seen at [facility] yesterday and diagnosed with UTI and discharged with oral antibiotic prescription. She has taken two doses.

## 2023-02-15 DIAGNOSIS — N3000 Acute cystitis without hematuria: Secondary | ICD-10-CM | POA: Diagnosis not present

## 2023-02-15 LAB — BASIC METABOLIC PANEL
Anion gap: 10 (ref 5–15)
BUN: 5 mg/dL — ABNORMAL LOW (ref 6–20)
CO2: 21 mmol/L — ABNORMAL LOW (ref 22–32)
Calcium: 8.5 mg/dL — ABNORMAL LOW (ref 8.9–10.3)
Chloride: 105 mmol/L (ref 98–111)
Creatinine, Ser: 0.54 mg/dL (ref 0.44–1.00)
GFR, Estimated: >60 mL/min (ref >60)
Glucose, Bld: 114 mg/dL — ABNORMAL HIGH (ref 70–99)
Potassium: 3.8 mmol/L (ref 3.5–5.1)
Sodium: 136 mmol/L (ref 135–145)

## 2023-02-15 LAB — CBC
HCT: 39.1 % (ref 36.0–46.0)
Hemoglobin: 12.8 g/dL (ref 12.0–15.0)
MCH: 29.6 pg (ref 26.0–34.0)
MCHC: 32.7 g/dL (ref 30.0–36.0)
MCV: 90.5 fL (ref 80.0–100.0)
Platelets: 242 10*3/uL (ref 150–400)
RBC: 4.32 MIL/uL (ref 3.87–5.11)
RDW: 12.7 % (ref 11.5–15.5)
WBC: 14.8 10*3/uL — ABNORMAL HIGH (ref 4.0–10.5)
nRBC: 0 % (ref 0.0–0.2)

## 2023-02-15 MED ORDER — CETIRIZINE HCL 10 MG PO TABS: 10.0000 mg | ORAL_TABLET | Freq: Every evening | ORAL | Status: DC

## 2023-02-15 MED ORDER — BUTALBITAL-APAP-CAFFEINE 50-325-40 MG PO TABS
1.0000 | ORAL_TABLET | ORAL | Status: DC | PRN
  Administered 2023-02-15: 1 via ORAL
  Filled 2023-02-15: qty 1

## 2023-02-15 MED ORDER — MONTELUKAST SODIUM 10 MG PO TABS
10.0000 mg | ORAL_TABLET | Freq: Every day | ORAL | Status: DC
  Administered 2023-02-15: 10 mg via ORAL
  Filled 2023-02-15: qty 1

## 2023-02-15 MED ORDER — SODIUM CHLORIDE 0.9% FLUSH
3.0000 mL | Freq: Two times a day (BID) | INTRAVENOUS | Status: DC
  Administered 2023-02-15: 3 mL via INTRAVENOUS

## 2023-02-15 MED ORDER — DROSPIRENONE 4 MG PO TABS: 4.0000 mg | ORAL_TABLET | Freq: Every day | ORAL | Status: DC

## 2023-02-15 MED ORDER — LACTATED RINGERS IV SOLN: INTRAVENOUS | Status: DC

## 2023-02-15 MED ORDER — ACETAMINOPHEN 325 MG PO TABS: 650.0000 mg | ORAL_TABLET | Freq: Four times a day (QID) | ORAL | Status: DC | PRN

## 2023-02-15 MED ORDER — AMITRIPTYLINE HCL 50 MG PO TABS
25.0000 mg | ORAL_TABLET | Freq: Every day | ORAL | Status: DC
  Administered 2023-02-15: 25 mg via ORAL
  Filled 2023-02-15: qty 1

## 2023-02-15 MED ORDER — PHENTERMINE HCL 37.5 MG PO TABS: 37.5000 mg | ORAL_TABLET | Freq: Every day | ORAL | Status: DC

## 2023-02-15 MED ORDER — ACETAMINOPHEN 325 MG RE SUPP: 650.0000 mg | Freq: Four times a day (QID) | RECTAL | Status: DC | PRN

## 2023-02-15 MED ORDER — ENOXAPARIN SODIUM 60 MG/0.6ML IJ SOSY: 0.5000 mg/kg | PREFILLED_SYRINGE | INTRAMUSCULAR | Status: DC

## 2023-02-15 NOTE — Progress Notes (Signed)
Anticoagulation monitoring(Lovenox):  42 yo  female ordered Lovenox 40 mg Q24h    Filed Weights   02/14/23 1841  Weight: 98.9 kg (218 lb)   BMI 34.7    Lab Results  Component Value Date   CREATININE 0.76 02/14/2023   CREATININE 0.61 02/13/2023   CREATININE 0.73 02/08/2023   Estimated Creatinine Clearance: 110.9 mL/min (by C-G formula based on SCr of 0.76 mg/dL). Hemoglobin & Hematocrit     Component Value Date/Time   HGB 13.8 02/14/2023 1845   HGB 14.5 01/02/2014 1152   HCT 41.3 02/14/2023 1845   HCT 43.6 01/02/2014 1152     Per Protocol for Patient with estCrcl > 30 ml/min and BMI > 30, will transition to Lovenox 50 mg Q24h.

## 2023-02-15 NOTE — ED Notes (Signed)
Pt provided with 2 blankets per request

## 2023-02-15 NOTE — Discharge Summary (Signed)
Physician Discharge Summary   Patient: Mikayla Briggs MRN: [id number] DOB: 1981/05/23  Admit date:     02/14/2023  Discharge date: 02/15/23  Discharge Physician: Arnetha Courser   PCP: Lorre Munroe, NP   Recommendations at discharge:  Please obtain CBC and BMP in 1 week Patient has intermittent mild elevation of blood pressure, please monitor and manage accordingly Follow-up with primary care provider within a week  Discharge Diagnoses: Principal Problem:   UTI (urinary tract infection) Active Problems:   Sore throat   Sepsis (HCC)   Leukocytosis   Hospital Course: Taken from H&P.  Mikayla Briggs is a 42 y.o. female with past medical history significant for seasonal allergies and hypertension not taking any medication at home, recent diagnosis of UTI for which she was taking Macrobid With complaint of sudden onset subjective fever, generalized weakness and sore throat. No trismus, no nausea or vomiting, no other upper respiratory or abdominal symptoms.  She was recently seen at [facility] ER where she received fluid and discharged home on cefdinir which she has not taken.  ED course and data reviewed.  Vital with mildly elevated blood pressure and tachycardia.  Labs pertinent for her leukocytosis at 18.4, mild none anion gap lactic acidosis with bicarb at 21, lactic acid at 3.1, UA with mild pyuria. CT abdomen and pelvis with diffuse bladder wall thickening may indicate cystitis, otherwise no other acute process.  Chest x-ray negative for any acute abnormality.  Recent respiratory panel was negative for COVID, RSV and flu.  Patient received a dose of ciprofloxacin.  6/4: Blood pressure mildly elevated at 140/106.  Lactic acidosis resolved.  Preliminary blood cultures negative in 12 hours.  Leukocytosis improving.  Patient clinically improved with no complaints and wants to go home. Although penicillin allergy was listed as anaphylaxis but patient has  taken cephalosporins in the past and already took 2 doses of cefdinir which was recently given to her at [facility] ER.  She was advised to complete that course.  She was having intermittent mild hypertension not on any medications.  She was advised to check her blood pressure regularly at home while resting and take that log to her PCP for further management.  She will continue on current medications and need to have a close follow-up with her providers for further recommendations.  Assessment and Plan: * UTI (urinary tract infection) Patient reports symptoms starting on June 28 with treatment with Macrobid started the following day.  Symptoms were mainly urinary frequency and have since then resolved.  Patient is not having any back pain or burning micturition.  Patient did report a single episode of fever yesterday June 2.  None since then.  Antibiotic wise patient is s/p Macrobid and now cefdinir for a single day after evaluation at [facility] ER on June 2.  At this time CT abd shows cystitis. patient has received ciprofloxacin in the ER.  I will continue with iv ceftriaxoen.  Leukocytosis Likely due to above infections, continue tending to downtrend  Sore throat Has been going on and off for the last 36 hours as per patient.  Patient was evaluated at our [facility] facility yesterday.  Has continued to felt poorly since then.  Has taken cefdinir for this at home.  Basically has failure of outpatinet abx therapy in this regard. Exam is wnl. I will c.w. iv ceftraixone. COVID 19, Flu and RSV were negative yesterday.  A.w. sepsis today with elevated lactic. S.p 1 liter saline. Trending lactate.  Consultants: None Procedures performed: None Disposition: Home Diet recommendation:  Discharge Diet Orders (From admission, onward)     Start     Ordered   02/15/23 0000  Diet - low sodium heart healthy        02/15/23 0947           Cardiac diet DISCHARGE MEDICATION: Allergies as of 02/15/2023        Reactions   Iodine Itching, Swelling   Ivp Dye [iodinated Contrast Media] Hives, Rash   Dripped IVP dye on her arm after taking an IV out of a patient and pt reported she had rash on arm within 15 mins. Updated 12/30/22 after s/w pt.    Omeprazole Anaphylaxis, Shortness Of Breath   Penicillins Anaphylaxis, Shortness Of Breath   Has patient had a PCN reaction causing immediate rash, facial/tongue/throat swelling, SOB or lightheadedness with hypotension: Yes Has patient had a PCN reaction causing severe rash involving mucus membranes or skin necrosis: No Has patient had a PCN reaction that required hospitalization: Unknown Has patient had a PCN reaction occurring within the last 10 years: No If all of the above answers are "NO", then may proceed with Cephalosporin use.   Shellfish Allergy Anaphylaxis, Shortness Of Breath   Almond (diagnostic)    Soap Itching   ANYTHING WITH FRAGRANCE        Medication List     STOP taking these medications    cetirizine 10 MG tablet Commonly known as: ZYRTEC   methocarbamol 500 MG tablet Commonly known as: ROBAXIN       TAKE these medications    amitriptyline 25 MG tablet Commonly known as: ELAVIL Take 1 tablet (25 mg total) by mouth at bedtime.   Butalbital-APAP-Caffeine 50-300-40 MG Caps Take 1 capsule by mouth every 4 (four) hours as needed.   cefdinir 300 MG capsule Commonly known as: OMNICEF Take 1 capsule (300 mg total) by mouth 2 (two) times daily for 7 days.   levocetirizine 5 MG tablet Commonly known as: XYZAL TAKE 1 TABLET BY MOUTH ONCE A DAY EVERY EVENING.   montelukast 10 MG tablet Commonly known as: SINGULAIR Take 1 tablet (10 mg total) by mouth at bedtime.   phentermine 37.5 MG tablet Commonly known as: ADIPEX-P Take 37.5 mg by mouth daily before breakfast.   Slynd 4 MG Tabs Generic drug: Drospirenone Take 4 mg by mouth at bedtime.   Vitamins/Minerals Tabs Take by mouth.        Follow-up  Information     Lorre Munroe, NP. Schedule an appointment as soon as possible for a visit in 1 week(s).   Specialties: Internal Medicine, Emergency Medicine Contact information: [address] [place] [address] [phone]                Discharge Exam: Ceasar Mons Weights   02/14/23 1841  Weight: 98.9 kg   General.  Overweight lady, in no acute distress. Pulmonary.  Lungs clear bilaterally, normal respiratory effort. CV.  Regular rate and rhythm, no JVD, rub or murmur. Abdomen.  Soft, nontender, nondistended, BS positive. CNS.  Alert and oriented .  No focal neurologic deficit. Extremities.  No edema, no cyanosis, pulses intact and symmetrical. Psychiatry.  Judgment and insight appears normal.   Condition at discharge: stable  The results of significant diagnostics from this hospitalization (including imaging, microbiology, ancillary and laboratory) are listed below for reference.   Imaging Studies: CT ABDOMEN PELVIS WO CONTRAST  Result Date: 02/14/2023 CLINICAL DATA:  Urinary tract infection. Continued fever and back pain. EXAM: CT ABDOMEN AND PELVIS WITHOUT CONTRAST TECHNIQUE: Multidetector CT imaging of the abdomen and pelvis was performed following the standard protocol without IV contrast. RADIATION DOSE REDUCTION: This exam was performed according to the departmental dose-optimization program which includes automated exposure control, adjustment of the mA and/or kV according to patient size and/or use of iterative reconstruction technique. COMPARISON:  03/06/2018 FINDINGS: Lower chest: Lung bases are clear. Hepatobiliary: No focal liver abnormality is seen. Status post cholecystectomy. No biliary dilatation. Pancreas: Unremarkable. No pancreatic ductal dilatation or surrounding inflammatory changes. Spleen: Normal in size without focal abnormality. Adrenals/Urinary Tract: Adrenal glands are unremarkable. Kidneys are normal, without renal calculi, focal lesion, or  hydronephrosis. Bladder wall is thickened, suggesting possible cystitis. Correlate with urinalysis. Stomach/Bowel: Stomach is within normal limits. Appendix appears normal. No evidence of bowel wall thickening, distention, or inflammatory changes. Vascular/Lymphatic: No significant vascular findings are present. No enlarged abdominal or pelvic lymph nodes. Reproductive: Uterus and bilateral adnexa are unremarkable. Other: No abdominal wall hernia or abnormality. No abdominopelvic ascites. Musculoskeletal: Degenerative changes in the spine. IMPRESSION: 1. Diffuse bladder wall thickening may indicate cystitis. Correlate with urinalysis. 2. Otherwise, no acute process demonstrated in the abdomen or pelvis. 3. Mild degenerative changes in the lumbar spine. Electronically Signed   By: Burman Nieves M.D.   On: 02/14/2023 21:22   DG Chest 2 View  Result Date: 02/14/2023 CLINICAL DATA:  Shortness of breath.  Sore throat. EXAM: CHEST - 2 VIEW COMPARISON:  Chest radiograph dated September 12, 2022 FINDINGS: The heart size and mediastinal contours are within normal limits. Both lungs are clear. The visualized skeletal structures are unremarkable. IMPRESSION: No active cardiopulmonary disease. Electronically Signed   By: Larose Hires D.O.   On: 02/14/2023 19:28    Microbiology: Results for orders placed or performed during the hospital encounter of 02/14/23  Culture, blood (routine x 2)     Status: None (Preliminary result)   Collection Time: 02/14/23 10:29 PM   Specimen: BLOOD  Result Value Ref Range Status   Specimen Description BLOOD  LEFT [facility]  Final   Special Requests   Final    BOTTLES DRAWN AEROBIC AND ANAEROBIC Blood Culture adequate volume   Culture   Final    NO GROWTH < 12 HOURS Performed at [facility], [address] [place], [place] [address]    Report Status PENDING  Incomplete  Culture, blood (routine x 2)     Status: None (Preliminary result)   Collection Time: 02/14/23 10:42 PM    Specimen: BLOOD  Result Value Ref Range Status   Specimen Description BLOOD  RIGHT HAND  Final   Special Requests   Final    BOTTLES DRAWN AEROBIC AND ANAEROBIC Blood Culture adequate volume   Culture   Final    NO GROWTH < 12 HOURS Performed at [facility], [address] [place], [place] [address]    Report Status PENDING  Incomplete    Labs: CBC: Recent Labs  Lab 02/08/23 1019 02/13/23 1545 02/14/23 1845 02/15/23 0609  WBC 10.8 17.5* 18.4* 14.8*  NEUTROABS  --  14.3* 14.8*  --   HGB 13.4 12.0 13.8 12.8  HCT 39.8 35.0* 41.3 39.1  MCV 89.2 87.7 88.2 90.5  PLT 271 216 284 242   Basic Metabolic Panel: Recent Labs  Lab 02/08/23 1019 02/13/23 1545 02/14/23 1845 02/15/23 0609  NA 140 132* 134* 136  K 4.0 3.2* 3.6 3.8  CL  107 100 100 105  CO2 24 23 21* 21*  GLUCOSE 97 113* 161* 114*  BUN 9 5* 6 5*  CREATININE 0.73 0.61 0.76 0.54  CALCIUM 9.2 8.6* 9.1 8.5*   Liver Function Tests: Recent Labs  Lab 02/08/23 1019 02/14/23 1845  AST 15 28  ALT 12 16  ALKPHOS  --  58  BILITOT 0.5 0.8  PROT 6.6 7.4  ALBUMIN  --  3.7   CBG: No results for input(s): "GLUCAP" in the last 168 hours.  Discharge time spent: greater than 30 minutes.  This record has been created using Conservation officer, historic buildings. Errors have been sought and corrected,but may not always be located. Such creation errors do not reflect on the standard of care.   Signed: Arnetha Courser, MD [facility] 02/15/2023

## 2023-02-15 NOTE — Hospital Course (Addendum)
Taken from H&P.  Mikayla Briggs is a 42 y.o. female with past medical history significant for seasonal allergies and hypertension not taking any medication at home, recent diagnosis of UTI for which she was taking Macrobid With complaint of sudden onset subjective fever, generalized weakness and sore throat. No trismus, no nausea or vomiting, no other upper respiratory or abdominal symptoms.  She was recently seen at [facility] ER where she received fluid and discharged home on cefdinir which she has not taken.  ED course and data reviewed.  Vital with mildly elevated blood pressure and tachycardia.  Labs pertinent for her leukocytosis at 18.4, mild none anion gap lactic acidosis with bicarb at 21, lactic acid at 3.1, UA with mild pyuria. CT abdomen and pelvis with diffuse bladder wall thickening may indicate cystitis, otherwise no other acute process.  Chest x-ray negative for any acute abnormality.  Recent respiratory panel was negative for COVID, RSV and flu.  Patient received a dose of ciprofloxacin.  6/4: Blood pressure mildly elevated at 140/106.  Lactic acidosis resolved.  Preliminary blood cultures negative in 12 hours.  Leukocytosis improving.  Patient clinically improved with no complaints and wants to go home. Although penicillin allergy was listed as anaphylaxis but patient has taken cephalosporins in the past and already took 2 doses of cefdinir which was recently given to her at [facility] ER.  She was advised to complete that course.  She was having intermittent mild hypertension not on any medications.  She was advised to check her blood pressure regularly at home while resting and take that log to her PCP for further management.  She will continue on current medications and need to have a close follow-up with her providers for further recommendations.

## 2023-02-16 ENCOUNTER — Telehealth: Payer: Self-pay

## 2023-02-16 ENCOUNTER — Other Ambulatory Visit: Payer: Self-pay | Admitting: Internal Medicine

## 2023-02-16 DIAGNOSIS — Z9109 Other allergy status, other than to drugs and biological substances: Secondary | ICD-10-CM

## 2023-02-16 LAB — HIV ANTIBODY (ROUTINE TESTING W REFLEX): HIV Screen 4th Generation wRfx: NONREACTIVE

## 2023-02-16 LAB — CULTURE, BLOOD (ROUTINE X 2)
Culture: NO GROWTH
Special Requests: ADEQUATE

## 2023-02-16 NOTE — Transitions of Care (Post Inpatient/ED Visit) (Signed)
   02/16/2023  Name: Mikayla Briggs MRN: [id number] DOB: May 20, 1981  Today's TOC FU Call Status: Today's TOC FU Call Status:: Successful TOC FU Call Competed TOC FU Call Complete Date: 02/16/23  Transition Care Management Follow-up Telephone Call Date of Discharge: 02/15/23 Discharge Facility: [facility]) Type of Discharge: Inpatient Admission Primary Inpatient Discharge Diagnosis:: Sepsis without acute organ dysfunction How have you been since you were released from the hospital?: Better Any questions or concerns?: No  Items Reviewed: Did you receive and understand the discharge instructions provided?: Yes Medications obtained,verified, and reconciled?: Yes (Medications Reviewed) Any new allergies since your discharge?: No Dietary orders reviewed?: Yes Do you have support at home?: Yes  Medications Reviewed Today: Medications Reviewed Today     Reviewed by Merleen Nicely, LPN (Licensed Practical Nurse) on 02/16/23 at 1336  Med List Status: <None>   Medication Order Taking? Sig Documenting Provider Last Dose Status Informant  amitriptyline (ELAVIL) 25 MG tablet [id number] Yes Take 1 tablet (25 mg total) by mouth at bedtime. Lorre Munroe, NP Taking Active Self  Butalbital-APAP-Caffeine 50-300-40 MG CAPS [id number] Yes Take 1 capsule by mouth every 4 (four) hours as needed. [provider] Taking Active Self  cefdinir (OMNICEF) 300 MG capsule [id number] Yes Take 1 capsule (300 mg total) by mouth 2 (two) times daily for 7 days. Melene Plan, DO Taking Active Self  Drospirenone (SLYND) 4 MG TABS [id number] Yes Take 4 mg by mouth at bedtime. [provider] Taking Active Self  levocetirizine (XYZAL) 5 MG tablet [id number] Yes TAKE 1 TABLET BY MOUTH ONCE A DAY EVERY EVENING. Lorre Munroe, NP Taking Active Self  montelukast (SINGULAIR) 10 MG tablet [id number] Yes Take 1 tablet (10 mg total) by mouth at bedtime. Lorre Munroe,  NP Taking Active Self  phentermine (ADIPEX-P) 37.5 MG tablet [id number] Yes Take 37.5 mg by mouth daily before breakfast. [provider] Taking Active Self  Vitamins/Minerals TABS [id number] No Take by mouth.  Patient not taking: Reported on 02/14/2023   [provider] Not Taking Active Self            Home Care and Equipment/Supplies: Were Home Health Services Ordered?: No Any new equipment or medical supplies ordered?: No  Functional Questionnaire: Do you need assistance with bathing/showering or dressing?: No Do you need assistance with meal preparation?: No Do you need assistance with eating?: No Do you have difficulty maintaining continence: No Do you need assistance with getting out of bed/getting out of a chair/moving?: No Do you have difficulty managing or taking your medications?: No  Follow up appointments reviewed: PCP Follow-up appointment confirmed?: Yes Date of PCP follow-up appointment?: 02/21/23 Follow-up Provider: Nicki Reaper NP [facility] Follow-up appointment confirmed?: No Do you need transportation to your follow-up appointment?: No Do you understand care options if your condition(s) worsen?: Yes-patient verbalized understanding    SIGNATURE  Woodfin Ganja LPN [facility] Nurse Health Advisor Direct Dial [phone]

## 2023-02-17 LAB — CULTURE, BLOOD (ROUTINE X 2)

## 2023-02-17 NOTE — Telephone Encounter (Signed)
Requested Prescriptions  Pending Prescriptions Disp Refills   amitriptyline (ELAVIL) 25 MG tablet [Pharmacy Med Name: AMITRIPTYLINE HCL 25 MG TAB] 90 tablet 1    Sig: Take 1 tablet (25 mg total) by mouth at bedtime.     Psychiatry:  Antidepressants - Heterocyclics (TCAs) Passed - 02/16/2023  8:22 PM      Passed - Valid encounter within last 6 months    Recent Outpatient Visits           1 week ago Encounter for general adult medical examination with abnormal findings   [place] [facility] [place], [place] W, NP   2 months ago Acute midline low back pain with left-sided sciatica   [place] [facility] [place], Salvadore Oxford, NP   5 months ago Multifocal pneumonia   [place] [facility] [place], Salvadore Oxford, NP   7 months ago Environmental allergies   [place] [facility] [place], Salvadore Oxford, NP   7 months ago Acute bronchitis, unspecified organism   [facility] [place], Salvadore Oxford, NP       Future Appointments             In 4 days [place], Salvadore Oxford, NP [place] [facility]   In 5 months [place], Salvadore Oxford, NP [facility]             montelukast (SINGULAIR) 10 MG tablet [Pharmacy Med Name: MONTELUKAST SOD 10 MG TABLET] 90 tablet 1    Sig: Take 1 tablet (10 mg total) by mouth at bedtime.     Pulmonology:  Leukotriene Inhibitors Passed - 02/16/2023  8:22 PM      Passed - Valid encounter within last 12 months    Recent Outpatient Visits           1 week ago Encounter for general adult medical examination with abnormal findings   [place] [facility] [place], [place] W, NP   2 months ago Acute midline low back pain with left-sided sciatica   [place] [place], Salvadore Oxford, NP   5 months ago Multifocal pneumonia   [place] [facility] [place], Salvadore Oxford, NP   7 months ago Environmental  allergies   [place] [facility] [place], Salvadore Oxford, NP   7 months ago Acute bronchitis, unspecified organism   [facility] [place], Salvadore Oxford, NP       Future Appointments             In 4 days [place], Salvadore Oxford, NP [place] [facility]   In 5 months [place], Salvadore Oxford, NP [facility], [facility]             levocetirizine (XYZAL) 5 MG tablet [Pharmacy Med Name: LEVOCETIRIZINE 5 MG TABLET] 90 tablet 1    Sig: Take 1 tablet by mouth daily in the evening     Ear, Nose, and Throat:  Antihistamines - levocetirizine dihydrochloride Passed - 02/16/2023  8:22 PM      Passed - Cr in normal range and within 360 days    Creat  Date Value Ref Range Status  02/08/2023 0.73 0.50 - 0.99 mg/dL Final   Creatinine, Ser  Date Value Ref Range Status  02/15/2023 0.54 0.44 - 1.00 mg/dL Final   Creatinine, Urine  Date Value Ref Range Status  02/23/2019 15.47 mg/dL Final         Passed - eGFR is 10 or above and within 360 days    EGFR (African American)  Date Value Ref Range Status  01/02/2014 >60  Final   GFR calc Af Amer  Date Value Ref Range Status  02/23/2019 >60 >60 mL/min Final   EGFR (Non-African Amer.)  Date Value Ref Range Status  01/02/2014 >60  Final    Comment:    eGFR values <56mL/min/1.73 m2 may be an indication of chronic kidney disease (CKD). Calculated eGFR is useful in patients with stable renal function. The eGFR calculation will not be reliable in acutely ill patients when serum creatinine is changing rapidly. It is not useful in  patients on dialysis. The eGFR calculation may not be applicable to patients at the low and high extremes of body sizes, pregnant women, and vegetarians.    GFR, Estimated  Date Value Ref Range Status  02/15/2023 >60 >60 mL/min Final    Comment:    (NOTE) Calculated using the CKD-EPI Creatinine Equation (2021)    GFR  Date Value Ref Range Status   09/18/2020 100.39 >60.00 mL/min Final    Comment:    Calculated using the CKD-EPI Creatinine Equation (2021)   eGFR  Date Value Ref Range Status  02/08/2023 106 > OR = 60 mL/min/1.66m2 Final         Passed - Valid encounter within last 12 months    Recent Outpatient Visits           1 week ago Encounter for general adult medical examination with abnormal findings   [place] [facility] [place], [place] W, NP   2 months ago Acute midline low back pain with left-sided sciatica   [place] [facility] [place], Salvadore Oxford, NP   5 months ago Multifocal pneumonia   [place] [facility] [place], Salvadore Oxford, NP   7 months ago Environmental allergies   [place] [facility] [place], Salvadore Oxford, NP   7 months ago Acute bronchitis, unspecified organism   [facility] [place], Salvadore Oxford, NP       Future Appointments             In 4 days [place], Salvadore Oxford, NP  [facility]   In 5 months [place], Salvadore Oxford, NP [facility] [place]

## 2023-02-17 NOTE — Telephone Encounter (Signed)
Will discuss at upcoming appt.

## 2023-02-18 LAB — CULTURE, BLOOD (ROUTINE X 2): Special Requests: ADEQUATE

## 2023-02-19 LAB — CULTURE, BLOOD (ROUTINE X 2): Culture: NO GROWTH

## 2023-02-21 ENCOUNTER — Encounter: Payer: Self-pay | Admitting: Internal Medicine

## 2023-02-21 ENCOUNTER — Ambulatory Visit: Payer: Managed Care, Other (non HMO) | Admitting: Internal Medicine

## 2023-02-21 VITALS — BP 134/82 | HR 103 | Temp 96.8°F | Wt 222.0 lb

## 2023-02-21 DIAGNOSIS — N3000 Acute cystitis without hematuria: Secondary | ICD-10-CM | POA: Diagnosis not present

## 2023-02-21 DIAGNOSIS — A419 Sepsis, unspecified organism: Secondary | ICD-10-CM

## 2023-02-21 DIAGNOSIS — B9689 Other specified bacterial agents as the cause of diseases classified elsewhere: Secondary | ICD-10-CM | POA: Diagnosis not present

## 2023-02-21 DIAGNOSIS — J329 Chronic sinusitis, unspecified: Secondary | ICD-10-CM

## 2023-02-21 LAB — CBC
HCT: 40.9 % (ref 35.0–45.0)
Hemoglobin: 13.4 g/dL (ref 11.7–15.5)
MCH: 29.4 pg (ref 27.0–33.0)
MCHC: 32.8 g/dL (ref 32.0–36.0)
MCV: 89.7 fL (ref 80.0–100.0)
MPV: 10.1 fL (ref 7.5–12.5)
Platelets: 355 10*3/uL (ref 140–400)
RBC: 4.56 10*6/uL (ref 3.80–5.10)
RDW: 12.4 % (ref 11.0–15.0)
WBC: 12.8 10*3/uL — ABNORMAL HIGH (ref 3.8–10.8)

## 2023-02-21 LAB — POCT URINALYSIS DIPSTICK
Bilirubin, UA: NEGATIVE
Glucose, UA: NEGATIVE
Ketones, UA: NEGATIVE
Nitrite, UA: NEGATIVE
Protein, UA: NEGATIVE
Spec Grav, UA: <=1.005 — AB (ref 1.010–1.025)
Urobilinogen, UA: 0.2 E.U./dL
pH, UA: 6 (ref 5.0–8.0)

## 2023-02-21 NOTE — Addendum Note (Signed)
Addended by: Kavin Leech E on: 02/21/2023 10:43 AM   Modules accepted: Orders

## 2023-02-21 NOTE — Progress Notes (Addendum)
Subjective:    Patient ID: Mikayla Briggs, female    DOB: November 20, 1980, 42 y.o.   MRN: [id number]  HPI  Pt presents to the clinic today for multiple ER follow up. She presented to the ER 6/2 with c/o tongue swelling after eating almonds.  She also complained of some sinus pressure and urinary symptoms.  Labs revealed a WBC count of 17.6, slightly low potassium at 3.2.  Urinalysis showed small leukocytes.  She was given IV fluids and a prescription for Omnicef.  She had been on Macrobid prior from an e-visit.  She was discharged but returned to the ER 6/3 with complaint of persistent urinary and sinus symptoms.  Labs revealed slightly higher WBC count of 18.4, elevated lactate of 3.1.  Chest x-ray was normal.  ECG was normal.  CT abdomen/pelvis showed:  IMPRESSION: 1. Diffuse bladder wall thickening may indicate cystitis. Correlate with urinalysis. 2. Otherwise, no acute process demonstrated in the abdomen or pelvis. 3. Mild degenerative changes in the lumbar spine. Electronically Signed By: Burman Nieves M.D.   She was treated with IV fluids and IV Cipro.  She was discharged, advised to continue taking the [facility] and advised to follow-up with her PCP.  Since that time, she is not having any urinary or sinus symptoms.  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 90 tablet 1   Butalbital-APAP-Caffeine 50-300-40 MG CAPS Take 1 capsule by mouth every 4 (four) hours as needed.     cetirizine (ZYRTEC) 10 MG chewable tablet Chew 10 mg by mouth daily.     Drospirenone (SLYND) 4 MG TABS  Take 4 mg by mouth at bedtime.     levocetirizine (XYZAL) 5 MG tablet Take 1 tablet by mouth daily in the evening 90 tablet 1   montelukast (SINGULAIR) 10 MG tablet Take 1 tablet (10 mg total) by mouth at bedtime. 90 tablet 1   phentermine (ADIPEX-P) 37.5 MG tablet Take 37.5 mg by mouth daily before breakfast.     Vitamins/Minerals TABS Take by mouth.     No current facility-administered medications for this visit.    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 Updated 12/30/22 after s/w pt.    Omeprazole Anaphylaxis and Shortness Of Breath   Penicillins Anaphylaxis and Shortness Of Breath    Has patient had a PCN reaction causing immediate rash, facial/tongue/throat swelling, SOB or lightheadedness with hypotension: Yes Has patient had a PCN reaction causing severe rash involving mucus membranes or skin necrosis: No Has patient had a PCN reaction that required hospitalization: Unknown Has patient had a PCN reaction occurring within the last 10 years: No If all of the above answers are "NO", then may proceed with Cephalosporin use.    Shellfish Allergy Anaphylaxis and Shortness Of Breath   Almond (Diagnostic)  Soap Itching    ANYTHING WITH FRAGRANCE    Family History  Problem Relation Age of Onset   Arthritis Mother    Ovarian cancer Mother    Hyperlipidemia Mother    Hypertension Mother    Irritable bowel syndrome Mother    Arthritis Father    Hypertension Father    Asthma Father    Asthma Sister    Arthritis Paternal Grandmother     Social History   Socioeconomic History   Marital status: Divorced    Spouse name: Lonnie   Number of children: 0   Years of education: Not on file   Highest education level: Not on file  Occupational History   Occupation: CNA/ CPT  Tobacco Use    Smoking status: Never   Smokeless tobacco: Never  Vaping Use   Vaping Use: Never used  Substance and Sexual Activity   Alcohol use: Not Currently    Alcohol/week: 0.0 standard drinks of alcohol    Comment: occasionally   Drug use: No   Sexual activity: Yes    Birth control/protection: Pill  Other Topics Concern   Not on file  Social History Narrative   Not on file   Social Determinants of Health   Financial Resource Strain: Not on file  Food Insecurity: Not on file  Transportation Needs: Not on file  Physical Activity: Not on file  Stress: Not on file  Social Connections: Not on file  Intimate Partner Violence: Not on file     Constitutional: Patient reports headaches.  Denies fever, malaise, fatigue, or abrupt weight changes.  HEENT: Denies eye pain, eye redness, ear pain, ringing in the ears, wax buildup, runny nose, nasal congestion, bloody nose, or sore throat. Respiratory: Denies difficulty breathing, shortness of breath, cough or sputum production.   Cardiovascular: Denies chest pain, chest tightness, palpitations or swelling in the hands or feet.  Gastrointestinal: Denies abdominal pain, bloating, constipation, diarrhea or blood in the stool.  GU: Denies urgency, frequency, pain with urination, burning sensation, blood in urine, odor or discharge. Musculoskeletal: Denies decrease in range of motion, difficulty with gait, muscle pain or joint pain and swelling.  Skin: Denies redness, rashes, lesions or ulcercations.  Neurological: Denies dizziness, difficulty with memory, difficulty with speech or problems with balance and coordination.  Psych: Patient has a history of anxiety.  Denies depression, SI/HI.  No other specific complaints in a complete review of systems (except as listed in HPI above).  Objective:   Physical Exam  BP 134/82 (BP Location: Left Arm, Patient Position: Sitting, Cuff Size: Large)   Pulse (!) 103   Temp (!) 96.8 F (36 C) (Temporal)   Wt  222 lb (100.7 kg)   SpO2 99%   BMI 35.29 kg/m   Wt Readings from Last 3 Encounters:  02/14/23 218 lb (98.9 kg)  02/13/23 218 lb (98.9 kg)  02/08/23 224 lb (101.6 kg)    General: Appears her stated age, obese, in NAD. HEENT: Head: normal shape and size, no sinus tenderness noted; Eyes: sclera white, no icterus, conjunctiva pink, PERRLA and EOMs intact;  Neck:  Neck supple, trachea midline. No masses, lumps or thyromegaly present.  Cardiovascular: Tachycardic with normal rhythm. S1,S2 noted.  No murmur, rubs or gallops noted. No JVD or BLE edema. No carotid bruits noted. Pulmonary/Chest: Normal effort and positive vesicular breath sounds. No respiratory distress. No wheezes, rales or ronchi noted.  Abdomen: No CVA tenderness noted. Musculoskeletal:  No difficulty with gait.  Neurological: Alert and  oriented.  Coordination normal.     BMET    Component Value Date/Time   NA 136 02/15/2023 0609   NA 137 01/02/2014 1152   K 3.8 02/15/2023 0609   K 3.9 01/02/2014 1152   CL 105 02/15/2023 0609   CL 102 01/02/2014 1152   CO2 21 (L) 02/15/2023 0609   CO2 26 01/02/2014 1152   GLUCOSE 114 (H) 02/15/2023 0609   GLUCOSE 105 (H) 01/02/2014 1152   BUN 5 (L) 02/15/2023 0609   BUN 9 01/02/2014 1152   CREATININE 0.54 02/15/2023 0609   CREATININE 0.73 02/08/2023 1019   CALCIUM 8.5 (L) 02/15/2023 0609   CALCIUM 8.8 01/02/2014 1152   GFRNONAA >60 02/15/2023 0609   GFRNONAA >60 01/02/2014 1152   GFRAA >60 02/23/2019 1741   GFRAA >60 01/02/2014 1152    Lipid Panel     Component Value Date/Time   CHOL 158 02/08/2023 1019   TRIG 119 02/08/2023 1019   HDL 53 02/08/2023 1019   CHOLHDL 3.0 02/08/2023 1019   VLDL 15.0 09/18/2020 1108   LDLCALC 83 02/08/2023 1019    CBC    Component Value Date/Time   WBC 14.8 (H) 02/15/2023 0609   RBC 4.32 02/15/2023 0609   HGB 12.8 02/15/2023 0609   HGB 14.5 01/02/2014 1152   HCT 39.1 02/15/2023 0609   HCT 43.6 01/02/2014 1152   PLT 242  02/15/2023 0609   PLT 280 01/02/2014 1152   MCV 90.5 02/15/2023 0609   MCV 90 01/02/2014 1152   MCH 29.6 02/15/2023 0609   MCHC 32.7 02/15/2023 0609   RDW 12.7 02/15/2023 0609   RDW 12.7 01/02/2014 1152   LYMPHSABS 2.2 02/14/2023 1845   LYMPHSABS 2.4 01/02/2014 1152   MONOABS 1.1 (H) 02/14/2023 1845   MONOABS 0.7 01/02/2014 1152   EOSABS 0.0 02/14/2023 1845   EOSABS 0.4 01/02/2014 1152   BASOSABS 0.1 02/14/2023 1845   BASOSABS 0.1 01/02/2014 1152    Hgb A1C Lab Results  Component Value Date   HGBA1C 5.6 02/08/2023           Assessment & Plan:   ER Follow-up for Bacterial Sinusitis, UTI with Sepsis:  ER notes, labs and imaging reviewed We will repeat CBC to make sure that white count continues to trend down Urinalysis: moderate leuks Will send urine culture She is currently asymptomatic at this time-will continue to monitor  RTC in 5 months for follow-up of chronic conditions Nicki Reaper, NP

## 2023-02-21 NOTE — Patient Instructions (Signed)
Pregnancy and Urinary Tract Infection ? ?A urinary tract infection (UTI) is an infection of any part of the urinary tract. This includes the kidneys, the tubes that connect the kidneys to the bladder (ureters), the bladder, and the tube that carries urine out of the body (urethra). These organs make, store, and get rid of urine in the body. Your health care provider may use other names to describe the infection. An upper UTI affects the ureters and kidneys (pyelonephritis). A lower UTI affects the bladder (cystitis) and urethra (urethritis). ?Most UTIs are caused by bacteria in the genital area, around the entrance to the urinary tract. These bacteria grow and cause irritation and inflammation of the urinary tract. You are more likely to develop a UTI during pregnancy because: ?The physical and hormonal changes that your body goes through make it easier for bacteria to get into your urinary tract. ?Your growing baby puts pressure on your bladder and can affect urine flow. ?Pregnant women with diabetes are at an increased risk for developing a UTI. It is important to recognize and treat UTIs in pregnancy because they can cause serious complications for both you and your baby. ?How does this affect me? ?Symptoms of a UTI include: ?Needing to urinate right away (urgently) and often, even if urinating a small amount. ?Pain, burning, or having a hard time passing urine. ?Blood in the urine. ?Unusual, cloudy, and bad-smelling urine. ?Pain in the abdomen or lower back. ?Vaginal discharge. ?You may also have: ?Vomiting or a decreased appetite. ?Confusion. ?Irritability or tiredness. ?A fever. ?Diarrhea. ?A low level of red blood cells (anemia). ?The development of high blood pressure during pregnancy (preeclampsia). ?How does this affect my baby? ?An untreated UTI during pregnancy could lead to a kidney infection or an infection throughout the mother's body (systemic infection). This can cause health problems and affect the  baby. Possible complications of an untreated UTI include: ?Your baby being born before 37 weeks of pregnancy (premature). ?Your baby being born with a low birth weight. ?Your baby having a higher risk of having his or her skin or the white parts of the eyes turn yellow (jaundice). ?What can I do to lower my risk? ?To prevent a UTI: ?Do not hold urine for long periods of time. Empty your bladder as soon as you feel the urge. ?Always wipe from front to back, especially after a bowel movement. Use each tissue one time when you wipe. ?Empty your bladder after sex. ?Keep your genital area dry. ?Drink 6 to 8 glasses of water each day. ?Do not douche or use deodorant sprays. ?Wear cotton underwear and loose clothing. ?How is this treated? ?Treatment for this condition may include: ?Antibiotic medicines that are safe to take during pregnancy. ?Other medicines to treat less common causes of UTI. ?Follow these instructions at home: ?If you were prescribed an antibiotic medicine, take it as told by your health care provider. Do not stop using the antibiotic even if you start to feel better. ?Keep all follow-up visits. This is important. ?Contact a health care provider if: ?Your symptoms do not improve or they get worse. ?You have abnormal vaginal discharge. ?Get help right away if you: ?Have a fever. ?Have nausea and vomiting. ?Have back or side pain. ?Have lower belly pain, tightness, or feel contractions in your uterus. ?Have a gush of fluid from your vagina. ?Have blood in your urine. ?Summary ?A UTI is an infection of any part of the urinary tract, which includes the kidneys, ureters, bladder,   and urethra. Most urinary tract infections are caused by bacteria in your genital area, around the entrance to your urinary tract (urethra). You are more likely to develop a UTI during pregnancy. It is important to recognize and treat UTIs in pregnancy because of the risk of serious complications for both you and your baby. If you  were prescribed an antibiotic medicine, take it as told by your health care provider. Do not stop using the antibiotic even if you start to feel better. This information is not intended to replace advice given to you by your health care provider. Make sure you discuss any questions you have with your health care provider. Document Revised: 04/15/2021 Document Reviewed: 04/15/2021 Elsevier Patient Education  2024 [employer].

## 2023-02-22 LAB — URINE CULTURE
MICRO NUMBER:: 15063452
Result:: NO GROWTH
SPECIMEN QUALITY:: ADEQUATE

## 2023-05-08 ENCOUNTER — Other Ambulatory Visit: Payer: Self-pay

## 2023-05-08 ENCOUNTER — Emergency Department
Admission: EM | Admit: 2023-05-08 | Discharge: 2023-05-08 | Disposition: A | Discharge status: Home / Self Care | Payer: Managed Care, Other (non HMO) | Attending: Emergency Medicine | Admitting: Emergency Medicine

## 2023-05-08 ENCOUNTER — Encounter: Payer: Self-pay | Admitting: Emergency Medicine

## 2023-05-08 DIAGNOSIS — M545 Low back pain, unspecified: Secondary | ICD-10-CM | POA: Diagnosis present

## 2023-05-08 DIAGNOSIS — L03312 Cellulitis of back [any part except buttock]: Secondary | ICD-10-CM | POA: Insufficient documentation

## 2023-05-08 MED ORDER — TRAMADOL HCL 50 MG PO TABS: 50.0000 mg | ORAL_TABLET | Freq: Four times a day (QID) | ORAL | 0 refills | Status: DC | PRN

## 2023-05-08 MED ORDER — DOXYCYCLINE HYCLATE 100 MG PO TABS: 100.0000 mg | ORAL_TABLET | Freq: Two times a day (BID) | ORAL | 0 refills | Status: DC

## 2023-05-08 MED ORDER — DOXYCYCLINE HYCLATE 100 MG PO TABS
100.0000 mg | ORAL_TABLET | Freq: Once | ORAL | Status: AC
  Administered 2023-05-08: 100 mg via ORAL
  Filled 2023-05-08: qty 1

## 2023-05-08 NOTE — ED Triage Notes (Signed)
Patient to ED via POV for insect bite on back. Patient states more painful when pressure on it. As new tattoo in that spot approx 4 weeks ago.

## 2023-05-08 NOTE — ED Provider Notes (Signed)
   [facility] Provider Note    Event Date/Time   First MD Initiated Contact with Patient 05/08/23 1055     (approximate)   History   Insect Bite   HPI  Mikayla Briggs is a 42 y.o. female with possible insect bite to the lower back.  Patient reports over the last 2 to 3 days she has had increasing pain to the lower back over the site of the tattoo which she had 1 month ago.  No fevers reported     Physical Exam   Triage Vital Signs: ED Triage Vitals  Encounter Vitals Group     BP 05/08/23 1009 (!) 140/100     Systolic BP Percentile --      Diastolic BP Percentile --      Pulse Rate 05/08/23 1009 (!) 109     Resp 05/08/23 1009 18     Temp 05/08/23 1009 98.7 F (37.1 C)     Temp Source 05/08/23 1009 Oral     SpO2 05/08/23 1009 100 %     Weight 05/08/23 1010 99.8 kg (220 lb)     Height 05/08/23 1010 1.702 m (5\' 7" )     Head Circumference --      Peak Flow --      Pain Score 05/08/23 1009 10     Pain Loc --      Pain Education --      Exclude from Growth Chart --     Most recent vital signs: Vitals:   05/08/23 1009  BP: (!) 140/100  Pulse: (!) 109  Resp: 18  Temp: 98.7 F (37.1 C)  SpO2: 100%     General: Awake, no distress.  CV:  Good peripheral perfusion.  Resp:  Normal effort.  Abd:  No distention.  Other:  Back: Area of induration, redness consistent with cellulitis.  No fluctuation, no drainable fluid collection   ED Results / Procedures / Treatments   Labs (all labs ordered are listed, but only abnormal results are displayed) Labs Reviewed - No data to display   EKG     RADIOLOGY     PROCEDURES:  Critical Care performed:   Procedures   MEDICATIONS ORDERED IN ED: Medications  doxycycline (VIBRA-TABS) tablet 100 mg (has no administration in time range)     IMPRESSION / MDM / ASSESSMENT AND PLAN / ED COURSE  I reviewed the triage vital signs and the nursing notes. Patient's presentation  is most consistent with acute, uncomplicated illness.  Exam is most consistent with cellulitis, not consistent with abscess, will treat with antibiotics, analgesics, if worsening possibility of developing abscess return to the emergency department/PCP        FINAL CLINICAL IMPRESSION(S) / ED DIAGNOSES   Final diagnoses:  Cellulitis of back except buttock     Rx / DC Orders   ED Discharge Orders          Ordered    doxycycline (VIBRA-TABS) 100 MG tablet  2 times daily        05/08/23 1106    traMADol (ULTRAM) 50 MG tablet  Every 6 hours PRN        05/08/23 1106             Note:  This document was prepared using Dragon voice recognition software and may include unintentional dictation errors.   Jene Every, MD 05/08/23 [phone]

## 2023-08-09 ENCOUNTER — Other Ambulatory Visit: Payer: Self-pay | Admitting: Obstetrics and Gynecology

## 2023-08-09 ENCOUNTER — Ambulatory Visit
Admission: RE | Admit: 2023-08-09 | Discharge: 2023-08-09 | Disposition: A | Discharge status: Home / Self Care | Payer: 59 | Source: Ambulatory Visit | Attending: Obstetrics and Gynecology | Admitting: Obstetrics and Gynecology

## 2023-08-09 DIAGNOSIS — Z1231 Encounter for screening mammogram for malignant neoplasm of breast: Secondary | ICD-10-CM

## 2023-08-09 LAB — HM MAMMOGRAPHY

## 2023-08-15 ENCOUNTER — Ambulatory Visit: Payer: Managed Care, Other (non HMO) | Admitting: Internal Medicine

## 2023-08-17 ENCOUNTER — Encounter: Payer: Self-pay | Admitting: Internal Medicine

## 2023-08-17 ENCOUNTER — Ambulatory Visit: Payer: 59 | Admitting: Internal Medicine

## 2023-08-17 VITALS — BP 130/82 | Ht 67.0 in | Wt 239.0 lb

## 2023-08-17 DIAGNOSIS — Z23 Encounter for immunization: Secondary | ICD-10-CM

## 2023-08-17 DIAGNOSIS — Z6837 Body mass index (BMI) 37.0-37.9, adult: Secondary | ICD-10-CM

## 2023-08-17 DIAGNOSIS — Z9109 Other allergy status, other than to drugs and biological substances: Secondary | ICD-10-CM

## 2023-08-17 DIAGNOSIS — F418 Other specified anxiety disorders: Secondary | ICD-10-CM

## 2023-08-17 DIAGNOSIS — R519 Headache, unspecified: Secondary | ICD-10-CM

## 2023-08-17 DIAGNOSIS — E6609 Other obesity due to excess calories: Secondary | ICD-10-CM

## 2023-08-17 DIAGNOSIS — E66812 Obesity, class 2: Secondary | ICD-10-CM

## 2023-08-17 NOTE — Progress Notes (Signed)
Subjective:    Patient ID: Mikayla Briggs, female    DOB: June 13, 1981, 42 y.o.   MRN: [id number]  HPI  Patient presents to clinic today for follow-up of chronic conditions.  Migraines: These occur 1-2 times per month.  Triggered by stress, allergies.  She is taking amitriptyline as prescribed.  She takes fioricet as needed for breakthrough.  She does not follow with neurology.  Anxiety: Situational.  She is not currently taking any medications for this at this time.  She is not currently seeing a therapist.  She denies depression, SI/HI.  Environmental allergies: Chronic, managed on singulair, xyzal and zyrtec.  She does  not follow with an allergist.  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 90 tablet 1   Butalbital-APAP-Caffeine 50-300-40 MG CAPS Take 1 capsule by mouth every 4 (four) hours as needed.     cetirizine (ZYRTEC) 10 MG chewable tablet Chew 10 mg by mouth daily.     doxycycline (VIBRA-TABS) 100 MG tablet Take 1 tablet (100 mg total) by mouth 2 (two) times daily. 14 tablet 0   Drospirenone (SLYND) 4 MG TABS Take 4 mg by mouth at bedtime.     levocetirizine (XYZAL) 5 MG tablet Take 1 tablet by mouth daily in the evening 90 tablet 1   montelukast (SINGULAIR) 10 MG tablet Take 1 tablet (10 mg total) by mouth at bedtime. 90 tablet 1   phentermine (ADIPEX-P) 37.5 MG tablet Take 37.5 mg by mouth daily before breakfast.     traMADol (ULTRAM) 50 MG tablet Take 1 tablet (50 mg total) by mouth every 6 (six) hours as needed. 20 tablet 0   Vitamins/Minerals TABS Take by  mouth.     No current facility-administered medications for this visit.    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 Updated 12/30/22 after s/w pt.    Omeprazole Anaphylaxis and Shortness Of Breath   Penicillins Anaphylaxis and Shortness Of Breath    Has patient had a PCN reaction causing immediate rash, facial/tongue/throat swelling, SOB or lightheadedness with hypotension: Yes Has patient had a PCN reaction causing severe rash involving mucus membranes or skin necrosis: No Has patient had a PCN reaction that required hospitalization: Unknown Has patient had a PCN reaction occurring within the last 10 years: No If all of the above answers are "NO", then may proceed with Cephalosporin use.    Shellfish Allergy Anaphylaxis and Shortness Of Breath   Almond (Diagnostic)    Soap Itching    ANYTHING WITH FRAGRANCE    Family History  Problem Relation Age of Onset   Arthritis Mother    Ovarian cancer Mother    Hyperlipidemia Mother    Hypertension Mother    Irritable bowel syndrome Mother    Arthritis Father    Hypertension  Father    Asthma Father    Asthma Sister    Arthritis Paternal Grandmother     Social History   Socioeconomic History   Marital status: Divorced    Spouse name: Lonnie   Number of children: 0   Years of education: Not on file   Highest education level: Not on file  Occupational History   Occupation: CNA/ CPT  Tobacco Use   Smoking status: Never   Smokeless tobacco: Never  Vaping Use   Vaping status: Never Used  Substance and Sexual Activity   Alcohol use: Not Currently    Alcohol/week: 0.0 standard drinks of alcohol    Comment: occasionally   Drug use: No   Sexual activity: Yes    Birth control/protection: Pill  Other Topics Concern   Not on file  Social  History Narrative   Not on file   Social Determinants of Health   Financial Resource Strain: Not on file  Food Insecurity: Not on file  Transportation Needs: Not on file  Physical Activity: Not on file  Stress: Not on file  Social Connections: Not on file  Intimate Partner Violence: Not on file     Constitutional: Patient reports intermittent headaches.  Denies fever, malaise, fatigue, or abrupt weight changes.  HEENT: Denies eye pain, eye redness, ear pain, ringing in the ears, wax buildup, runny nose, nasal congestion, bloody nose, or sore throat. Respiratory: Denies difficulty breathing, shortness of breath, cough or sputum production.   Cardiovascular: Denies chest pain, chest tightness, palpitations or swelling in the hands or feet.  Gastrointestinal: Denies abdominal pain, bloating, constipation, diarrhea or blood in the stool.  GU: Denies urgency, frequency, pain with urination, burning sensation, blood in urine, odor or discharge. Musculoskeletal: Denies decrease in range of motion, difficulty with gait, or joint swelling.  Skin: Denies redness, rashes, lesions or ulcercations.  Neurological: Denies dizziness, difficulty with memory, difficulty with speech or problems with balance and coordination.  Psych: Patient has a history of anxiety.  Denies depression, SI/HI.  No other specific complaints in a complete review of systems (except as listed in HPI above).  Objective:   Physical Exam  BP 130/82   Ht 5\' 7"  (1.702 m)   Wt 239 lb (108.4 kg)   BMI 37.43 kg/m    Wt Readings from Last 3 Encounters:  05/08/23 220 lb (99.8 kg)  02/21/23 222 lb (100.7 kg)  02/14/23 218 lb (98.9 kg)    General: Appears her stated age, obese, in NAD. Skin: Warm, dry and intact.  HEENT: Head: normal shape and size; Eyes: sclera white, no icterus, conjunctiva pink, PERRLA and EOMs intact;  Cardiovascular: Normal rate and rhythm. S1,S2 noted.  No murmur, rubs or gallops noted.   Pulmonary/Chest: Normal effort and positive vesicular breath sounds. No respiratory distress. No wheezes, rales or ronchi noted.  Musculoskeletal: No difficulty with gait.  Neurological: Alert and oriented.  Psychiatric: Mood and affect normal. Behavior is normal. Judgment and thought content normal.   BMET    Component Value Date/Time   NA 136 02/15/2023 0609   NA 137 01/02/2014 1152   K 3.8 02/15/2023 0609   K 3.9 01/02/2014 1152   CL 105 02/15/2023 0609   CL 102 01/02/2014 1152   CO2 21 (L) 02/15/2023 0609   CO2 26 01/02/2014 1152   GLUCOSE 114 (H) 02/15/2023 0609   GLUCOSE 105 (H) 01/02/2014 1152   BUN 5 (L) 02/15/2023 0609   BUN 9 01/02/2014 1152   CREATININE  0.54 02/15/2023 0609   CREATININE 0.73 02/08/2023 1019   CALCIUM 8.5 (L) 02/15/2023 0609   CALCIUM 8.8 01/02/2014 1152   GFRNONAA >60 02/15/2023 0609   GFRNONAA >60 01/02/2014 1152   GFRAA >60 02/23/2019 1741   GFRAA >60 01/02/2014 1152    Lipid Panel     Component Value Date/Time   CHOL 158 02/08/2023 1019   TRIG 119 02/08/2023 1019   HDL 53 02/08/2023 1019   CHOLHDL 3.0 02/08/2023 1019   VLDL 15.0 09/18/2020 1108   LDLCALC 83 02/08/2023 1019    CBC    Component Value Date/Time   WBC 12.8 (H) 02/21/2023 1016   RBC 4.56 02/21/2023 1016   HGB 13.4 02/21/2023 1016   HGB 14.5 01/02/2014 1152   HCT 40.9 02/21/2023 1016   HCT 43.6 01/02/2014 1152   PLT 355 02/21/2023 1016   PLT 280 01/02/2014 1152   MCV 89.7 02/21/2023 1016   MCV 90 01/02/2014 1152   MCH 29.4 02/21/2023 1016   MCHC 32.8 02/21/2023 1016   RDW 12.4 02/21/2023 1016   RDW 12.7 01/02/2014 1152   LYMPHSABS 2.2 02/14/2023 1845   LYMPHSABS 2.4 01/02/2014 1152   MONOABS 1.1 (H) 02/14/2023 1845   MONOABS 0.7 01/02/2014 1152   EOSABS 0.0 02/14/2023 1845   EOSABS 0.4 01/02/2014 1152   BASOSABS 0.1 02/14/2023 1845   BASOSABS 0.1 01/02/2014 1152    Hgb A1C Lab Results  Component Value Date   HGBA1C 5.6 02/08/2023            Assessment & Plan:     RTC in 6 months for your annual exam Nicki Reaper, NP

## 2023-08-17 NOTE — Assessment & Plan Note (Signed)
Encouraged diet and exercise for weight loss ?

## 2023-08-17 NOTE — Patient Instructions (Signed)
Managing Stress, Adult Feeling a certain amount of stress is normal. Stress helps our body and mind get ready to deal with the demands of life. Stress hormones can motivate you to do well at work and meet your responsibilities. But severe or long-term (chronic) stress can affect your mental and physical health. Chronic stress puts you at higher risk for: Anxiety and depression. Other health problems such as digestive problems, muscle aches, heart disease, high blood pressure, and stroke. What are the causes? Common causes of stress include: Demands from work, such as deadlines, feeling overworked, or having long hours. Pressures at home, such as money issues, disagreements with a spouse, or parenting issues. Pressures from major life changes, such as divorce, moving, loss of a loved one, or chronic illness. You may be at higher risk for stress-related problems if you: Do not get enough sleep. Are in poor health. Do not have emotional support. Have a mental health disorder such as anxiety or depression. How to recognize stress Stress can make you: Have trouble sleeping. Feel sad, anxious, irritable, or overwhelmed. Lose your appetite. Overeat or want to eat unhealthy foods. Want to use drugs or alcohol. Stress can also cause physical symptoms, such as: Sore, tense muscles, especially in the shoulders and neck. Headaches. Trouble breathing. A faster heart rate. Stomach pain, nausea, or vomiting. Diarrhea or constipation. Trouble concentrating. Follow these instructions at home: Eating and drinking Eat a healthy diet. This includes: Eating foods that are high in fiber, such as beans, whole grains, and fresh fruits and vegetables. Limiting foods that are high in fat and processed sugars, such as fried or sweet foods. Do not skip meals or overeat. Drink enough fluid to keep your urine pale yellow. Alcohol use Do not drink alcohol if: Your health care provider tells you not to  drink. You are pregnant, may be pregnant, or are planning to become pregnant. Drinking alcohol is a way some people try to ease their stress. This can be dangerous, so if you drink alcohol: Limit how much you have to: 0-1 drink a day for women. 0-2 drinks a day for men. Know how much alcohol is in your drink. In the [place] one drink equals one 12 oz bottle of beer (355 mL), one 5 oz glass of wine (148 mL), or one 1 oz glass of hard liquor (44 mL). Activity  Include 30 minutes of exercise in your daily schedule. Exercise is a good stress reducer. Include time in your day for an activity that you find relaxing. Try taking a walk, going on a bike ride, reading a book, or listening to music. Schedule your time in a way that lowers stress, and keep a regular schedule. Focus on doing what is most important to get done. Lifestyle Identify the source of your stress and your reaction to it. See a therapist who can help you change unhelpful reactions. When there are stressful events: Talk about them with family, friends, or coworkers. Try to think realistically about stressful events and not ignore them or overreact. Try to find the positives in a stressful situation and not focus on the negatives. Cut back on responsibilities at work and home, if possible. Ask for help from friends or family members if you need it. Find ways to manage stress, such as: Mindfulness, meditation, or deep breathing. Yoga or tai chi. Progressive muscle relaxation. Spending time in nature. Doing art, playing music, or reading. Making time for fun activities. Spending time with family and friends. Get support   from family, friends, or spiritual resources. General instructions Get enough sleep. Try to go to sleep and get up at about the same time every day. Take over-the-counter and prescription medicines only as told by your health care provider. Do not use any products that contain nicotine or tobacco. These products  include cigarettes, chewing tobacco, and vaping devices, such as e-cigarettes. If you need help quitting, ask your health care provider. Do not use drugs or smoke to deal with stress. Keep all follow-up visits. This is important. Where to find support Talk with your health care provider about stress management or finding a support group. Find a therapist to work with you on your stress management techniques. Where to find more information National Alliance on Mental Illness: www.nami.org American Psychological Association: www.apa.org Contact a health care provider if: Your stress symptoms get worse. You are unable to manage your stress at home. You are struggling to stop using drugs or alcohol. Get help right away if: You may be a danger to yourself or others. You have any thoughts of death or suicide. Get help right awayif you feel like you may hurt yourself or others, or have thoughts about taking your own life. Go to your nearest emergency room or: Call 911. Call the National Suicide Prevention Lifeline at 1-800-273-8255 or 988 in the [place] This is open 24 hours a day. Text the Crisis Text Line at 741741. Summary Feeling a certain amount of stress is normal, but severe or long-term (chronic) stress can affect your mental and physical health. Chronic stress can put you at higher risk for anxiety, depression, and other health problems such as digestive problems, muscle aches, heart disease, high blood pressure, and stroke. You may be at higher risk for stress-related problems if you do not get enough sleep, are in poor health, lack emotional support, or have a mental health disorder such as anxiety or depression. Identify the source of your stress and your reaction to it. Try talking about stressful events with family, friends, or coworkers, finding a coping method, or getting support from spiritual resources. If you need more help, talk with your health care provider about finding a  support group or a mental health therapist. This information is not intended to replace advice given to you by your health care provider. Make sure you discuss any questions you have with your health care provider. Document Revised: 03/26/2021 Document Reviewed: 03/24/2021 Elsevier Patient Education  2024 [employer]  

## 2023-08-17 NOTE — Assessment & Plan Note (Addendum)
Continue singulair, xyzal and zyrtec

## 2023-08-17 NOTE — Assessment & Plan Note (Signed)
Not medicated Support offered 

## 2023-08-17 NOTE — Assessment & Plan Note (Signed)
Avoid triggers Continue amitriptyline and fioricet

## 2023-08-18 ENCOUNTER — Encounter: Payer: Self-pay | Admitting: Internal Medicine

## 2023-09-09 ENCOUNTER — Other Ambulatory Visit: Payer: Self-pay

## 2023-09-09 DIAGNOSIS — Z9109 Other allergy status, other than to drugs and biological substances: Secondary | ICD-10-CM

## 2023-09-09 MED ORDER — LEVOCETIRIZINE DIHYDROCHLORIDE 5 MG PO TABS
ORAL_TABLET | ORAL | 1 refills | Status: DC
Start: 2023-09-09 — End: 2024-02-23

## 2023-09-09 MED ORDER — MONTELUKAST SODIUM 10 MG PO TABS
10.0000 mg | ORAL_TABLET | Freq: Every day | ORAL | 1 refills | Status: DC
Start: 2023-09-09 — End: 2024-02-23

## 2023-09-09 MED ORDER — AMITRIPTYLINE HCL 25 MG PO TABS: 25.0000 mg | ORAL_TABLET | Freq: Every day | ORAL | 1 refills | Status: DC

## 2024-02-09 ENCOUNTER — Ambulatory Visit: Payer: Self-pay | Admitting: Internal Medicine

## 2024-02-10 ENCOUNTER — Ambulatory Visit: Admitting: Internal Medicine

## 2024-02-15 ENCOUNTER — Ambulatory Visit (INDEPENDENT_AMBULATORY_CARE_PROVIDER_SITE_OTHER): Admitting: Internal Medicine

## 2024-02-15 ENCOUNTER — Encounter: Payer: Self-pay | Admitting: Internal Medicine

## 2024-02-15 VITALS — BP 128/82 | Ht 67.0 in | Wt 239.4 lb

## 2024-02-15 DIAGNOSIS — E6609 Other obesity due to excess calories: Secondary | ICD-10-CM

## 2024-02-15 DIAGNOSIS — Z136 Encounter for screening for cardiovascular disorders: Secondary | ICD-10-CM

## 2024-02-15 DIAGNOSIS — Z0001 Encounter for general adult medical examination with abnormal findings: Secondary | ICD-10-CM

## 2024-02-15 DIAGNOSIS — Z6837 Body mass index (BMI) 37.0-37.9, adult: Secondary | ICD-10-CM

## 2024-02-15 DIAGNOSIS — R739 Hyperglycemia, unspecified: Secondary | ICD-10-CM

## 2024-02-15 DIAGNOSIS — E66812 Obesity, class 2: Secondary | ICD-10-CM

## 2024-02-15 MED ORDER — PREDNISONE 10 MG PO TABS: ORAL_TABLET | ORAL | 0 refills | Status: DC

## 2024-02-15 NOTE — Patient Instructions (Signed)

## 2024-02-15 NOTE — Progress Notes (Signed)
 Subjective:    Patient ID: Mikayla Briggs, female    DOB: 07-Oct-1980, 43 y.o.   MRN: [id number]  HPI  Patient presents to clinic today for her annual exam.  Flu: 08/2023 Tetanus: 12/2018 COVID: Never Pap smear: 03/2020 Mammogram: 08/2023 Vision screening: annually Dentist: biannually  Diet: She does eat meat. She consumes fruits and veggies. She tries to avoid fried foods. She drinks mostly water, soda. 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 90 tablet 1   Butalbital -APAP-Caffeine  50-300-40 MG CAPS Take 1 capsule by mouth every 4 (four) hours as needed.     cetirizine  (ZYRTEC ) 10 MG chewable tablet Chew 10 mg by mouth daily.     Drospirenone  (SLYND ) 4 MG TABS Take 4 mg by mouth at bedtime.     levocetirizine (XYZAL ) 5 MG tablet Take 1 tablet by mouth daily in the evening 90 tablet 1   montelukast  (SINGULAIR ) 10 MG tablet Take 1 tablet (10 mg total) by mouth at bedtime. 90 tablet 1   phentermine  (ADIPEX-P ) 37.5 MG tablet Take 37.5 mg by mouth daily before breakfast.     Vitamins/Minerals TABS Take by mouth.     No current facility-administered medications for this visit.    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 Updated 12/30/22 after s/w pt.    Omeprazole  Anaphylaxis and Shortness Of Breath   Penicillins Anaphylaxis and Shortness Of Breath    Has  patient had a PCN reaction causing immediate rash, facial/tongue/throat swelling, SOB or lightheadedness with hypotension: Yes Has patient had a PCN reaction causing severe rash involving mucus membranes or skin necrosis: No Has patient had a PCN reaction that required hospitalization: Unknown Has patient had a PCN reaction occurring within the last 10 years: No If all of the above answers are "NO", then may proceed with Cephalosporin use.    Shellfish Allergy Anaphylaxis and Shortness Of Breath   Almond (Diagnostic)    Soap Itching    ANYTHING WITH FRAGRANCE    Family History  Problem Relation Age of Onset   Arthritis Mother    Ovarian cancer Mother    Hyperlipidemia Mother    Hypertension Mother    Irritable bowel syndrome Mother    Arthritis Father    Hypertension Father    Asthma Father    Asthma Sister    Arthritis Paternal Grandmother     Social History   Socioeconomic History   Marital status: Divorced    Spouse name: Lonnie   Number of children: 0   Years of education: Not on file   Highest education level: Not on file  Occupational History   Occupation: CNA/ CPT  Tobacco Use   Smoking status: Never   Smokeless tobacco: Never  Vaping Use   Vaping status: Never  Used  Substance and Sexual Activity   Alcohol use: Not Currently    Alcohol/week: 0.0 standard drinks of alcohol    Comment: occasionally   Drug use: No   Sexual activity: Yes    Birth control/protection: Pill  Other Topics Concern   Not on file  Social History Narrative   Not on file   Social Drivers of Health   Financial Resource Strain: Low Risk  (08/17/2023)   Overall Financial Resource Strain (CARDIA)    Difficulty of Paying Living Expenses: Not hard at all  Food Insecurity: Food Insecurity Present (08/17/2023)   Hunger Vital Sign    Worried About Running Out of Food in the Last Year: Sometimes true    Ran Out of Food in the Last Year: Never true  Transportation Needs: No Transportation  Needs (08/17/2023)   PRAPARE - Administrator, Civil Service (Medical): No    Lack of Transportation (Non-Medical): No  Physical Activity: Unknown (08/17/2023)   Exercise Vital Sign    Days of Exercise per Week: 4 days    Minutes of Exercise per Session: Not on file  Stress: Patient Declined (08/17/2023)   [employer] of Occupational Health - Occupational Stress Questionnaire    Feeling of Stress : Patient declined  Social Connections: Unknown (08/17/2023)   Social Connection and Isolation Panel [NHANES]    Frequency of Communication with Friends and Family: More than three times a week    Frequency of Social Gatherings with Friends and Family: Not on file    Attends Religious Services: 1 to 4 times per year    Active Member of [employer] or Organizations: Not on file    Attends Banker Meetings: Not on file    Marital Status: Not on file  Intimate Partner Violence: Not At Risk (08/17/2023)   Humiliation, Afraid, Rape, and Kick questionnaire    Fear of Current or Ex-Partner: No    Emotionally Abused: No    Physically Abused: No    Sexually Abused: No     Constitutional: Patient reports intermittent headaches.  Denies fever, malaise, fatigue, or abrupt weight changes.  HEENT: Denies eye pain, eye redness, ear pain, ringing in the ears, wax buildup, runny nose, nasal congestion, bloody nose, or sore throat. Respiratory: Denies difficulty breathing, shortness of breath, cough or sputum production.   Cardiovascular: Denies chest pain, chest tightness, palpitations or swelling in the hands or feet.  Gastrointestinal: Denies abdominal pain, bloating, constipation, diarrhea or blood in the stool.  GU: Denies urgency, frequency, pain with urination, burning sensation, blood in urine, odor or discharge. Musculoskeletal: Denies decrease in range of motion, difficulty with gait, muscle pain or joint pain and swelling.  Skin: Pt reports rash to RLE. Denies redness, lesions  or ulcercations.  Neurological: Denies dizziness, difficulty with memory, difficulty with speech or problems with balance and coordination.  Psych: Patient has a history of anxiety.  Denies depression, SI/HI.  No other specific complaints in a complete review of systems (except as listed in HPI above).  Objective:   Physical Exam  BP 128/82 (BP Location: Left Arm, Patient Position: Sitting, Cuff Size: Large)   Ht 5\' 7"  (1.702 m)   Wt 239 lb 6.4 oz (108.6 kg)   LMP 01/17/2024 (Approximate)   BMI 37.50 kg/m    Wt Readings from Last 3 Encounters:  08/17/23 239 lb (108.4 kg)  05/08/23 220 lb (99.8 kg)  02/21/23 222 lb (100.7 kg)    General: Appears her stated  age, obese, in NAD. Skin: Warm, dry and intact. Scaly rash noted to right anterior lower leg. HEENT: Head: normal shape and size; Eyes: sclera white, no icterus, conjunctiva pink, PERRLA and EOMs intact; Neck:  Neck supple, trachea midline. No masses, lumps or thyromegaly present.  Cardiovascular: Normal rate and rhythm. S1,S2 noted.  No murmur, rubs or gallops noted. No JVD or BLE edema. Pulmonary/Chest: Normal effort and positive vesicular breath sounds. No respiratory distress. No wheezes, rales or ronchi noted.  Abdomen:  Normal bowel sounds.  Musculoskeletal: Strength 5/5 BUE/BLE. No difficulty with gait.  Neurological: Alert and oriented. Cranial nerves II-XII grossly intact. Coordination normal.  Psychiatric: Mood and affect normal. Behavior is normal. Judgment and thought content normal.    BMET    Component Value Date/Time   NA 136 02/15/2023 0609   NA 137 01/02/2014 1152   K 3.8 02/15/2023 0609   K 3.9 01/02/2014 1152   CL 105 02/15/2023 0609   CL 102 01/02/2014 1152   CO2 21 (L) 02/15/2023 0609   CO2 26 01/02/2014 1152   GLUCOSE 114 (H) 02/15/2023 0609   GLUCOSE 105 (H) 01/02/2014 1152   BUN 5 (L) 02/15/2023 0609   BUN 9 01/02/2014 1152   CREATININE 0.54 02/15/2023 0609   CREATININE 0.73 02/08/2023 1019    CALCIUM 8.5 (L) 02/15/2023 0609   CALCIUM 8.8 01/02/2014 1152   GFRNONAA >60 02/15/2023 0609   GFRNONAA >60 01/02/2014 1152   GFRAA >60 02/23/2019 1741   GFRAA >60 01/02/2014 1152    Lipid Panel     Component Value Date/Time   CHOL 158 02/08/2023 1019   TRIG 119 02/08/2023 1019   HDL 53 02/08/2023 1019   CHOLHDL 3.0 02/08/2023 1019   VLDL 15.0 09/18/2020 1108   LDLCALC 83 02/08/2023 1019    CBC    Component Value Date/Time   WBC 12.8 (H) 02/21/2023 1016   RBC 4.56 02/21/2023 1016   HGB 13.4 02/21/2023 1016   HGB 14.5 01/02/2014 1152   HCT 40.9 02/21/2023 1016   HCT 43.6 01/02/2014 1152   PLT 355 02/21/2023 1016   PLT 280 01/02/2014 1152   MCV 89.7 02/21/2023 1016   MCV 90 01/02/2014 1152   MCH 29.4 02/21/2023 1016   MCHC 32.8 02/21/2023 1016   RDW 12.4 02/21/2023 1016   RDW 12.7 01/02/2014 1152   LYMPHSABS 2.2 02/14/2023 1845   LYMPHSABS 2.4 01/02/2014 1152   MONOABS 1.1 (H) 02/14/2023 1845   MONOABS 0.7 01/02/2014 1152   EOSABS 0.0 02/14/2023 1845   EOSABS 0.4 01/02/2014 1152   BASOSABS 0.1 02/14/2023 1845   BASOSABS 0.1 01/02/2014 1152    Hgb A1C Lab Results  Component Value Date   HGBA1C 5.6 02/08/2023           Assessment & Plan:   Preventative Health Maintenance:  Encouraged her to get a flu shot in fall Tetanus UTD Encouraged her to get her COVID-vaccine Pap smear UTD Mammogram ordered, she will call to schedule Encouraged her to consume a balanced diet and exercise regimen Advised her seeing eye doctor and dentist annually We will check CBC, c-Met, lipid, A1c today  Atopic dermatitis  RX for pred taper x 6 days  RTC in 6 months for follow-up of chronic conditions Helayne Lo, NP

## 2024-02-15 NOTE — Assessment & Plan Note (Signed)
 Encouraged diet and exercise for weight loss ?

## 2024-02-16 ENCOUNTER — Ambulatory Visit: Payer: Self-pay | Admitting: Internal Medicine

## 2024-02-16 LAB — CBC
HCT: 40 % (ref 35.0–45.0)
Hemoglobin: 13 g/dL (ref 11.7–15.5)
MCH: 29.1 pg (ref 27.0–33.0)
MCHC: 32.5 g/dL (ref 32.0–36.0)
MCV: 89.7 fL (ref 80.0–100.0)
MPV: 10.4 fL (ref 7.5–12.5)
Platelets: 322 10*3/uL (ref 140–400)
RBC: 4.46 10*6/uL (ref 3.80–5.10)
RDW: 12.9 % (ref 11.0–15.0)
WBC: 9.1 10*3/uL (ref 3.8–10.8)

## 2024-02-16 LAB — COMPREHENSIVE METABOLIC PANEL WITH GFR
AG Ratio: 1.3 (calc) (ref 1.0–2.5)
ALT: 14 U/L (ref 6–29)
AST: 20 U/L (ref 10–30)
Albumin: 4 g/dL (ref 3.6–5.1)
Alkaline phosphatase (APISO): 53 U/L (ref 31–125)
BUN/Creatinine Ratio: 9 (calc) (ref 6–22)
BUN: 6 mg/dL — ABNORMAL LOW (ref 7–25)
CO2: 27 mmol/L (ref 20–32)
Calcium: 9.2 mg/dL (ref 8.6–10.2)
Chloride: 105 mmol/L (ref 98–110)
Creat: 0.65 mg/dL (ref 0.50–0.99)
Globulin: 3.1 g/dL (ref 1.9–3.7)
Glucose, Bld: 91 mg/dL (ref 65–99)
Potassium: 4.3 mmol/L (ref 3.5–5.3)
Sodium: 139 mmol/L (ref 135–146)
Total Bilirubin: 0.7 mg/dL (ref 0.2–1.2)
Total Protein: 7.1 g/dL (ref 6.1–8.1)
eGFR: 113 mL/min/{1.73_m2} (ref >=60)

## 2024-02-16 LAB — LIPID PANEL
Cholesterol: 154 mg/dL (ref <200)
HDL: 41 mg/dL — ABNORMAL LOW (ref >=50)
LDL Cholesterol (Calc): 87 mg/dL
Non-HDL Cholesterol (Calc): 113 mg/dL (ref <130)
Total CHOL/HDL Ratio: 3.8 (calc) (ref <5.0)
Triglycerides: 159 mg/dL — ABNORMAL HIGH (ref <150)

## 2024-02-16 LAB — HEMOGLOBIN A1C
Hgb A1c MFr Bld: 5.5 % (ref <5.7)
Mean Plasma Glucose: 111 mg/dL
eAG (mmol/L): 6.2 mmol/L

## 2024-02-21 ENCOUNTER — Other Ambulatory Visit: Payer: Self-pay

## 2024-02-21 DIAGNOSIS — Z9109 Other allergy status, other than to drugs and biological substances: Secondary | ICD-10-CM

## 2024-02-23 ENCOUNTER — Other Ambulatory Visit: Payer: Self-pay

## 2024-02-23 DIAGNOSIS — Z9109 Other allergy status, other than to drugs and biological substances: Secondary | ICD-10-CM

## 2024-02-23 MED ORDER — LEVOCETIRIZINE DIHYDROCHLORIDE 5 MG PO TABS: ORAL_TABLET | ORAL | 1 refills | Status: DC

## 2024-02-23 MED ORDER — AMITRIPTYLINE HCL 25 MG PO TABS: 25.0000 mg | ORAL_TABLET | Freq: Every day | ORAL | 1 refills | Status: DC

## 2024-02-23 MED ORDER — MONTELUKAST SODIUM 10 MG PO TABS: 10.0000 mg | ORAL_TABLET | Freq: Every day | ORAL | 1 refills | Status: DC

## 2024-04-15 ENCOUNTER — Encounter: Payer: Self-pay | Admitting: Internal Medicine

## 2024-04-16 ENCOUNTER — Telehealth: Admitting: Internal Medicine

## 2024-04-16 DIAGNOSIS — G43019 Migraine without aura, intractable, without status migrainosus: Secondary | ICD-10-CM | POA: Diagnosis not present

## 2024-04-16 NOTE — Progress Notes (Signed)
 Virtual Visit via Video Note  I connected with Mikayla Briggs on 04/16/24 at  4:00 PM EDT by a video enabled telemedicine application and verified that I am speaking with the correct person using two identifiers.  Location: Patient: Home Provider: Office  Person's participating in this video call: Angeline Laura, NP-C and Mikayla Briggs   I discussed the limitations of evaluation and management by telemedicine and the availability of in person appointments. The patient expressed understanding and agreed to proceed.  History of Present Illness:  Discussed the use of AI scribe software for clinical note transcription with the patient, who gave verbal consent to proceed.  History of Present Illness  Mikayla Briggs, a 43 year old female with a history of migraines who presents with a recent severe migraine episode.  She experienced a severe migraine starting Saturday night after returning home from work, which persisted into Sunday. The episode was characterized by photophobia and phonophobia, and was severe enough to cause her to miss work on Sunday. She attempted to manage the migraine with Fioricet , which provided some relief, but the symptoms were still significant enough to prevent her from working. This was the first severe migraine episode in over a year.  She also experienced nausea and vomiting throughout Saturday night and into Sunday morning. She attributes the onset of this migraine to recent stress at work, noting that her supervisor had been absent for two weeks, increasing her workload.  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 90 tablet 1   Butalbital -APAP-Caffeine  50-300-40 MG CAPS Take 1 capsule by mouth every 4 (four) hours as needed.     cetirizine  (ZYRTEC ) 10 MG chewable tablet Chew 10 mg by mouth daily.     Drospirenone  (SLYND ) 4 MG TABS Take 4 mg by mouth at bedtime.     levocetirizine (XYZAL ) 5 MG tablet Take 1 tablet by mouth daily in the evening 90 tablet 1   montelukast  (SINGULAIR ) 10 MG tablet Take 1 tablet (10 mg total) by mouth at bedtime. 90 tablet 1   phentermine  (ADIPEX-P ) 37.5 MG tablet Take 37.5 mg by mouth daily before breakfast.     predniSONE  (DELTASONE ) 10 MG tablet Take 6 tabs on day 1, 5 tabs on day 2, 4 tabs on day 3, 3 tabs on day 4, 2 tabs on day 5, 1 tab on day 6 21 tablet 0   Vitamins/Minerals TABS Take by mouth.     No current facility-administered medications for this visit.    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 Updated 12/30/22 after s/w pt.    Omeprazole  Anaphylaxis and Shortness Of Breath   Penicillins Anaphylaxis and Shortness Of Breath    Has patient had a PCN reaction causing immediate rash, facial/tongue/throat swelling,  SOB or lightheadedness with hypotension: Yes Has patient had a PCN reaction causing severe rash involving mucus membranes or skin necrosis: No Has patient had a PCN reaction that required hospitalization: Unknown Has patient had a PCN reaction occurring within the last 10 years: No If all of the above answers are NO, then may proceed with Cephalosporin use.    Shellfish Allergy Anaphylaxis and Shortness Of Breath   Almond (Diagnostic)    Soap Itching    ANYTHING WITH FRAGRANCE    Family History  Problem Relation Age of Onset   Arthritis Mother     Ovarian cancer Mother    Hyperlipidemia Mother    Hypertension Mother    Irritable bowel syndrome Mother    Arthritis Father    Hypertension Father    Asthma Father    Asthma Sister    Arthritis Paternal Grandmother     Social History   Socioeconomic History   Marital status: Divorced    Spouse name: Lonnie   Number of children: 0   Years of education: Not on file   Highest education level: Some college, no degree  Occupational History   Occupation: CNA/ CPT  Tobacco Use   Smoking status: Never   Smokeless tobacco: Never  Vaping Use   Vaping status: Never Used  Substance and Sexual Activity   Alcohol use: Not Currently    Alcohol/week: 0.0 standard drinks of alcohol    Comment: occasionally   Drug use: No   Sexual activity: Yes    Birth control/protection: Pill  Other Topics Concern   Not on file  Social History Narrative   Not on file   Social Drivers of Health   Financial Resource Strain: Low Risk  (04/16/2024)   Overall Financial Resource Strain (CARDIA)    Difficulty of Paying Living Expenses: Not hard at all  Food Insecurity: Unknown (04/16/2024)   Hunger Vital Sign    Worried About Running Out of Food in the Last Year: Never true    Ran Out of Food in the Last Year: Patient declined  Transportation Needs: No Transportation Needs (04/16/2024)   PRAPARE - Administrator, Civil Service (Medical): No    Lack of Transportation (Non-Medical): No  Physical Activity: Unknown (04/16/2024)   Exercise Vital Sign    Days of Exercise per Week: 5 days    Minutes of Exercise per Session: Patient declined  Stress: No Stress Concern Present (04/16/2024)   [employer] of Occupational Health - Occupational Stress Questionnaire    Feeling of Stress: Only a little  Social Connections: Unknown (04/16/2024)   Social Connection and Isolation Panel    Frequency of Communication with Friends and Family: More than three times a week    Frequency of Social Gatherings with  Friends and Family: Patient declined    Attends Religious Services: Patient declined    Database administrator or Organizations: Patient declined    Attends Banker Meetings: Not on file    Marital Status: Patient declined  Intimate Partner Violence: Not At Risk (08/17/2023)   Humiliation, Afraid, Rape, and Kick questionnaire    Fear of Current or Ex-Partner: No    Emotionally Abused: No    Physically Abused: No    Sexually Abused: No     Constitutional: Pt reports headache. Denies fever, malaise, fatigue, or abrupt weight changes.  HEENT: Denies eye pain, eye redness, ear pain, ringing in the ears, wax buildup, runny nose, nasal congestion, bloody nose, or sore  throat. Respiratory: Denies difficulty breathing, shortness of breath, cough or sputum production.   Cardiovascular: Denies chest pain, chest tightness, palpitations or swelling in the hands or feet.  Gastrointestinal: Pt reports nausea and vomiting. Denies abdominal pain, bloating, constipation, diarrhea or blood in the stool.  GU: Denies urgency, frequency, pain with urination, burning sensation, blood in urine, odor or discharge. Musculoskeletal: Denies decrease in range of motion, difficulty with gait, muscle pain or joint pain and swelling.  Skin: Denies redness, rashes, lesions or ulcercations.  Neurological: Pt reports sensitivity to light and sound. Denies dizziness, difficulty with memory, difficulty with speech or problems with balance and coordination.  Psych: Denies anxiety, depression, SI/HI.  No other specific complaints in a complete review of systems (except as listed in HPI above).  Observations/Objective:   Wt Readings from Last 3 Encounters:  02/15/24 239 lb 6.4 oz (108.6 kg)  08/17/23 239 lb (108.4 kg)  05/08/23 220 lb (99.8 kg)    General: Appears her stated age, obese, in NAD. HEENT: Head: normal shape and size; Eyes: sclera white, no icterus, conjunctiva pink, PERRLA and EOMs intact;   Pulmonary/Chest: Normal effort. No respiratory distress.  Neurological: Alert and oriented. Coordination normal.    BMET    Component Value Date/Time   NA 139 02/15/2024 1035   NA 137 01/02/2014 1152   K 4.3 02/15/2024 1035   K 3.9 01/02/2014 1152   CL 105 02/15/2024 1035   CL 102 01/02/2014 1152   CO2 27 02/15/2024 1035   CO2 26 01/02/2014 1152   GLUCOSE 91 02/15/2024 1035   GLUCOSE 105 (H) 01/02/2014 1152   BUN 6 (L) 02/15/2024 1035   BUN 9 01/02/2014 1152   CREATININE 0.65 02/15/2024 1035   CALCIUM 9.2 02/15/2024 1035   CALCIUM 8.8 01/02/2014 1152   GFRNONAA >60 02/15/2023 0609   GFRNONAA >60 01/02/2014 1152   GFRAA >60 02/23/2019 1741   GFRAA >60 01/02/2014 1152    Lipid Panel     Component Value Date/Time   CHOL 154 02/15/2024 1035   TRIG 159 (H) 02/15/2024 1035   HDL 41 (L) 02/15/2024 1035   CHOLHDL 3.8 02/15/2024 1035   VLDL 15.0 09/18/2020 1108   LDLCALC 87 02/15/2024 1035    CBC    Component Value Date/Time   WBC 9.1 02/15/2024 1035   RBC 4.46 02/15/2024 1035   HGB 13.0 02/15/2024 1035   HGB 14.5 01/02/2014 1152   HCT 40.0 02/15/2024 1035   HCT 43.6 01/02/2014 1152   PLT 322 02/15/2024 1035   PLT 280 01/02/2014 1152   MCV 89.7 02/15/2024 1035   MCV 90 01/02/2014 1152   MCH 29.1 02/15/2024 1035   MCHC 32.5 02/15/2024 1035   RDW 12.9 02/15/2024 1035   RDW 12.7 01/02/2014 1152   LYMPHSABS 2.2 02/14/2023 1845   LYMPHSABS 2.4 01/02/2014 1152   MONOABS 1.1 (H) 02/14/2023 1845   MONOABS 0.7 01/02/2014 1152   EOSABS 0.0 02/14/2023 1845   EOSABS 0.4 01/02/2014 1152   BASOSABS 0.1 02/14/2023 1845   BASOSABS 0.1 01/02/2014 1152    Hgb A1C Lab Results  Component Value Date   HGBA1C 5.5 02/15/2024       Assessment and Plan: Assessment & Plan  Migraine, unspecified, not intractable, without status migrainosus Migraine episode with photophobia, phonophobia, and vomiting resolved. Stress may have contributed. Fioricet  provided partial  relief. - Provide work note for missed day due to migraine. - She reports she has plenty of refills on her fioricet  at  this time  RTC in 4 months for followup chronic conditions  Follow Up Instructions:    I discussed the assessment and treatment plan with the patient. The patient was provided an opportunity to ask questions and all were answered. The patient agreed with the plan and demonstrated an understanding of the instructions.   The patient was advised to call back or seek an in-person evaluation if the symptoms worsen or if the condition fails to improve as anticipated.   Angeline Laura, NP

## 2024-04-16 NOTE — Patient Instructions (Signed)

## 2024-05-12 ENCOUNTER — Encounter (HOSPITAL_BASED_OUTPATIENT_CLINIC_OR_DEPARTMENT_OTHER): Payer: Self-pay | Admitting: Emergency Medicine

## 2024-05-12 ENCOUNTER — Emergency Department (HOSPITAL_BASED_OUTPATIENT_CLINIC_OR_DEPARTMENT_OTHER)
Admission: EM | Admit: 2024-05-12 | Discharge: 2024-05-12 | Disposition: A | Discharge status: Home / Self Care | Attending: Emergency Medicine | Admitting: Emergency Medicine

## 2024-05-12 DIAGNOSIS — W57XXXA Bitten or stung by nonvenomous insect and other nonvenomous arthropods, initial encounter: Secondary | ICD-10-CM | POA: Insufficient documentation

## 2024-05-12 DIAGNOSIS — L089 Local infection of the skin and subcutaneous tissue, unspecified: Secondary | ICD-10-CM | POA: Insufficient documentation

## 2024-05-12 DIAGNOSIS — Z23 Encounter for immunization: Secondary | ICD-10-CM | POA: Diagnosis not present

## 2024-05-12 DIAGNOSIS — S30861A Insect bite (nonvenomous) of abdominal wall, initial encounter: Secondary | ICD-10-CM | POA: Diagnosis present

## 2024-05-12 MED ORDER — DOXYCYCLINE HYCLATE 100 MG PO TABS
100.0000 mg | ORAL_TABLET | Freq: Once | ORAL | Status: AC
  Administered 2024-05-12: 100 mg via ORAL
  Filled 2024-05-12: qty 1

## 2024-05-12 MED ORDER — IBUPROFEN 600 MG PO TABS: 600.0000 mg | ORAL_TABLET | Freq: Four times a day (QID) | ORAL | 0 refills | Status: DC | PRN

## 2024-05-12 MED ORDER — DOXYCYCLINE HYCLATE 100 MG PO CAPS: 100.0000 mg | ORAL_CAPSULE | Freq: Two times a day (BID) | ORAL | 0 refills | Status: DC

## 2024-05-12 MED ORDER — TETANUS-DIPHTH-ACELL PERTUSSIS 5-2.5-18.5 LF-MCG/0.5 IM SUSY
0.5000 mL | PREFILLED_SYRINGE | Freq: Once | INTRAMUSCULAR | Status: AC
  Administered 2024-05-12: 0.5 mL via INTRAMUSCULAR
  Filled 2024-05-12: qty 0.5

## 2024-05-12 NOTE — Discharge Instructions (Signed)
 You are likely having an infected insect bite.  Please take antibiotic and ibuprofen  as prescribed for the full duration.  Apply warm moist compress to affected area to aid with healing.  If your condition worsens or if you have any concern please return.

## 2024-05-12 NOTE — ED Triage Notes (Signed)
 Notice yesterday Right hip area   I think I was bit by a spider Worse today

## 2024-05-12 NOTE — ED Provider Notes (Signed)
 [place] EMERGENC[facility] DEPARTMENT AT [facility] Provider Note   CSN: [id number] Arrival date & time: 05/12/24  2117     Patient presents with: Insect Bite   Mikayla Briggs is a 43 y.o. female.   The history is provided by the patient and medical records. No language interpreter was used.     43 year old female presenting with concerns of a spider bite.  Patient states she works for [employer] and wear a utility belt on a regular basis.  She noticed some irritation to her right lower abdomen which was rubbing against the utility belt for the past 2 days.  She inspected skin and noticed that there is a bump there which has been increasing in redness and has drained out some purulent discharge.  She worries that this could be a spider bite.  She does not endorse any fever no nausea or vomiting and no recent tick bite.  Prior to Admission medications   Medication Sig Start Date End Date Taking? Authorizing Provider  amitriptyline  (ELAVIL ) 25 MG tablet Take 1 tablet (25 mg total) by mouth at bedtime. 02/23/24   Antonette Angeline ORN, NP  Butalbital -APAP-Caffeine  50-300-40 MG CAPS Take 1 capsule by mouth every 4 (four) hours as needed.    [provider]  cetirizine  (ZYRTEC ) 10 MG chewable tablet Chew 10 mg by mouth daily.    [provider]  Drospirenone  (SLYND ) 4 MG TABS Take 4 mg by mouth at bedtime. 01/24/23   [provider]  levocetirizine (XYZAL ) 5 MG tablet Take 1 tablet by mouth daily in the evening 02/23/24   Antonette Angeline ORN, NP  montelukast  (SINGULAIR ) 10 MG tablet Take 1 tablet (10 mg total) by mouth at bedtime. 02/23/24   Antonette Angeline ORN, NP  phentermine  (ADIPEX-P ) 37.5 MG tablet Take 37.5 mg by mouth daily before breakfast.    [provider]  Vitamins/Minerals TABS Take by mouth.    [provider]    Allergies: Iodine, Ivp dye [iodinated contrast media], Omeprazole , Penicillins, Shellfish allergy, Almond  (diagnostic), and Soap    Review of Systems  Constitutional:  Negative for fever.  Skin:  Positive for rash.    Updated Vital Signs BP (!) 143/95 (BP Location: Right Arm)   Pulse 100   Temp 97.7 F (36.5 C) (Oral)   Resp 20   SpO2 100%   Physical Exam Vitals and nursing note reviewed.  Constitutional:      General: She is not in acute distress.    Appearance: She is well-developed. She is obese.  HENT:     Head: Atraumatic.  Eyes:     Conjunctiva/sclera: Conjunctivae normal.  Pulmonary:     Effort: Pulmonary effort is normal.  Musculoskeletal:     Cervical back: Neck supple.  Skin:    Findings: Erythema (To the right lower abdominal pannus there is an area of erythema and warmth (6cm) with a punctate localized irritation noted.  No significant fluctuant appreciate. area tender to palpation) present. No rash.  Neurological:     Mental Status: She is alert.  Psychiatric:        Mood and Affect: Mood normal.     (all labs ordered are listed, but only abnormal results are displayed) Labs Reviewed - No data to display  EKG: None  Radiology: No results found.   Procedures   Medications Ordered in the ED  Tdap (BOOSTRIX ) injection 0.5 mL (0.5 mLs Intramuscular Given 05/12/24 2239)  doxycycline  (VIBRA -TABS) tablet 100 mg (100  mg Oral Given 05/12/24 2239)                                    Medical Decision Making Risk Prescription drug management.   BP (!) 143/95 (BP Location: Right Arm)   Pulse 100   Temp 97.7 F (36.5 C) (Oral)   Resp 20   SpO2 100%   65:25 PM  43 year old female presenting with concerns of a spider bite.  Patient states she works for [employer] and wear a utility belt on a regular basis.  She noticed some irritation to her right lower abdomen which was rubbing against the utility belt for the past 2 days.  She inspected skin and noticed that there is a bump there which has been increasing in redness and has drained out some purulent  discharge.  She worries that this could be a spider bite.  She does not endorse any fever no nausea or vomiting and no recent tick bite.  Exam notable for cellulitis involving the right lower pannus without obvious abscess amenable for I&D.  Area is tender to palpation.  Measure approximately 6 cm x 2 cm going horizontally.  Will treat with doxycycline  and recommend warm compress to affected area as well as return precautions.  No systemic involvement.  Will update Tdap  I have ordered pregnancy test as patient will receive doxycycline  antibiotic but patient declined stating that she is currently not pregnant.     Final diagnoses:  Infected insect bite of abdomen, initial encounter    ED Discharge Orders          Ordered    doxycycline  (VIBRAMYCIN ) 100 MG capsule  2 times daily        05/12/24 2243    ibuprofen  (ADVIL ) 600 MG tablet  Every 6 hours PRN        05/12/24 2243               Nivia Colon, PA-C 05/12/24 2244    Pamella Ozell LABOR, DO 05/18/24 1143

## 2024-06-12 ENCOUNTER — Encounter: Payer: Self-pay | Admitting: Internal Medicine

## 2024-06-12 ENCOUNTER — Telehealth: Payer: Self-pay

## 2024-06-12 NOTE — Telephone Encounter (Signed)
 Copied from CRM [id number]. Topic: General - Other >> Jun 12, 2024  9:34 AM [name] wrote: Reason for CRM: Patient called.. I did adv her we got her attached docs in [place] - she said needs it done today because of an insurance deadline.. Fax number is on the front page: [phone].Mikayla Briggs

## 2024-06-12 NOTE — Telephone Encounter (Signed)
 Forms signed. I will fax to provided fax number.

## 2024-07-04 ENCOUNTER — Ambulatory Visit: Payer: Self-pay

## 2024-07-04 NOTE — Telephone Encounter (Signed)
 Agree, not appropriate for virtual visit

## 2024-07-04 NOTE — Telephone Encounter (Signed)
 FYI Only or Action Required?: Action required by provider: request for appointment.  Patient was last seen in primary care on 04/16/2024 by Antonette Angeline ORN, NP.  Called Nurse Triage reporting Foot Pain.  Symptoms began several weeks ago.  Interventions attempted: Nothing.  Symptoms are: unchanged.  Triage Disposition: See PCP When Office is Open (Within 3 Days)  Patient/caregiver understands and will follow disposition?: No, refuses disposition   Copied from CRM #[id number]. Topic: Clinical - Red Word Triage >> Jul 04, 2024  9:05 AM Mia F wrote: Red Word that prompted transfer to Nurse Triage: Pain in foot. Not sure if she broke it or not. Has been going on for 2-3 weeks. Pain is getting worse. When touching it she feel an ice cold nerve pain. A little swelling. Not much discoloration. Reason for Disposition  [1] MODERATE pain (e.g., interferes with normal activities, limping) AND [2] present > 3 days  Answer Assessment - Initial Assessment Questions Patient called with c/o right foot pain, no apparent injury noted, just started hurting 2-3 weeks ago. Felt a lump/knot on the bottom of the foot, tingling/cool sensation when touched. She says she doesn't remember any injury, it just started hurting. She states she will need a virtual visit due to working and not off until on Monday. I advised an in person would be best so that the provider can touch to foot. She says she will have to call back when she can figure out a time. Advised [facility], she declined saying she doesn't like them and her ortho is [facility]. I asked did she call them, she says no due to her work schedule. She says she will call back later, then hung up the phone before I could give any care advice.   1. ONSET: When did the pain start?      2-3 weeks  2. LOCATION: Where is the pain located?      Right foot  3. PAIN: How bad is the pain?    (Scale 1-10; or mild, moderate, severe)     5-6 with shoes on, 10  without shoes  4. WORK OR EXERCISE: Has there been any recent work or exercise that involved this part of the body?      No  5. CAUSE: What do you think is causing the foot pain?     Unsure  6. OTHER SYMPTOMS: Do you have any other symptoms? (e.g., leg pain, rash, fever, numbness)     Some swelling to the foot on the outer part  Protocols used: Foot Pain-A-AH

## 2024-07-22 ENCOUNTER — Emergency Department (HOSPITAL_BASED_OUTPATIENT_CLINIC_OR_DEPARTMENT_OTHER)

## 2024-07-22 ENCOUNTER — Encounter (HOSPITAL_BASED_OUTPATIENT_CLINIC_OR_DEPARTMENT_OTHER): Payer: Self-pay | Admitting: Emergency Medicine

## 2024-07-22 ENCOUNTER — Emergency Department (HOSPITAL_BASED_OUTPATIENT_CLINIC_OR_DEPARTMENT_OTHER)
Admission: EM | Admit: 2024-07-22 | Discharge: 2024-07-22 | Disposition: A | Discharge status: Home / Self Care | Attending: Emergency Medicine | Admitting: Emergency Medicine

## 2024-07-22 ENCOUNTER — Other Ambulatory Visit: Payer: Self-pay

## 2024-07-22 DIAGNOSIS — Y990 Civilian activity done for income or pay: Secondary | ICD-10-CM | POA: Insufficient documentation

## 2024-07-22 DIAGNOSIS — S61451A Open bite of right hand, initial encounter: Secondary | ICD-10-CM | POA: Diagnosis not present

## 2024-07-22 DIAGNOSIS — S6991XA Unspecified injury of right wrist, hand and finger(s), initial encounter: Secondary | ICD-10-CM | POA: Diagnosis present

## 2024-07-22 DIAGNOSIS — W503XXA Accidental bite by another person, initial encounter: Secondary | ICD-10-CM | POA: Diagnosis not present

## 2024-07-22 LAB — RAPID HIV SCREEN (HIV 1/2 AB+AG)
HIV 1/2 Antibodies: NONREACTIVE
HIV-1 P24 Antigen - HIV24: NONREACTIVE

## 2024-07-22 MED ORDER — IBUPROFEN 800 MG PO TABS: 800.0000 mg | ORAL_TABLET | Freq: Three times a day (TID) | ORAL | 0 refills | Status: AC

## 2024-07-22 MED ORDER — OXYCODONE-ACETAMINOPHEN 5-325 MG PO TABS
1.0000 | ORAL_TABLET | Freq: Once | ORAL | Status: AC
  Administered 2024-07-22: 1 via ORAL
  Filled 2024-07-22: qty 1

## 2024-07-22 MED ORDER — CIPROFLOXACIN HCL 500 MG PO TABS: 500.0000 mg | ORAL_TABLET | Freq: Two times a day (BID) | ORAL | 0 refills | Status: AC

## 2024-07-22 MED ORDER — CLINDAMYCIN HCL 300 MG PO CAPS: 300.0000 mg | ORAL_CAPSULE | Freq: Three times a day (TID) | ORAL | 0 refills | Status: AC

## 2024-07-22 NOTE — ED Notes (Signed)
 Urine sample sent to lab

## 2024-07-22 NOTE — Discharge Instructions (Signed)
 Thank you for visiting the Emergency Department today. It was a pleasure to be part of your healthcare team.  Your imaging showed no acute findings. You have been prescribed an antibiotic regimen, please finish the entire course. You should take your medications as directed. If you have any questions about your medicines, please call your pharmacy or healthcare provider. Thank you for trusting us  with your health.

## 2024-07-22 NOTE — ED Triage Notes (Signed)
 Pt via pov from work; [place] [facility]. Bleeding controlled. Pt a&o x 4; nad noted.
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
## 2024-07-23 LAB — HEPATITIS PANEL, ACUTE
HCV Ab: NONREACTIVE
Hep A IgM: NONREACTIVE
Hep B C IgM: NONREACTIVE
Hepatitis B Surface Ag: NONREACTIVE

## 2024-08-20 ENCOUNTER — Ambulatory Visit: Admitting: Internal Medicine

## 2024-08-20 ENCOUNTER — Encounter: Payer: Self-pay | Admitting: Internal Medicine

## 2024-08-20 VITALS — BP 138/82 | Ht 67.0 in | Wt 245.0 lb

## 2024-08-20 DIAGNOSIS — Z23 Encounter for immunization: Secondary | ICD-10-CM | POA: Diagnosis not present

## 2024-08-20 DIAGNOSIS — F418 Other specified anxiety disorders: Secondary | ICD-10-CM | POA: Diagnosis not present

## 2024-08-20 DIAGNOSIS — E785 Hyperlipidemia, unspecified: Secondary | ICD-10-CM | POA: Insufficient documentation

## 2024-08-20 DIAGNOSIS — E781 Pure hyperglyceridemia: Secondary | ICD-10-CM

## 2024-08-20 DIAGNOSIS — Z6838 Body mass index (BMI) 38.0-38.9, adult: Secondary | ICD-10-CM

## 2024-08-20 DIAGNOSIS — E6609 Other obesity due to excess calories: Secondary | ICD-10-CM

## 2024-08-20 DIAGNOSIS — E66812 Obesity, class 2: Secondary | ICD-10-CM

## 2024-08-20 DIAGNOSIS — Z9109 Other allergy status, other than to drugs and biological substances: Secondary | ICD-10-CM

## 2024-08-20 DIAGNOSIS — G43019 Migraine without aura, intractable, without status migrainosus: Secondary | ICD-10-CM

## 2024-08-20 NOTE — Assessment & Plan Note (Signed)
 Encouraged diet and exercise for weight loss ?

## 2024-08-20 NOTE — Assessment & Plan Note (Signed)
Not medicated Support offered 

## 2024-08-20 NOTE — Assessment & Plan Note (Signed)
 Continue amitriptyline  25 mg daily, she declines increasing to 50 mg at this time Continue fioricet  50-300-40 mg daily prn Will monitor

## 2024-08-20 NOTE — Progress Notes (Signed)
 Subjective:    Patient ID: Mikayla Briggs, female    DOB: December 24, 1980, 43 y.o.   MRN: [id number]  HPI  Patient presents to clinic today for follow-up of chronic conditions.  Migraines: These occur 1-2 times per month but she has been having mild headaches daily.  Triggered by stress, allergies.  She is taking amitriptyline  as prescribed.  She takes fioricet  as needed for breakthrough.  She does not follow with neurology.  Anxiety: Situational.  She is not currently taking any medications for this at this time.  She is not currently seeing a therapist.  She denies depression, SI/HI.  Environmental allergies: Chronic, managed on singulair , xyzal  and zyrtec .  She does  not follow with an allergist.  HLD: Her last LDL was 87, triglycerides 840, 02/2024.  She is not taking any cholesterol-lowering medication at this time.  She tries to consume a low-fat diet.  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 90 tablet 1   Butalbital -APAP-Caffeine  50-300-40 MG CAPS Take 1 capsule by mouth every 4 (four) hours as needed.     cetirizine  (ZYRTEC ) 10 MG chewable tablet Chew 10 mg by mouth daily.     Drospirenone  (SLYND ) 4 MG TABS Take 4 mg by mouth at bedtime.     levocetirizine (XYZAL ) 5 MG tablet Take 1 tablet by mouth daily in the evening 90 tablet 1   montelukast  (SINGULAIR ) 10 MG tablet Take 1 tablet (10 mg total) by mouth at bedtime. 90 tablet 1   phentermine  (ADIPEX-P ) 37.5 MG tablet Take 37.5 mg by mouth daily before breakfast.     Vitamins/Minerals TABS Take by mouth.     No  current facility-administered medications for this visit.    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 Updated 12/30/22 after s/w pt.    Omeprazole  Anaphylaxis and Shortness Of Breath   Penicillins Anaphylaxis and Shortness Of Breath    Has patient had a PCN reaction causing immediate rash, facial/tongue/throat swelling, SOB or lightheadedness with hypotension: Yes Has patient had a PCN reaction causing severe rash involving mucus membranes or skin necrosis: No Has patient had a PCN reaction that required hospitalization: Unknown Has patient had a PCN reaction occurring within the last 10 years: No If all of the above answers are NO, then may proceed with Cephalosporin use.    Shellfish Allergy Anaphylaxis and Shortness Of Breath   Almond (Diagnostic)    Soap Itching    ANYTHING WITH FRAGRANCE    Family History  Problem Relation Age of Onset   Arthritis Mother    Ovarian cancer Mother    Hyperlipidemia Mother    Hypertension Mother    Irritable bowel syndrome Mother    Arthritis Father    Hypertension Father    Asthma Father  Asthma Sister    Arthritis Paternal Grandmother     Social History   Socioeconomic History   Marital status: Divorced    Spouse name: Lonnie   Number of children: 0   Years of education: Not on file   Highest education level: Some college, no degree  Occupational History   Occupation: CNA/ CPT  Tobacco Use   Smoking status: Never   Smokeless tobacco: Never  Vaping Use   Vaping status: Never Used  Substance and Sexual Activity   Alcohol use: Not Currently    Alcohol/week: 0.0 standard drinks of alcohol    Comment: occasionally   Drug use: No   Sexual activity: Yes    Birth control/protection: Pill  Other Topics Concern   Not on file  Social History  Narrative   Not on file   Social Drivers of Health   Financial Resource Strain: Low Risk  (04/16/2024)   Overall Financial Resource Strain (CARDIA)    Difficulty of Paying Living Expenses: Not hard at all  Food Insecurity: Unknown (04/16/2024)   Hunger Vital Sign    Worried About Running Out of Food in the Last Year: Never true    Ran Out of Food in the Last Year: Patient declined  Transportation Needs: No Transportation Needs (04/16/2024)   PRAPARE - Administrator, Civil Service (Medical): No    Lack of Transportation (Non-Medical): No  Physical Activity: Unknown (04/16/2024)   Exercise Vital Sign    Days of Exercise per Week: 5 days    Minutes of Exercise per Session: Patient declined  Stress: No Stress Concern Present (04/16/2024)   [employer] of Occupational Health - Occupational Stress Questionnaire    Feeling of Stress: Only a little  Social Connections: Unknown (04/16/2024)   Social Connection and Isolation Panel    Frequency of Communication with Friends and Family: More than three times a week    Frequency of Social Gatherings with Friends and Family: Patient declined    Attends Religious Services: Patient declined    Database Administrator or Organizations: Patient declined    Attends Banker Meetings: Not on file    Marital Status: Patient declined  Intimate Partner Violence: Not At Risk (08/17/2023)   Humiliation, Afraid, Rape, and Kick questionnaire    Fear of Current or Ex-Partner: No    Emotionally Abused: No    Physically Abused: No    Sexually Abused: No     Constitutional: Patient reports intermittent headaches.  Denies fever, malaise, fatigue, or abrupt weight changes.  HEENT: Denies eye pain, eye redness, ear pain, ringing in the ears, wax buildup, runny nose, nasal congestion, bloody nose, or sore throat. Respiratory: Denies difficulty breathing, shortness of breath, cough or sputum production.   Cardiovascular: Denies chest pain,  chest tightness, palpitations or swelling in the hands or feet.  Gastrointestinal: Denies abdominal pain, bloating, constipation, diarrhea or blood in the stool.  GU: Denies urgency, frequency, pain with urination, burning sensation, blood in urine, odor or discharge. Musculoskeletal: Denies decrease in range of motion, difficulty with gait, or joint swelling.  Skin: Denies redness, rashes, lesions or ulcercations.  Neurological: Denies dizziness, difficulty with memory, difficulty with speech or problems with balance and coordination.  Psych: Patient has a history of anxiety.  Denies depression, SI/HI.  No other specific complaints in a complete review of systems (except as listed in HPI above).  Objective:   Physical Exam  BP 138/82 (BP Location: Left Arm, Patient Position:  Sitting, Cuff Size: Large)   Ht 5' 7 (1.702 m)   Wt 245 lb (111.1 kg)   LMP 08/08/2024 (Approximate)   BMI 38.37 kg/m     Wt Readings from Last 3 Encounters:  07/22/24 239 lb 6.7 oz (108.6 kg)  02/15/24 239 lb 6.4 oz (108.6 kg)  08/17/23 239 lb (108.4 kg)    General: Appears her stated age, obese, in NAD. Skin: Warm, dry and intact.  HEENT: Head: normal shape and size; Eyes: sclera white, no icterus, conjunctiva pink, PERRLA and EOMs intact;  Cardiovascular: Normal rate and rhythm. S1,S2 noted.  No murmur, rubs or gallops noted.  Pulmonary/Chest: Normal effort and positive vesicular breath sounds. No respiratory distress. No wheezes, rales or ronchi noted.  Musculoskeletal: No difficulty with gait.  Neurological: Alert and oriented.  Psychiatric: Mood and affect normal. Behavior is normal. Judgment and thought content normal.   BMET    Component Value Date/Time   NA 139 02/15/2024 1035   NA 137 01/02/2014 1152   K 4.3 02/15/2024 1035   K 3.9 01/02/2014 1152   CL 105 02/15/2024 1035   CL 102 01/02/2014 1152   CO2 27 02/15/2024 1035   CO2 26 01/02/2014 1152   GLUCOSE 91 02/15/2024 1035   GLUCOSE  105 (H) 01/02/2014 1152   BUN 6 (L) 02/15/2024 1035   BUN 9 01/02/2014 1152   CREATININE 0.65 02/15/2024 1035   CALCIUM 9.2 02/15/2024 1035   CALCIUM 8.8 01/02/2014 1152   GFRNONAA >60 02/15/2023 0609   GFRNONAA >60 01/02/2014 1152   GFRAA >60 02/23/2019 1741   GFRAA >60 01/02/2014 1152    Lipid Panel     Component Value Date/Time   CHOL 154 02/15/2024 1035   TRIG 159 (H) 02/15/2024 1035   HDL 41 (L) 02/15/2024 1035   CHOLHDL 3.8 02/15/2024 1035   VLDL 15.0 09/18/2020 1108   LDLCALC 87 02/15/2024 1035    CBC    Component Value Date/Time   WBC 9.1 02/15/2024 1035   RBC 4.46 02/15/2024 1035   HGB 13.0 02/15/2024 1035   HGB 14.5 01/02/2014 1152   HCT 40.0 02/15/2024 1035   HCT 43.6 01/02/2014 1152   PLT 322 02/15/2024 1035   PLT 280 01/02/2014 1152   MCV 89.7 02/15/2024 1035   MCV 90 01/02/2014 1152   MCH 29.1 02/15/2024 1035   MCHC 32.5 02/15/2024 1035   RDW 12.9 02/15/2024 1035   RDW 12.7 01/02/2014 1152   LYMPHSABS 2.2 02/14/2023 1845   LYMPHSABS 2.4 01/02/2014 1152   MONOABS 1.1 (H) 02/14/2023 1845   MONOABS 0.7 01/02/2014 1152   EOSABS 0.0 02/14/2023 1845   EOSABS 0.4 01/02/2014 1152   BASOSABS 0.1 02/14/2023 1845   BASOSABS 0.1 01/02/2014 1152    Hgb A1C Lab Results  Component Value Date   HGBA1C 5.5 02/15/2024           Assessment & Plan:     RTC in 6 months for your annual exam Angeline Laura, NP

## 2024-08-20 NOTE — Assessment & Plan Note (Signed)
 Continue singulair  10 mg daily, levocetirizine 5 mg and cetirizine  10 mg daily Will monitor

## 2024-08-20 NOTE — Patient Instructions (Signed)
 General Headache Without Cause A headache is pain or discomfort you feel around the head or neck area. There are many causes and types of headaches. In some cases, the cause may not be found. Follow these instructions at home: Watch your condition for any changes. Let your doctor know about them. Take these steps to help with your condition: Managing pain     Take over-the-counter and prescription medicines only as told by your doctor. This includes medicines for pain that are taken by mouth or put on the skin. Lie down in a dark, quiet room when you have a headache. If told, put ice on your head and neck area: Put ice in a plastic bag. Place a towel between your skin and the bag. Leave the ice on for 20 minutes, 2-3 times per day. Take off the ice if your skin turns bright red. This is very important. If you cannot feel pain, heat, or cold, you have a greater risk of damage to the area. If told, put heat on the affected area. Use the heat source that your doctor recommends, such as a moist heat pack or a heating pad. Place a towel between your skin and the heat source. Leave the heat on for 20-30 minutes. Take off the heat if your skin turns bright red. This is very important. If you cannot feel pain, heat, or cold, you have a greater risk of getting burned. Keep lights dim if bright lights bother you or make your headaches worse. Eating and drinking Eat meals on a regular schedule. If you drink alcohol: Limit how much you have to: 0-1 drink a day for women who are not pregnant. 0-2 drinks a day for men. Know how much alcohol is in a drink. In the [place] one drink equals one 12 oz bottle of beer (355 mL), one 5 oz glass of wine (148 mL), or one 1 oz glass of hard liquor (44 mL). Stop drinking caffeine, or drink less caffeine. General instructions  Keep a journal to find out if certain things bring on headaches. For example, write down: What you eat and drink. How much sleep you  get. Any change to your diet or medicines. Get a massage or try other ways to relax. Limit stress. Sit up straight. Do not tighten (tense) your muscles. Do not smoke or use any products that contain nicotine or tobacco. If you need help quitting, ask your doctor. Exercise regularly as told by your doctor. Get enough sleep. This often means 7-9 hours of sleep each night. Keep all follow-up visits. This is important. Contact a doctor if: Medicine does not help your symptoms. You have a headache that feels different than the other headaches. You feel like you may vomit (nauseous) or you vomit. You have a fever. Get help right away if: Your headache: Gets very bad quickly. Gets worse after a lot of physical activity. You have any of these symptoms: You continue to vomit. A stiff neck. Trouble seeing. Your eye or ear hurts. Trouble speaking. Weak muscles or you lose muscle control. You lose your balance or have trouble walking. You feel like you will pass out (faint) or you pass out. You are mixed up (confused). You have a seizure. These symptoms may be an emergency. Get help right away. Call your local emergency services (911 in the [place] Do not wait to see if the symptoms will go away. Do not drive yourself to the hospital. Summary A headache is pain or discomfort that  is felt around the head or neck area. There are many causes and types of headaches. In some cases, the cause may not be found. Keep a journal to help find out what causes your headaches. Watch your condition for any changes. Let your doctor know about them. Contact a doctor if you have a headache that is different from usual, or if medicine does not help your headache. Get help right away if your headache gets very bad, you throw up, you have trouble seeing, you lose your balance, or you have a seizure. This information is not intended to replace advice given to you by your health care provider. Make sure you  discuss any questions you have with your health care provider. Document Revised: 01/28/2021 Document Reviewed: 01/28/2021 Elsevier Patient Education  2024 [employer].

## 2024-08-20 NOTE — Assessment & Plan Note (Signed)
Lipid profile reviewed Encouraged her to consume a low-fat diet

## 2024-08-21 ENCOUNTER — Ambulatory Visit
Admission: RE | Admit: 2024-08-21 | Discharge: 2024-08-21 | Disposition: A | Source: Ambulatory Visit | Attending: Obstetrics and Gynecology | Admitting: Obstetrics and Gynecology

## 2024-08-21 ENCOUNTER — Encounter

## 2024-08-21 ENCOUNTER — Other Ambulatory Visit: Payer: Self-pay | Admitting: Obstetrics and Gynecology

## 2024-08-21 DIAGNOSIS — Z1231 Encounter for screening mammogram for malignant neoplasm of breast: Secondary | ICD-10-CM

## 2024-08-31 ENCOUNTER — Other Ambulatory Visit: Payer: Self-pay | Admitting: Internal Medicine

## 2024-08-31 DIAGNOSIS — Z9109 Other allergy status, other than to drugs and biological substances: Secondary | ICD-10-CM

## 2024-09-04 NOTE — Telephone Encounter (Signed)
 Requested Prescriptions  Pending Prescriptions Disp Refills   montelukast  (SINGULAIR ) 10 MG tablet [Pharmacy Med Name: MONTELUKAST  SOD 10 MG TABLET] 90 tablet 1    Sig: Take 1 tablet by mouth once daily at bedtime     Pulmonology:  Leukotriene Inhibitors Passed - 09/04/2024 10:50 AM      Passed - Valid encounter within last 12 months    Recent Outpatient Visits           2 weeks ago Intractable migraine without aura and without status migrainosus   [place] [facility] [place], [place] W, NP   4 months ago Intractable migraine without aura and without status migrainosus   [place] [facility] [place], Angeline ORN, NP   6 months ago Encounter for general adult medical examination with abnormal findings   [place] [facility] [place], Angeline ORN, NP               amitriptyline  (ELAVIL ) 25 MG tablet [Pharmacy Med Name: AMITRIPTYLINE  HCL 25 MG TAB] 90 tablet 1    Sig: Take 1 tablet by mouth at bedtime     Psychiatry:  Antidepressants - Heterocyclics (TCAs) Passed - 09/04/2024 10:50 AM      Passed - Valid encounter within last 6 months    Recent Outpatient Visits           2 weeks ago Intractable migraine without aura and without status migrainosus   [place] [facility] [place], [place] W, NP   4 months ago Intractable migraine without aura and without status migrainosus   [place] [facility] [place], [place] W, NP   6 months ago Encounter for general adult medical examination with abnormal findings   [place] [facility] [place], Angeline ORN, NP               levocetirizine (XYZAL ) 5 MG tablet [Pharmacy Med Name: LEVOCETIRIZINE 5 MG TABLET] 90 tablet 1    Sig: Take 1 tablet by mouth once daily in the evening     Ear, Nose, and Throat:  Antihistamines - levocetirizine dihydrochloride  Passed - 09/04/2024 10:50 AM      Passed - Cr in normal range and within 360 days    Creat   Date Value Ref Range Status  02/15/2024 0.65 0.50 - 0.99 mg/dL Final   Creatinine, Urine  Date Value Ref Range Status  02/23/2019 15.47 mg/dL Final         Passed - eGFR is 10 or above and within 360 days    EGFR (African American)  Date Value Ref Range Status  01/02/2014 >60  Final   GFR calc Af Amer  Date Value Ref Range Status  02/23/2019 >60 >60 mL/min Final   EGFR (Non-African Amer.)  Date Value Ref Range Status  01/02/2014 >60  Final    Comment:    eGFR values <88mL/min/1.73 m2 may be an indication of chronic kidney disease (CKD). Calculated eGFR is useful in patients with stable renal function. The eGFR calculation will not be reliable in acutely ill patients when serum creatinine is changing rapidly. It is not useful in  patients on dialysis. The eGFR calculation may not be applicable to patients at the low and high extremes of body sizes, pregnant women, and vegetarians.    GFR, Estimated  Date Value Ref Range Status  02/15/2023 >60 >60 mL/min Final    Comment:    (NOTE) Calculated using the CKD-EPI  Creatinine Equation (2021)    GFR  Date Value Ref Range Status  09/18/2020 100.39 >60.00 mL/min Final    Comment:    Calculated using the CKD-EPI Creatinine Equation (2021)   eGFR  Date Value Ref Range Status  02/15/2024 113 > OR = 60 mL/min/1.18m2 Final         Passed - Valid encounter within last 12 months    Recent Outpatient Visits           2 weeks ago Intractable migraine without aura and without status migrainosus   [place] [facility] [facility] [place], [place] W, NP   4 months ago Intractable migraine without aura and without status migrainosus   [place] [facility] [place], Angeline ORN, NP   6 months ago Encounter for general adult medical examination with abnormal findings   [place] [facility] [place], Angeline ORN, NP

## 2025-02-18 ENCOUNTER — Encounter: Admitting: Internal Medicine
